# Patient Record
Sex: Male | Born: 1940
Health system: Southern US, Community
[De-identification: ages and names within clinical notes are randomized; demographics above are authoritative.]

## PROBLEM LIST (undated history)

## (undated) DIAGNOSIS — I10 Essential (primary) hypertension: Secondary | ICD-10-CM

## (undated) DIAGNOSIS — R7303 Prediabetes: Secondary | ICD-10-CM

## (undated) DIAGNOSIS — Z973 Presence of spectacles and contact lenses: Secondary | ICD-10-CM

## (undated) DIAGNOSIS — Z952 Presence of prosthetic heart valve: Secondary | ICD-10-CM

## (undated) DIAGNOSIS — Q231 Congenital insufficiency of aortic valve: Secondary | ICD-10-CM

## (undated) DIAGNOSIS — E785 Hyperlipidemia, unspecified: Secondary | ICD-10-CM

## (undated) DIAGNOSIS — C61 Malignant neoplasm of prostate: Secondary | ICD-10-CM

## (undated) DIAGNOSIS — Z972 Presence of dental prosthetic device (complete) (partial): Secondary | ICD-10-CM

## (undated) DIAGNOSIS — R238 Other skin changes: Secondary | ICD-10-CM

## (undated) DIAGNOSIS — I351 Nonrheumatic aortic (valve) insufficiency: Secondary | ICD-10-CM

## (undated) DIAGNOSIS — J189 Pneumonia, unspecified organism: Secondary | ICD-10-CM

## (undated) DIAGNOSIS — I2510 Atherosclerotic heart disease of native coronary artery without angina pectoris: Secondary | ICD-10-CM

## (undated) DIAGNOSIS — Z87898 Personal history of other specified conditions: Secondary | ICD-10-CM

## (undated) DIAGNOSIS — F419 Anxiety disorder, unspecified: Secondary | ICD-10-CM

## (undated) DIAGNOSIS — N400 Enlarged prostate without lower urinary tract symptoms: Secondary | ICD-10-CM

## (undated) DIAGNOSIS — N183 Chronic kidney disease, stage 3 unspecified: Secondary | ICD-10-CM

## (undated) DIAGNOSIS — I5032 Chronic diastolic (congestive) heart failure: Secondary | ICD-10-CM

## (undated) DIAGNOSIS — M549 Dorsalgia, unspecified: Secondary | ICD-10-CM

## (undated) DIAGNOSIS — I440 Atrioventricular block, first degree: Secondary | ICD-10-CM

## (undated) DIAGNOSIS — Z9889 Other specified postprocedural states: Secondary | ICD-10-CM

## (undated) DIAGNOSIS — I350 Nonrheumatic aortic (valve) stenosis: Secondary | ICD-10-CM

## (undated) DIAGNOSIS — Z86711 Personal history of pulmonary embolism: Secondary | ICD-10-CM

## (undated) DIAGNOSIS — R351 Nocturia: Secondary | ICD-10-CM

## (undated) DIAGNOSIS — M1990 Unspecified osteoarthritis, unspecified site: Secondary | ICD-10-CM

## (undated) DIAGNOSIS — R112 Nausea with vomiting, unspecified: Secondary | ICD-10-CM

## (undated) DIAGNOSIS — R05 Cough: Secondary | ICD-10-CM

## (undated) DIAGNOSIS — R51 Headache: Secondary | ICD-10-CM

## (undated) HISTORY — PX: COLONOSCOPY: SHX174

## (undated) HISTORY — DX: Hyperlipidemia, unspecified: E78.5

## (undated) HISTORY — PX: BLEPHAROPLASTY: SUR158

## (undated) HISTORY — PX: CARDIAC CATHETERIZATION: SHX172

## (undated) HISTORY — PX: AORTIC VALVE REPLACEMENT (AVR)/CORONARY ARTERY BYPASS GRAFTING (CABG): SHX5725

## (undated) HISTORY — PX: ESOPHAGOGASTRODUODENOSCOPY: SHX1529

## (undated) HISTORY — DX: Congenital insufficiency of aortic valve: Q23.1

## (undated) HISTORY — DX: Essential (primary) hypertension: I10

## (undated) HISTORY — DX: Nonrheumatic aortic (valve) insufficiency: I35.1

## (undated) HISTORY — PX: AORTIC VALVE REPLACEMENT: SHX41

## (undated) HISTORY — DX: Nonrheumatic aortic (valve) stenosis: I35.0

## (undated) HISTORY — PX: OTHER SURGICAL HISTORY: SHX169

## (undated) HISTORY — PX: CYST REMOVAL NECK: SHX6281

---

## 1998-07-04 ENCOUNTER — Ambulatory Visit (HOSPITAL_COMMUNITY): Admission: RE | Admit: 1998-07-04 | Discharge: 1998-07-04 | Payer: Self-pay | Admitting: *Deleted

## 1999-09-17 ENCOUNTER — Emergency Department (HOSPITAL_COMMUNITY): Admission: EM | Admit: 1999-09-17 | Discharge: 1999-09-17 | Payer: Self-pay | Admitting: Emergency Medicine

## 2000-09-12 ENCOUNTER — Encounter: Payer: Self-pay | Admitting: Urology

## 2000-09-12 ENCOUNTER — Encounter: Admission: RE | Admit: 2000-09-12 | Discharge: 2000-09-12 | Payer: Self-pay | Admitting: Urology

## 2001-06-22 ENCOUNTER — Ambulatory Visit (HOSPITAL_COMMUNITY): Admission: RE | Admit: 2001-06-22 | Discharge: 2001-06-22 | Payer: Self-pay | Admitting: Orthopedic Surgery

## 2001-06-22 ENCOUNTER — Encounter: Payer: Self-pay | Admitting: Orthopedic Surgery

## 2002-04-03 ENCOUNTER — Encounter: Payer: Self-pay | Admitting: Pulmonary Disease

## 2002-04-03 ENCOUNTER — Ambulatory Visit (HOSPITAL_COMMUNITY): Admission: RE | Admit: 2002-04-03 | Discharge: 2002-04-03 | Payer: Self-pay | Admitting: Pulmonary Disease

## 2003-08-15 ENCOUNTER — Ambulatory Visit (HOSPITAL_COMMUNITY): Admission: RE | Admit: 2003-08-15 | Discharge: 2003-08-15 | Payer: Self-pay | Admitting: Pulmonary Disease

## 2004-09-22 ENCOUNTER — Emergency Department (HOSPITAL_COMMUNITY): Admission: EM | Admit: 2004-09-22 | Discharge: 2004-09-23 | Payer: Self-pay | Admitting: Emergency Medicine

## 2005-02-05 ENCOUNTER — Ambulatory Visit (HOSPITAL_COMMUNITY): Admission: RE | Admit: 2005-02-05 | Discharge: 2005-02-05 | Payer: Self-pay | Admitting: Pulmonary Disease

## 2006-07-06 ENCOUNTER — Emergency Department (HOSPITAL_COMMUNITY): Admission: EM | Admit: 2006-07-06 | Discharge: 2006-07-06 | Payer: Self-pay | Admitting: Emergency Medicine

## 2009-02-06 ENCOUNTER — Ambulatory Visit (HOSPITAL_COMMUNITY): Admission: RE | Admit: 2009-02-06 | Discharge: 2009-02-06 | Payer: Self-pay | Admitting: Pulmonary Disease

## 2009-11-06 ENCOUNTER — Ambulatory Visit (HOSPITAL_COMMUNITY): Admission: RE | Admit: 2009-11-06 | Discharge: 2009-11-06 | Payer: Self-pay | Admitting: Pulmonary Disease

## 2010-06-29 ENCOUNTER — Ambulatory Visit (HOSPITAL_COMMUNITY)
Admission: RE | Admit: 2010-06-29 | Discharge: 2010-06-29 | Disposition: A | Discharge status: Home / Self Care | Payer: BC Managed Care – PPO | Source: Ambulatory Visit | Attending: Pulmonary Disease | Admitting: Pulmonary Disease

## 2010-06-29 ENCOUNTER — Other Ambulatory Visit: Payer: Self-pay | Admitting: Pulmonary Disease

## 2010-06-29 DIAGNOSIS — R52 Pain, unspecified: Secondary | ICD-10-CM

## 2010-06-29 DIAGNOSIS — M549 Dorsalgia, unspecified: Secondary | ICD-10-CM | POA: Insufficient documentation

## 2010-06-29 DIAGNOSIS — M51379 Other intervertebral disc degeneration, lumbosacral region without mention of lumbar back pain or lower extremity pain: Secondary | ICD-10-CM | POA: Insufficient documentation

## 2010-06-29 DIAGNOSIS — M5137 Other intervertebral disc degeneration, lumbosacral region: Secondary | ICD-10-CM | POA: Insufficient documentation

## 2010-07-12 ENCOUNTER — Emergency Department (HOSPITAL_COMMUNITY)
Admission: EM | Admit: 2010-07-12 | Discharge: 2010-07-12 | Disposition: A | Discharge status: Home / Self Care | Payer: Medicare Other | Attending: Emergency Medicine | Admitting: Emergency Medicine

## 2010-07-12 ENCOUNTER — Emergency Department (HOSPITAL_COMMUNITY): Payer: Medicare Other

## 2010-07-12 DIAGNOSIS — R51 Headache: Secondary | ICD-10-CM | POA: Insufficient documentation

## 2010-07-12 DIAGNOSIS — I10 Essential (primary) hypertension: Secondary | ICD-10-CM | POA: Insufficient documentation

## 2010-07-12 DIAGNOSIS — S0990XA Unspecified injury of head, initial encounter: Secondary | ICD-10-CM | POA: Insufficient documentation

## 2010-07-12 DIAGNOSIS — W108XXA Fall (on) (from) other stairs and steps, initial encounter: Secondary | ICD-10-CM | POA: Insufficient documentation

## 2010-07-12 DIAGNOSIS — M549 Dorsalgia, unspecified: Secondary | ICD-10-CM | POA: Insufficient documentation

## 2010-07-12 DIAGNOSIS — E785 Hyperlipidemia, unspecified: Secondary | ICD-10-CM | POA: Insufficient documentation

## 2010-07-12 DIAGNOSIS — Y92009 Unspecified place in unspecified non-institutional (private) residence as the place of occurrence of the external cause: Secondary | ICD-10-CM | POA: Insufficient documentation

## 2010-08-07 ENCOUNTER — Other Ambulatory Visit: Payer: Self-pay | Admitting: Orthopedic Surgery

## 2010-08-07 DIAGNOSIS — M549 Dorsalgia, unspecified: Secondary | ICD-10-CM

## 2010-08-10 ENCOUNTER — Ambulatory Visit
Admission: RE | Admit: 2010-08-10 | Discharge: 2010-08-10 | Disposition: A | Discharge status: Home / Self Care | Payer: Medicare Other | Source: Ambulatory Visit | Attending: Orthopedic Surgery | Admitting: Orthopedic Surgery

## 2010-08-10 DIAGNOSIS — M549 Dorsalgia, unspecified: Secondary | ICD-10-CM

## 2011-05-20 DIAGNOSIS — N401 Enlarged prostate with lower urinary tract symptoms: Secondary | ICD-10-CM | POA: Diagnosis not present

## 2011-05-20 DIAGNOSIS — N138 Other obstructive and reflux uropathy: Secondary | ICD-10-CM | POA: Diagnosis not present

## 2011-05-20 DIAGNOSIS — N529 Male erectile dysfunction, unspecified: Secondary | ICD-10-CM | POA: Diagnosis not present

## 2011-05-20 DIAGNOSIS — N498 Inflammatory disorders of other specified male genital organs: Secondary | ICD-10-CM | POA: Diagnosis not present

## 2011-05-24 DIAGNOSIS — I119 Hypertensive heart disease without heart failure: Secondary | ICD-10-CM | POA: Diagnosis not present

## 2011-05-24 DIAGNOSIS — Q251 Coarctation of aorta: Secondary | ICD-10-CM | POA: Diagnosis not present

## 2011-05-24 DIAGNOSIS — E7800 Pure hypercholesterolemia, unspecified: Secondary | ICD-10-CM | POA: Diagnosis not present

## 2011-05-24 DIAGNOSIS — Z23 Encounter for immunization: Secondary | ICD-10-CM | POA: Diagnosis not present

## 2011-06-11 DIAGNOSIS — M542 Cervicalgia: Secondary | ICD-10-CM | POA: Diagnosis not present

## 2011-06-11 DIAGNOSIS — M999 Biomechanical lesion, unspecified: Secondary | ICD-10-CM | POA: Diagnosis not present

## 2011-06-11 DIAGNOSIS — S239XXA Sprain of unspecified parts of thorax, initial encounter: Secondary | ICD-10-CM | POA: Diagnosis not present

## 2011-06-11 DIAGNOSIS — M9981 Other biomechanical lesions of cervical region: Secondary | ICD-10-CM | POA: Diagnosis not present

## 2011-06-14 ENCOUNTER — Other Ambulatory Visit (HOSPITAL_COMMUNITY): Payer: Self-pay | Admitting: Pulmonary Disease

## 2011-06-14 DIAGNOSIS — E7800 Pure hypercholesterolemia, unspecified: Secondary | ICD-10-CM | POA: Diagnosis not present

## 2011-06-14 DIAGNOSIS — I119 Hypertensive heart disease without heart failure: Secondary | ICD-10-CM | POA: Diagnosis not present

## 2011-06-14 DIAGNOSIS — Z79899 Other long term (current) drug therapy: Secondary | ICD-10-CM | POA: Diagnosis not present

## 2011-06-14 DIAGNOSIS — R1012 Left upper quadrant pain: Secondary | ICD-10-CM | POA: Diagnosis not present

## 2011-06-14 DIAGNOSIS — N400 Enlarged prostate without lower urinary tract symptoms: Secondary | ICD-10-CM

## 2011-06-14 DIAGNOSIS — R39198 Other difficulties with micturition: Secondary | ICD-10-CM | POA: Diagnosis not present

## 2011-06-14 DIAGNOSIS — M5137 Other intervertebral disc degeneration, lumbosacral region: Secondary | ICD-10-CM | POA: Diagnosis not present

## 2011-06-16 ENCOUNTER — Ambulatory Visit (HOSPITAL_COMMUNITY)
Admission: RE | Admit: 2011-06-16 | Discharge: 2011-06-16 | Disposition: A | Discharge status: Home / Self Care | Payer: Medicare Other | Source: Ambulatory Visit | Attending: Pulmonary Disease | Admitting: Pulmonary Disease

## 2011-06-16 ENCOUNTER — Encounter (HOSPITAL_COMMUNITY): Payer: Self-pay

## 2011-06-16 DIAGNOSIS — R1012 Left upper quadrant pain: Secondary | ICD-10-CM | POA: Diagnosis not present

## 2011-06-16 DIAGNOSIS — R109 Unspecified abdominal pain: Secondary | ICD-10-CM | POA: Diagnosis not present

## 2011-06-16 DIAGNOSIS — N400 Enlarged prostate without lower urinary tract symptoms: Secondary | ICD-10-CM | POA: Insufficient documentation

## 2011-06-16 MED ORDER — IOHEXOL 300 MG/ML  SOLN
75.0000 mL | Freq: Once | INTRAMUSCULAR | Status: AC | PRN
  Administered 2011-06-16: 75 mL via INTRAVENOUS

## 2011-06-18 DIAGNOSIS — M5412 Radiculopathy, cervical region: Secondary | ICD-10-CM | POA: Diagnosis not present

## 2011-06-18 DIAGNOSIS — M999 Biomechanical lesion, unspecified: Secondary | ICD-10-CM | POA: Diagnosis not present

## 2011-06-18 DIAGNOSIS — M9981 Other biomechanical lesions of cervical region: Secondary | ICD-10-CM | POA: Diagnosis not present

## 2011-06-18 DIAGNOSIS — M5137 Other intervertebral disc degeneration, lumbosacral region: Secondary | ICD-10-CM | POA: Diagnosis not present

## 2011-06-25 DIAGNOSIS — M5137 Other intervertebral disc degeneration, lumbosacral region: Secondary | ICD-10-CM | POA: Diagnosis not present

## 2011-06-25 DIAGNOSIS — M5412 Radiculopathy, cervical region: Secondary | ICD-10-CM | POA: Diagnosis not present

## 2011-06-25 DIAGNOSIS — M999 Biomechanical lesion, unspecified: Secondary | ICD-10-CM | POA: Diagnosis not present

## 2011-06-25 DIAGNOSIS — M9981 Other biomechanical lesions of cervical region: Secondary | ICD-10-CM | POA: Diagnosis not present

## 2011-07-13 DIAGNOSIS — I119 Hypertensive heart disease without heart failure: Secondary | ICD-10-CM | POA: Diagnosis not present

## 2011-07-13 DIAGNOSIS — S139XXA Sprain of joints and ligaments of unspecified parts of neck, initial encounter: Secondary | ICD-10-CM | POA: Diagnosis not present

## 2011-07-13 DIAGNOSIS — Z043 Encounter for examination and observation following other accident: Secondary | ICD-10-CM | POA: Diagnosis not present

## 2011-07-13 DIAGNOSIS — Z79899 Other long term (current) drug therapy: Secondary | ICD-10-CM | POA: Diagnosis not present

## 2011-07-14 DIAGNOSIS — I359 Nonrheumatic aortic valve disorder, unspecified: Secondary | ICD-10-CM | POA: Diagnosis not present

## 2011-07-21 DIAGNOSIS — E7800 Pure hypercholesterolemia, unspecified: Secondary | ICD-10-CM | POA: Diagnosis not present

## 2011-07-21 DIAGNOSIS — I359 Nonrheumatic aortic valve disorder, unspecified: Secondary | ICD-10-CM | POA: Diagnosis not present

## 2011-07-21 DIAGNOSIS — I059 Rheumatic mitral valve disease, unspecified: Secondary | ICD-10-CM | POA: Diagnosis not present

## 2011-07-21 DIAGNOSIS — I10 Essential (primary) hypertension: Secondary | ICD-10-CM | POA: Diagnosis not present

## 2011-07-22 ENCOUNTER — Other Ambulatory Visit (HOSPITAL_COMMUNITY): Payer: Self-pay | Admitting: Pulmonary Disease

## 2011-07-22 ENCOUNTER — Ambulatory Visit (HOSPITAL_COMMUNITY)
Admission: RE | Admit: 2011-07-22 | Discharge: 2011-07-22 | Disposition: A | Discharge status: Home / Self Care | Payer: Medicare Other | Source: Ambulatory Visit | Attending: Pulmonary Disease | Admitting: Pulmonary Disease

## 2011-07-22 DIAGNOSIS — R52 Pain, unspecified: Secondary | ICD-10-CM | POA: Diagnosis not present

## 2011-07-22 DIAGNOSIS — M5030 Other cervical disc degeneration, unspecified cervical region: Secondary | ICD-10-CM | POA: Diagnosis not present

## 2011-07-22 DIAGNOSIS — M47812 Spondylosis without myelopathy or radiculopathy, cervical region: Secondary | ICD-10-CM | POA: Insufficient documentation

## 2011-07-29 DIAGNOSIS — H04129 Dry eye syndrome of unspecified lacrimal gland: Secondary | ICD-10-CM | POA: Diagnosis not present

## 2011-08-11 ENCOUNTER — Other Ambulatory Visit: Payer: Self-pay | Admitting: Interventional Cardiology

## 2011-08-13 ENCOUNTER — Other Ambulatory Visit: Payer: Self-pay | Admitting: Cardiology

## 2011-08-13 DIAGNOSIS — I359 Nonrheumatic aortic valve disorder, unspecified: Secondary | ICD-10-CM | POA: Diagnosis not present

## 2011-08-13 DIAGNOSIS — E785 Hyperlipidemia, unspecified: Secondary | ICD-10-CM | POA: Diagnosis not present

## 2011-08-13 DIAGNOSIS — I10 Essential (primary) hypertension: Secondary | ICD-10-CM | POA: Diagnosis not present
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
## 2011-08-19 ENCOUNTER — Encounter (HOSPITAL_BASED_OUTPATIENT_CLINIC_OR_DEPARTMENT_OTHER)
Admission: RE | Disposition: A | Discharge status: Home / Self Care | Payer: Self-pay | Source: Ambulatory Visit | Attending: Interventional Cardiology

## 2011-08-19 ENCOUNTER — Inpatient Hospital Stay (HOSPITAL_BASED_OUTPATIENT_CLINIC_OR_DEPARTMENT_OTHER)
Admission: RE | Admit: 2011-08-19 | Discharge: 2011-08-19 | Disposition: A | Discharge status: Home / Self Care | Payer: Medicare Other | Source: Ambulatory Visit | Attending: Interventional Cardiology | Admitting: Interventional Cardiology

## 2011-08-19 DIAGNOSIS — I359 Nonrheumatic aortic valve disorder, unspecified: Secondary | ICD-10-CM | POA: Diagnosis not present

## 2011-08-19 DIAGNOSIS — I2510 Atherosclerotic heart disease of native coronary artery without angina pectoris: Secondary | ICD-10-CM | POA: Diagnosis not present

## 2011-08-19 LAB — POCT I-STAT 3, VENOUS BLOOD GAS (G3P V)
Bicarbonate: 27.6 mEq/L — ABNORMAL HIGH (ref 20.0–24.0)
pCO2, Ven: 54.2 mmHg — ABNORMAL HIGH (ref 45.0–50.0)
pH, Ven: 7.315 — ABNORMAL HIGH (ref 7.250–7.300)

## 2011-08-19 LAB — POCT I-STAT 3, ART BLOOD GAS (G3+)
pCO2 arterial: 52.5 mmHg — ABNORMAL HIGH (ref 35.0–45.0)
pO2, Arterial: 115 mmHg — ABNORMAL HIGH (ref 80.0–100.0)

## 2011-08-19 SURGERY — JV LEFT AND RIGHT HEART CATHETERIZATION WITH CORONARY ANGIOGRAM
Anesthesia: Moderate Sedation

## 2011-08-19 MED ORDER — SODIUM CHLORIDE 0.9 % IJ SOLN: 3.0000 mL | Freq: Two times a day (BID) | INTRAMUSCULAR | Status: DC

## 2011-08-19 MED ORDER — SODIUM CHLORIDE 0.9 % IV SOLN: 250.0000 mL | INTRAVENOUS | Status: DC | PRN

## 2011-08-19 MED ORDER — SODIUM CHLORIDE 0.9 % IV SOLN: INTRAVENOUS | Status: DC

## 2011-08-19 MED ORDER — ASPIRIN 81 MG PO CHEW
324.0000 mg | CHEWABLE_TABLET | ORAL | Status: AC
  Administered 2011-08-19: 324 mg via ORAL

## 2011-08-19 MED ORDER — ACETAMINOPHEN 325 MG PO TABS: 650.0000 mg | ORAL_TABLET | ORAL | Status: DC | PRN

## 2011-08-19 MED ORDER — SODIUM CHLORIDE 0.9 % IV SOLN
INTRAVENOUS | Status: DC
  Administered 2011-08-19: 08:00:00 via INTRAVENOUS

## 2011-08-19 MED ORDER — DIAZEPAM 5 MG PO TABS
5.0000 mg | ORAL_TABLET | ORAL | Status: AC
  Administered 2011-08-19: 5 mg via ORAL

## 2011-08-19 MED ORDER — SODIUM CHLORIDE 0.9 % IJ SOLN: 3.0000 mL | INTRAMUSCULAR | Status: DC | PRN

## 2011-08-19 MED ORDER — ONDANSETRON HCL 4 MG/2ML IJ SOLN: 4.0000 mg | Freq: Four times a day (QID) | INTRAMUSCULAR | Status: DC | PRN

## 2011-08-19 NOTE — H&P (Signed)
The patient has aortic stenosis that appears to have progressed based upon recent echo data and patient's clinical status. No changes have occurred since the history and physical was generated all 08/13/11. The procedure including the risk of stroke, death, myocardial infarction, kidney failure, bleeding, allergy, limb ischemia, among others were discussed and accepted by the patient.

## 2011-08-19 NOTE — CV Procedure (Signed)
     Diagnostic Cardiac Catheterization Report  [name]  71 y.o.  male Dec 22, 1940  Procedure Date:08/19/2011  Referring Physician: Corine Shelter, M.D. Primary Cardiologist:: Gwynneth Albright, M.D.   PROCEDURE:  Left and right heart catheterization with selective coronary angiography, left ventriculogram.  INDICATIONS:  Symptomatic aortic stenosis. The studies being done to define coronary anatomy prior to referral for consideration of surgery or TAVI. This study will also confirm the significance of the patient's aortic stenosis.  The risks, benefits, and details of the procedure were explained to the patient.  The patient verbalized understanding and wanted to proceed.  Informed written consent was obtained.  PROCEDURE TECHNIQUE:  After Xylocaine anesthesia a 5 French sheath was placed in the right femoral artery and a 7 French sheath in the right femoral vein. Both were entered with a single anterior needle wall stick.   Coronary angiography was done using a 4 French A2 MP and #4 left Judkins catheter.  Left ventriculography was done using a 4 [place] JR catheter. The aortic valve was crossed using a straight stiff 0.038 wire. This was done within the Judkins right catheter. An angled 6 French Swan-Ganz catheter was used to measure pressures in right heart chambers the pulmonary artery and capillary wedge positions. Both this thermodilution and Fick cardiac outputs were determined. A simultaneous left ventricular and wedge pressure was recorded.   CONTRAST:  Total of 60 cc.  COMPLICATIONS:  None.    HEMODYNAMICS:  Aortic pressure was 104/60 mmHg; LV pressure was 153/13 mmHg; LVEDP 18 mm mercury; right atrial pressure 8 mm mercury mean; right ventricular pressure 30/9 mmHg; main pulmonary artery pressure 28/10 mmHg; pulmonary capillary wedge pressure mean, 11mm mercury; cardiac output 4.3 L per minute (Fick), and 3.7 L per minute (Thermo); aortic valve gradient of 49 mm mercury  (peak-peak)and 39mm mercury (mean); aortic valve area 0.71 cm square (Fick) and 0.61 cm square (Thermo).  ANGIOGRAPHIC DATA:   The left main coronary artery is widely patent.  The left anterior descending artery is widely patent with transapical course. 2 diagonal branches are widely patent..  The left circumflex artery is mid vessel 50-75% tandem lesions before a large bifurcating second obtuse marginal.  The right coronary artery is mid vessel 30-50% stenosis. The vessel is dominant. No significant obstruction is seen.[name]  LEFT VENTRICULOGRAM:  Left ventricular angiogram was done in the 30 RAO projection and revealed normal left ventricular wall motion and systolic function with an estimated ejection fraction of 65%.  IMPRESSIONS:  1. Severe/critical aortic stenosis producing symptoms. Aortic valve area less than 0.75 cm square.  The valve is heavily calcified and has restricted motion on cinefluoroscopy .  2. Single-vessel coronary artery disease with 75% mid circumflex stenosis.  3. Normal left ventricular systolic function, EF 65%.    RECOMMENDATION:  Will refer for consideration of surgical valve replacement. He will likely also need coronary artery bypass grafting to the circumflex.

## 2011-08-19 NOTE — Progress Notes (Signed)
Discharge instructions completed.  Ambulated to bathroom via wheelchair to home with wife.

## 2011-08-19 NOTE — OR Nursing (Signed)
Tegaderm dressing applied, site level 0, bedrest begins at 0955 

## 2011-08-20 ENCOUNTER — Institutional Professional Consult (permissible substitution) (INDEPENDENT_AMBULATORY_CARE_PROVIDER_SITE_OTHER): Payer: Medicare Other | Admitting: Surgery

## 2011-08-20 ENCOUNTER — Encounter (HOSPITAL_COMMUNITY): Payer: Self-pay

## 2011-08-20 ENCOUNTER — Encounter: Payer: Self-pay | Admitting: Surgery

## 2011-08-20 ENCOUNTER — Other Ambulatory Visit: Payer: Self-pay

## 2011-08-20 ENCOUNTER — Other Ambulatory Visit: Payer: Self-pay | Admitting: Surgery

## 2011-08-20 ENCOUNTER — Other Ambulatory Visit (HOSPITAL_COMMUNITY): Payer: Self-pay | Admitting: Respiratory Therapy

## 2011-08-20 VITALS — BP 151/76 | HR 78 | Resp 16 | Ht 69.0 in | Wt 202.0 lb

## 2011-08-20 DIAGNOSIS — I10 Essential (primary) hypertension: Secondary | ICD-10-CM | POA: Insufficient documentation

## 2011-08-20 DIAGNOSIS — Q231 Congenital insufficiency of aortic valve: Secondary | ICD-10-CM

## 2011-08-20 DIAGNOSIS — I350 Nonrheumatic aortic (valve) stenosis: Secondary | ICD-10-CM

## 2011-08-20 DIAGNOSIS — I351 Nonrheumatic aortic (valve) insufficiency: Secondary | ICD-10-CM | POA: Insufficient documentation

## 2011-08-20 DIAGNOSIS — I359 Nonrheumatic aortic valve disorder, unspecified: Secondary | ICD-10-CM | POA: Diagnosis not present

## 2011-08-20 DIAGNOSIS — I2510 Atherosclerotic heart disease of native coronary artery without angina pectoris: Secondary | ICD-10-CM

## 2011-08-20 NOTE — Progress Notes (Signed)
                  [address]            [place] [address]          [phone]      PCP is George Kilpatrick, MD  Referring Provider is [name], Henry W III, MD  Chief Complaint   Patient presents with   .  Aortic Stenosis     Referral from Dr Henry [name] for eval on Aortic stenosis   HPI:  The patient is a 71-year-old gentleman with known aortic stenosis that has been followed by Dr. [name] with annual echocardiogram. During his most recent followup visit on 07/21/2011 he reported having some intermittent left anterior chest discomfort over the past year. He said this occurs with rest and with mild exertion and resolves without treatment. He had a followup echocardiogram on 07/14/2011 that showed progression of his aortic stenosis to severe with an aortic valve area of 0.65-0.73 with a mean gradient of 38 mm mercury and a peak gradient of 63 mm mercury. Left ventricular ejection fraction was 60-65%. There is moderate concentric left ventricular hypertrophy. There is mild to moderate mitral valve regurgitation. Aortic valve appeared to be bicuspid. There is mild aortic root dilatation with aortic root diameter measured at 3.7 cm. There is mild pulmonic and tricuspid regurgitation. He subsequently underwent cardiac catheterization in preparation for surgery which showed severe aortic stenosis with a 75% left circumflex stenosis and insignificant 30-50% right coronary stenosis.  Past Medical History   Diagnosis  Date   .  Hypertension    .  Hyperlipidemia    .  Aortic stenosis    .  Bicuspid aortic valve    .  Aortic valve regurgitation     Past Surgical History   Procedure  Date   .  Cyst removal, right side if patient's neck    .  Colonoscopy      2007   .  Eye lid lift both eyes     Family History   Problem  Relation  Age of Onset   .  Hypertension  Father       CABG at 81    .  Cancer  Mother    .  Hypertension  Sister    Social History  History     Substance Use Topics   .  Smoking status:  Never Smoker   .  Smokeless tobacco:  Never Used   .  Alcohol Use:  Yes      wine    Current Outpatient Prescriptions   Medication  Sig  Dispense  Refill   .  Choline Fenofibrate (TRILIPIX) 135 MG capsule  Take 135 mg by mouth daily.     .  losartan-hydrochlorothiazide (HYZAAR) 100-25 MG per tablet  Take 1 tablet by mouth daily.     .  Tamsulosin HCl (FLOMAX) 0.4 MG CAPS  Take 0.4 mg by mouth daily.     .  aspirin 81 MG tablet  Take 81 mg by mouth daily.     .  Cholecalciferol (VITAMIN D-3) 5000 UNITS TABS  Take by mouth 1 day or 1 dose.     .  fish oil-omega-3 fatty acids 1000 MG capsule  Take 1 g by mouth daily.     .  loratadine (CLARITIN) 10 MG tablet  Take 10 mg by mouth daily.     .  Multiple Vitamin (MULTIVITAMIN)   tablet  Take 1 tablet by mouth daily.     .  naproxen sodium (ANAPROX) 220 MG tablet  Take 220 mg by mouth as needed.      No current facility-administered medications for this visit.    Facility-Administered Medications Ordered in Other Visits   Medication  Dose  Route  Frequency  Provider  Last Rate  Last Dose   .  DISCONTD: 0.9 % sodium chloride infusion   Intravenous  Continuous  Henry W [name] III, MD  75 mL/hr at 08/19/11 0800    .  DISCONTD: 0.9 % sodium chloride infusion   Intravenous  Continuous  Henry W [name] III, MD     .  DISCONTD: acetaminophen (TYLENOL) tablet 650 mg  650 mg  Oral  Q4H PRN  Henry W [name] III, MD     .  DISCONTD: ondansetron (ZOFRAN) injection 4 mg  4 mg  Intravenous  Q6H PRN  Henry W [name] III, MD     No Known Allergies  Review of Systems  Constitutional: Positive for activity change and fatigue. Negative for chills, appetite change and unexpected weight change.  Less active over the past year due to back injury.  Tired and falls asleep easily after meals.  HENT: Negative.  Sees his dentist every 6 months.  Eyes: Negative.  Respiratory: Negative for cough and shortness of breath.   Cardiovascular: Positive for chest pain. Negative for palpitations and leg swelling.  Gastrointestinal: Negative.  Genitourinary: Negative.  Musculoskeletal: Positive for back pain.  Neurological: Negative for dizziness, syncope, weakness, light-headedness and headaches.  Hematological: Negative.  Psychiatric/Behavioral: Negative.  BP 151/76  Pulse 78  Resp 16  Ht 5' 9" (1.753 m)  Wt 202 lb (91.627 kg)  BMI 29.83 kg/m2  SpO2 97%  Physical Exam  Constitutional: He is oriented to person, place, and time. He appears well-developed and well-nourished.  HENT:  Head: Normocephalic and atraumatic.  Mouth/Throat: Oropharynx is clear and moist. Abnormal dentition.  Wears partial on bottom, denture on top.  Eyes: Conjunctivae and EOM are normal. Pupils are equal, round, and reactive to light.  Neck: Normal range of motion. Neck supple. No JVD present. No thyromegaly present.  Cardiovascular: Normal rate and regular rhythm. Exam reveals no gallop and no friction rub.  Murmur heard.  Systolic murmur is present with a grade of 3/6  Pulmonary/Chest: Breath sounds normal.  Abdominal: Bowel sounds are normal. He exhibits no mass. There is no tenderness.  Musculoskeletal: He exhibits no edema.  Lymphadenopathy:  He has no cervical adenopathy.  Neurological: He is alert and oriented to person, place, and time. He has normal strength. No cranial nerve deficit or sensory deficit.  Skin: Skin is warm and dry.  Psychiatric: He has a normal mood and affect.  Diagnostic Tests:  Cardiac Cath:  HEMODYNAMICS: Aortic pressure was 104/60 mmHg; LV pressure was 153/13 mmHg; LVEDP 18 mm mercury; right atrial pressure 8 mm mercury mean; right ventricular pressure 30/9 mmHg; main pulmonary artery pressure 28/10 mmHg; pulmonary capillary wedge pressure mean, 11mm mercury; cardiac output 4.3 L per minute (Fick), and 3.7 L per minute (Thermo); aortic valve gradient of 49 mm mercury (peak-peak)and 39mm mercury  (mean); aortic valve area 0.71 cm square (Fick) and 0.61 cm square (Thermo).  ANGIOGRAPHIC DATA: The left main coronary artery is widely patent.  The left anterior descending artery is widely patent with transapical course. 2 diagonal branches are widely patent..  The left circumflex artery is mid vessel 50-75% tandem lesions   before a large bifurcating second obtuse marginal.  The right coronary artery is mid vessel 30-50% stenosis. The vessel is dominant. No significant obstruction is seen..  LEFT VENTRICULOGRAM: Left ventricular angiogram was done in the 30 RAO projection and revealed normal left ventricular wall motion and systolic function with an estimated ejection fraction of 65%.  IMPRESSIONS: 1. Severe/critical aortic stenosis producing symptoms. Aortic valve area less than 0.75 cm square.  The valve is heavily calcified and has restricted motion on cinefluoroscopy .  2. Single-vessel coronary artery disease with 75% mid circumflex stenosis.  3. Normal left ventricular systolic function, EF 65%.  Impression:  The patient has severe aortic stenosis with a bicuspid aortic valve and significant left circumflex coronary stenosis. He is becoming more symptomatic and I agree it is time to proceed with surgery. His aortic root is mildly enlarged but not a level that would require replacement. I discussed the operative procedure of aortic valve replacement and coronary bypass to the left circumflex coronary artery with the patient and his wife and daughter. I discussed the pros and cons of mechanical and tissue valves and my recommendation for a tissue valve given his age. He is in agreement to use a tissue valve. I discussed the operative procedure with the patient and family including alternatives, benefits and risks; including but not limited to bleeding, blood transfusion, infection, stroke, myocardial infarction, graft failure, heart block requiring a permanent pacemaker, organ dysfunction, and  death. Anvith E Lopata understands and agrees to proceed.  Plan:  We have scheduled aortic valve replacement and coronary bypass surgery to the left circumflex coronary artery on Wednesday, 08/01/2011.  

## 2011-08-30 ENCOUNTER — Ambulatory Visit (HOSPITAL_COMMUNITY)
Admission: RE | Admit: 2011-08-30 | Discharge: 2011-08-30 | Disposition: A | Discharge status: Home / Self Care | Payer: Medicare Other | Source: Ambulatory Visit | Attending: Surgery | Admitting: Surgery

## 2011-08-30 ENCOUNTER — Encounter (HOSPITAL_COMMUNITY)
Admission: RE | Admit: 2011-08-30 | Discharge: 2011-08-30 | Disposition: A | Discharge status: Home / Self Care | Payer: Medicare Other | Source: Ambulatory Visit | Attending: Surgery | Admitting: Surgery

## 2011-08-30 ENCOUNTER — Encounter (HOSPITAL_COMMUNITY): Payer: Self-pay

## 2011-08-30 VITALS — BP 110/77 | HR 64 | Temp 97.2°F | Resp 20 | Ht 70.0 in | Wt 201.3 lb

## 2011-08-30 DIAGNOSIS — Q231 Congenital insufficiency of aortic valve: Secondary | ICD-10-CM

## 2011-08-30 DIAGNOSIS — I2510 Atherosclerotic heart disease of native coronary artery without angina pectoris: Secondary | ICD-10-CM

## 2011-08-30 DIAGNOSIS — Q2381 Bicuspid aortic valve: Secondary | ICD-10-CM

## 2011-08-30 DIAGNOSIS — I359 Nonrheumatic aortic valve disorder, unspecified: Secondary | ICD-10-CM | POA: Diagnosis not present

## 2011-08-30 DIAGNOSIS — Z01818 Encounter for other preprocedural examination: Secondary | ICD-10-CM | POA: Insufficient documentation

## 2011-08-30 DIAGNOSIS — D62 Acute posthemorrhagic anemia: Secondary | ICD-10-CM | POA: Diagnosis not present

## 2011-08-30 DIAGNOSIS — Z01810 Encounter for preprocedural cardiovascular examination: Secondary | ICD-10-CM | POA: Diagnosis not present

## 2011-08-30 DIAGNOSIS — I5033 Acute on chronic diastolic (congestive) heart failure: Secondary | ICD-10-CM | POA: Diagnosis not present

## 2011-08-30 DIAGNOSIS — Z01812 Encounter for preprocedural laboratory examination: Secondary | ICD-10-CM | POA: Insufficient documentation

## 2011-08-30 DIAGNOSIS — I2789 Other specified pulmonary heart diseases: Secondary | ICD-10-CM | POA: Diagnosis not present

## 2011-08-30 DIAGNOSIS — I350 Nonrheumatic aortic (valve) stenosis: Secondary | ICD-10-CM

## 2011-08-30 HISTORY — DX: Other specified postprocedural states: Z98.890

## 2011-08-30 HISTORY — DX: Dorsalgia, unspecified: M54.9

## 2011-08-30 HISTORY — DX: Pneumonia, unspecified organism: J18.9

## 2011-08-30 HISTORY — DX: Headache: R51

## 2011-08-30 HISTORY — DX: Cough: R05

## 2011-08-30 HISTORY — DX: Other skin changes: R23.8

## 2011-08-30 HISTORY — DX: Nonrheumatic aortic (valve) insufficiency: I35.1

## 2011-08-30 HISTORY — DX: Anxiety disorder, unspecified: F41.9

## 2011-08-30 HISTORY — DX: Nausea with vomiting, unspecified: R11.2

## 2011-08-30 HISTORY — DX: Nocturia: R35.1

## 2011-08-30 HISTORY — DX: Atherosclerotic heart disease of native coronary artery without angina pectoris: I25.10

## 2011-08-30 HISTORY — DX: Benign prostatic hyperplasia without lower urinary tract symptoms: N40.0

## 2011-08-30 LAB — PULMONARY FUNCTION TEST

## 2011-08-30 LAB — URINALYSIS, ROUTINE W REFLEX MICROSCOPIC
Hgb urine dipstick: NEGATIVE
Nitrite: NEGATIVE
Protein, ur: NEGATIVE mg/dL
Urobilinogen, UA: 0.2 mg/dL (ref 0.0–1.0)

## 2011-08-30 LAB — CBC
HCT: 40.4 % (ref 39.0–52.0)
RDW: 13.6 % (ref 11.5–15.5)
WBC: 3.9 10*3/uL — ABNORMAL LOW (ref 4.0–10.5)

## 2011-08-30 LAB — COMPREHENSIVE METABOLIC PANEL
AST: 31 U/L (ref 0–37)
Albumin: 4 g/dL (ref 3.5–5.2)
Alkaline Phosphatase: 39 U/L (ref 39–117)
BUN: 17 mg/dL (ref 6–23)
Chloride: 99 mEq/L (ref 96–112)
Potassium: 3.6 mEq/L (ref 3.5–5.1)
Total Bilirubin: 0.4 mg/dL (ref 0.3–1.2)

## 2011-08-30 LAB — ABO/RH: ABO/RH(D): B POS

## 2011-08-30 LAB — BLOOD GAS, ARTERIAL
Bicarbonate: 29 mEq/L — ABNORMAL HIGH (ref 20.0–24.0)
TCO2: 30.4 mmol/L (ref 0–100)
pCO2 arterial: 44.9 mmHg (ref 35.0–45.0)
pH, Arterial: 7.427 (ref 7.350–7.450)
pO2, Arterial: 71.8 mmHg — ABNORMAL LOW (ref 80.0–100.0)

## 2011-08-30 LAB — PROTIME-INR: Prothrombin Time: 15.1 seconds (ref 11.6–15.2)

## 2011-08-30 LAB — SURGICAL PCR SCREEN: MRSA, PCR: NEGATIVE

## 2011-08-30 MED ORDER — CHLORHEXIDINE GLUCONATE 4 % EX LIQD: 30.0000 mL | CUTANEOUS | Status: DC

## 2011-08-30 NOTE — Progress Notes (Signed)
PFT confirmed were done at 0900 Dopplers confirmed done at 1000

## 2011-08-30 NOTE — Pre-Procedure Instructions (Signed)
20 DRAYCE TAWIL  08/30/2011   Your procedure is scheduled on:  Thurs, April 25 @ 0730  Report to [facility] at 0530 AM.  Call this number if you have problems the morning of surgery: [phone]   Remember:   Do not eat food:After Midnight.  May have clear liquids: up to 4 Hours before arrival.(until 1:30 am)  Clear liquids include soda, tea, black coffee, apple or grape juice, broth,water  Take these medicines the morning of surgery with A SIP OF WATER: Flomax   Do not wear jewelry  Do not wear lotions, powders, or perfumes.   Do not bring valuables to the hospital.  Contacts, dentures or bridgework may not be worn into surgery.  Leave suitcase in the car. After surgery it may be brought to your room.  For patients admitted to the hospital, checkout time is 11:00 AM the day of discharge.   Special Instructions: Incentive Spirometry - Practice and bring it with you on the day of surgery. and CHG Shower Use Special Wash: 1/2 bottle night before surgery and 1/2 bottle morning of surgery.   Please read over the following fact sheets that you were given: Pain Booklet, Coughing and Deep Breathing, Blood Transfusion Information, Open Heart Packet, MRSA Information and Surgical Site Infection Prevention

## 2011-08-30 NOTE — Progress Notes (Signed)
Dr.henry Katrinka Blazing is cardiologist and last visit a month ago  Heart cath in epic from 4/11/ Stress test done about 46yrs ago-to request report Echo done 3/13 to request report  Medical Md is Dr.George Petra Kuba

## 2011-08-30 NOTE — Progress Notes (Signed)
Pre-op Cardiac Surgery  Carotid Findings:  No ICA stenosis.  Vertebral artery flow is antegrade.  Upper Extremity Right Left  Brachial Pressures 127B 121B  Radial Waveforms B B  Ulnar Waveforms B B  Palmar Arch (Allen's Test) Doppler signal obliterates with radial compression and remains normal with ulnar compression. Doppler signal remains normal with radial compression and obliterates with ulnar compression.   Findings:      Lower  Extremity Right Left  Dorsalis Pedis    Anterior Tibial    Posterior Tibial    Ankle/Brachial Indices      Findings:   Palpable

## 2011-09-01 MED ORDER — TRANEXAMIC ACID (OHS) BOLUS VIA INFUSION
15.0000 mg/kg | INTRAVENOUS | Status: AC
  Administered 2011-09-02: 1369.5 mg via INTRAVENOUS
  Filled 2011-09-01: qty 1370

## 2011-09-01 MED ORDER — SODIUM CHLORIDE 0.9 % IV SOLN
INTRAVENOUS | Status: AC
  Administered 2011-09-02: 1 [IU]/h via INTRAVENOUS
  Filled 2011-09-01: qty 1

## 2011-09-01 MED ORDER — NITROGLYCERIN IN D5W 200-5 MCG/ML-% IV SOLN
2.0000 ug/min | INTRAVENOUS | Status: AC
  Administered 2011-09-02: 5 ug/min via INTRAVENOUS
  Filled 2011-09-01: qty 250

## 2011-09-01 MED ORDER — SODIUM CHLORIDE 0.9 % IV SOLN
1.5000 mg/kg/h | INTRAVENOUS | Status: AC
  Administered 2011-09-02: 1.5 mg/kg/h via INTRAVENOUS
  Filled 2011-09-01: qty 25

## 2011-09-01 MED ORDER — DOPAMINE-DEXTROSE 3.2-5 MG/ML-% IV SOLN
2.0000 ug/kg/min | INTRAVENOUS | Status: DC
  Filled 2011-09-01: qty 250

## 2011-09-01 MED ORDER — POTASSIUM CHLORIDE 2 MEQ/ML IV SOLN
80.0000 meq | INTRAVENOUS | Status: DC
  Filled 2011-09-01: qty 40

## 2011-09-01 MED ORDER — SODIUM BICARBONATE 8.4 % IV SOLN
INTRAVENOUS | Status: AC
  Administered 2011-09-02: 10:00:00
  Filled 2011-09-01: qty 2.5

## 2011-09-01 MED ORDER — VANCOMYCIN HCL 1000 MG IV SOLR
1500.0000 mg | INTRAVENOUS | Status: AC
  Administered 2011-09-02: 1500 mg via INTRAVENOUS
  Filled 2011-09-01: qty 1500

## 2011-09-01 MED ORDER — SODIUM CHLORIDE 0.9 % IV SOLN
0.1000 ug/kg/h | INTRAVENOUS | Status: AC
  Administered 2011-09-02: .2 ug/kg/h via INTRAVENOUS
  Filled 2011-09-01: qty 4

## 2011-09-01 MED ORDER — EPINEPHRINE HCL 1 MG/ML IJ SOLN
0.5000 ug/min | INTRAVENOUS | Status: DC
  Filled 2011-09-01: qty 4

## 2011-09-01 MED ORDER — DEXTROSE 5 % IV SOLN
1.5000 g | INTRAVENOUS | Status: AC
  Administered 2011-09-02: .75 g via INTRAVENOUS
  Administered 2011-09-02: 1.5 g via INTRAVENOUS
  Filled 2011-09-01: qty 1.5

## 2011-09-01 MED ORDER — DEXTROSE 5 % IV SOLN
30.0000 ug/min | INTRAVENOUS | Status: AC
  Administered 2011-09-02: 50 ug/min via INTRAVENOUS
  Filled 2011-09-01: qty 2

## 2011-09-01 MED ORDER — TRANEXAMIC ACID (OHS) PUMP PRIME SOLUTION
2.0000 mg/kg | INTRAVENOUS | Status: DC
  Filled 2011-09-01: qty 1.83

## 2011-09-01 MED ORDER — MAGNESIUM SULFATE 50 % IJ SOLN
40.0000 meq | INTRAMUSCULAR | Status: DC
  Filled 2011-09-01: qty 10

## 2011-09-01 MED ORDER — DEXTROSE 5 % IV SOLN
750.0000 mg | INTRAVENOUS | Status: DC
  Filled 2011-09-01: qty 750

## 2011-09-01 MED ORDER — METOPROLOL TARTRATE 12.5 MG HALF TABLET
12.5000 mg | ORAL_TABLET | Freq: Once | ORAL | Status: AC
  Administered 2011-09-02: 12.5 mg via ORAL
  Filled 2011-09-01: qty 1

## 2011-09-02 ENCOUNTER — Ambulatory Visit (HOSPITAL_COMMUNITY): Payer: Medicare Other | Admitting: Certified Registered Nurse Anesthetist

## 2011-09-02 ENCOUNTER — Encounter (HOSPITAL_COMMUNITY): Payer: Self-pay | Admitting: Certified Registered Nurse Anesthetist

## 2011-09-02 ENCOUNTER — Inpatient Hospital Stay (HOSPITAL_COMMUNITY)
Admission: RE | Admit: 2011-09-02 | Discharge: 2011-09-08 | DRG: 219 | Disposition: A | Discharge status: Home Health | Payer: Medicare Other | Source: Ambulatory Visit | Attending: Surgery | Admitting: Surgery

## 2011-09-02 ENCOUNTER — Inpatient Hospital Stay (HOSPITAL_COMMUNITY): Payer: Medicare Other

## 2011-09-02 ENCOUNTER — Encounter (HOSPITAL_COMMUNITY)
Admission: RE | Disposition: A | Discharge status: Home Health | Payer: Self-pay | Source: Ambulatory Visit | Attending: Surgery

## 2011-09-02 ENCOUNTER — Encounter (HOSPITAL_COMMUNITY): Payer: Self-pay | Admitting: *Deleted

## 2011-09-02 DIAGNOSIS — Q231 Congenital insufficiency of aortic valve: Secondary | ICD-10-CM | POA: Diagnosis not present

## 2011-09-02 DIAGNOSIS — D62 Acute posthemorrhagic anemia: Secondary | ICD-10-CM | POA: Diagnosis not present

## 2011-09-02 DIAGNOSIS — E785 Hyperlipidemia, unspecified: Secondary | ICD-10-CM | POA: Diagnosis present

## 2011-09-02 DIAGNOSIS — I350 Nonrheumatic aortic (valve) stenosis: Secondary | ICD-10-CM

## 2011-09-02 DIAGNOSIS — I10 Essential (primary) hypertension: Secondary | ICD-10-CM | POA: Diagnosis present

## 2011-09-02 DIAGNOSIS — Z951 Presence of aortocoronary bypass graft: Secondary | ICD-10-CM | POA: Diagnosis not present

## 2011-09-02 DIAGNOSIS — I509 Heart failure, unspecified: Secondary | ICD-10-CM | POA: Diagnosis present

## 2011-09-02 DIAGNOSIS — I351 Nonrheumatic aortic (valve) insufficiency: Secondary | ICD-10-CM

## 2011-09-02 DIAGNOSIS — I2510 Atherosclerotic heart disease of native coronary artery without angina pectoris: Secondary | ICD-10-CM

## 2011-09-02 DIAGNOSIS — I300 Acute nonspecific idiopathic pericarditis: Secondary | ICD-10-CM | POA: Diagnosis not present

## 2011-09-02 DIAGNOSIS — E7800 Pure hypercholesterolemia, unspecified: Secondary | ICD-10-CM | POA: Diagnosis not present

## 2011-09-02 DIAGNOSIS — R51 Headache: Secondary | ICD-10-CM | POA: Diagnosis not present

## 2011-09-02 DIAGNOSIS — I5033 Acute on chronic diastolic (congestive) heart failure: Secondary | ICD-10-CM | POA: Diagnosis present

## 2011-09-02 DIAGNOSIS — Z952 Presence of prosthetic heart valve: Secondary | ICD-10-CM

## 2011-09-02 DIAGNOSIS — I359 Nonrheumatic aortic valve disorder, unspecified: Principal | ICD-10-CM | POA: Diagnosis present

## 2011-09-02 DIAGNOSIS — Z09 Encounter for follow-up examination after completed treatment for conditions other than malignant neoplasm: Secondary | ICD-10-CM | POA: Diagnosis not present

## 2011-09-02 DIAGNOSIS — I5031 Acute diastolic (congestive) heart failure: Secondary | ICD-10-CM | POA: Diagnosis not present

## 2011-09-02 DIAGNOSIS — J9819 Other pulmonary collapse: Secondary | ICD-10-CM | POA: Diagnosis not present

## 2011-09-02 LAB — POCT I-STAT, CHEM 8
Calcium, Ion: 1.12 mmol/L (ref 1.12–1.32)
Creatinine, Ser: 1.3 mg/dL (ref 0.50–1.35)
Glucose, Bld: 134 mg/dL — ABNORMAL HIGH (ref 70–99)
Hemoglobin: 11.2 g/dL — ABNORMAL LOW (ref 13.0–17.0)
Sodium: 139 mEq/L (ref 135–145)
TCO2: 21 mmol/L (ref 0–100)

## 2011-09-02 LAB — POCT I-STAT 3, ART BLOOD GAS (G3+)
Acid-Base Excess: 3 mmol/L — ABNORMAL HIGH (ref 0.0–2.0)
Bicarbonate: 27.5 meq/L — ABNORMAL HIGH (ref 20.0–24.0)
Bicarbonate: 24 mEq/L (ref 20.0–24.0)
O2 Saturation: 100 %
O2 Saturation: 100 %
O2 Saturation: 99 %
TCO2: 29 mmol/L (ref 0–100)
TCO2: 25 mmol/L (ref 0–100)
pCO2 arterial: 46.6 mmHg — ABNORMAL HIGH (ref 35.0–45.0)
pCO2 arterial: 42.2 mmHg (ref 35.0–45.0)
pCO2 arterial: 38.4 mmHg (ref 35.0–45.0)
pCO2 arterial: 39.5 mmHg (ref 35.0–45.0)
pH, Arterial: 7.422 (ref 7.350–7.450)
pH, Arterial: 7.422 (ref 7.350–7.450)
pH, Arterial: 7.423 (ref 7.350–7.450)
pO2, Arterial: 387 mmHg — ABNORMAL HIGH (ref 80.0–100.0)
pO2, Arterial: 120 mmHg — ABNORMAL HIGH (ref 80.0–100.0)

## 2011-09-02 LAB — POCT I-STAT 4, (NA,K, GLUC, HGB,HCT)
Glucose, Bld: 101 mg/dL — ABNORMAL HIGH (ref 70–99)
Glucose, Bld: 109 mg/dL — ABNORMAL HIGH (ref 70–99)
Glucose, Bld: 91 mg/dL (ref 70–99)
Glucose, Bld: 142 mg/dL — ABNORMAL HIGH (ref 70–99)
Glucose, Bld: 117 mg/dL — ABNORMAL HIGH (ref 70–99)
Glucose, Bld: 97 mg/dL (ref 70–99)
HCT: 36 % — ABNORMAL LOW (ref 39.0–52.0)
HCT: 28 % — ABNORMAL LOW (ref 39.0–52.0)
HCT: 33 % — ABNORMAL LOW (ref 39.0–52.0)
HCT: 26 % — ABNORMAL LOW (ref 39.0–52.0)
HCT: 26 % — ABNORMAL LOW (ref 39.0–52.0)
Hemoglobin: 12.2 g/dL — ABNORMAL LOW (ref 13.0–17.0)
Hemoglobin: 11.2 g/dL — ABNORMAL LOW (ref 13.0–17.0)
Hemoglobin: 9.5 g/dL — ABNORMAL LOW (ref 13.0–17.0)
Hemoglobin: 8.8 g/dL — ABNORMAL LOW (ref 13.0–17.0)
Hemoglobin: 8.8 g/dL — ABNORMAL LOW (ref 13.0–17.0)
Hemoglobin: 10.9 g/dL — ABNORMAL LOW (ref 13.0–17.0)
Potassium: 3.4 meq/L — ABNORMAL LOW (ref 3.5–5.1)
Potassium: 3.4 meq/L — ABNORMAL LOW (ref 3.5–5.1)
Potassium: 3.5 meq/L (ref 3.5–5.1)
Potassium: 4.4 mEq/L (ref 3.5–5.1)
Potassium: 3.5 meq/L (ref 3.5–5.1)
Potassium: 3.6 mEq/L (ref 3.5–5.1)
Sodium: 141 meq/L (ref 135–145)
Sodium: 139 meq/L (ref 135–145)
Sodium: 141 meq/L (ref 135–145)
Sodium: 136 mEq/L (ref 135–145)
Sodium: 135 mEq/L (ref 135–145)
Sodium: 139 meq/L (ref 135–145)

## 2011-09-02 LAB — CBC
HCT: 30 % — ABNORMAL LOW (ref 39.0–52.0)
Hemoglobin: 10.3 g/dL — ABNORMAL LOW (ref 13.0–17.0)
MCH: 28.5 pg (ref 26.0–34.0)
MCHC: 34.3 g/dL (ref 30.0–36.0)
MCV: 83.1 fL (ref 78.0–100.0)

## 2011-09-02 LAB — GLUCOSE, CAPILLARY: Glucose-Capillary: 64 mg/dL — ABNORMAL LOW (ref 70–99)

## 2011-09-02 LAB — HEMOGLOBIN AND HEMATOCRIT, BLOOD
HCT: 25.1 % — ABNORMAL LOW (ref 39.0–52.0)
Hemoglobin: 8.6 g/dL — ABNORMAL LOW (ref 13.0–17.0)

## 2011-09-02 LAB — PLATELET COUNT: Platelets: 156 K/uL (ref 150–400)

## 2011-09-02 SURGERY — AORTIC VALVE REPLACEMENT (AVR)/CORONARY ARTERY BYPASS GRAFTING (CABG)
Anesthesia: General | Site: Chest | Wound class: Clean

## 2011-09-02 MED ORDER — FAMOTIDINE IN NACL 20-0.9 MG/50ML-% IV SOLN
20.0000 mg | Freq: Two times a day (BID) | INTRAVENOUS | Status: AC
  Administered 2011-09-02 (×2): 20 mg via INTRAVENOUS
  Filled 2011-09-02: qty 50

## 2011-09-02 MED ORDER — PROTAMINE SULFATE 10 MG/ML IV SOLN
INTRAVENOUS | Status: DC | PRN
  Administered 2011-09-02: 300 mg via INTRAVENOUS

## 2011-09-02 MED ORDER — ALBUMIN HUMAN 5 % IV SOLN
INTRAVENOUS | Status: DC | PRN
  Administered 2011-09-02: 12:00:00 via INTRAVENOUS

## 2011-09-02 MED ORDER — ACETAMINOPHEN 160 MG/5ML PO SOLN: 975.0000 mg | Freq: Four times a day (QID) | ORAL | Status: DC

## 2011-09-02 MED ORDER — METOPROLOL TARTRATE 12.5 MG HALF TABLET
12.5000 mg | ORAL_TABLET | Freq: Two times a day (BID) | ORAL | Status: DC
  Administered 2011-09-03: 12.5 mg via ORAL
  Filled 2011-09-02 (×3): qty 1

## 2011-09-02 MED ORDER — SODIUM CHLORIDE 0.9 % IJ SOLN: 3.0000 mL | INTRAMUSCULAR | Status: DC | PRN

## 2011-09-02 MED ORDER — ASPIRIN 81 MG PO CHEW: 324.0000 mg | CHEWABLE_TABLET | Freq: Every day | ORAL | Status: DC

## 2011-09-02 MED ORDER — POTASSIUM CHLORIDE 10 MEQ/50ML IV SOLN
10.0000 meq | INTRAVENOUS | Status: AC
  Administered 2011-09-02 (×3): 10 meq via INTRAVENOUS

## 2011-09-02 MED ORDER — HEMOSTATIC AGENTS (NO CHARGE) OPTIME
TOPICAL | Status: DC | PRN
  Administered 2011-09-02: 1 via TOPICAL

## 2011-09-02 MED ORDER — PANTOPRAZOLE SODIUM 40 MG PO TBEC: 40.0000 mg | DELAYED_RELEASE_TABLET | Freq: Every day | ORAL | Status: DC

## 2011-09-02 MED ORDER — MIDAZOLAM HCL 2 MG/2ML IJ SOLN: 2.0000 mg | INTRAMUSCULAR | Status: DC | PRN

## 2011-09-02 MED ORDER — VITAMIN D 50 MCG (2000 UT) PO CAPS: 1.0000 | ORAL_CAPSULE | Freq: Every day | ORAL | Status: DC

## 2011-09-02 MED ORDER — FENOFIBRATE 160 MG PO TABS
160.0000 mg | ORAL_TABLET | Freq: Every day | ORAL | Status: DC
  Administered 2011-09-03 – 2011-09-08 (×6): 160 mg via ORAL
  Filled 2011-09-02 (×7): qty 1

## 2011-09-02 MED ORDER — ACETAMINOPHEN 160 MG/5ML PO SOLN
650.0000 mg | ORAL | Status: AC
  Filled 2011-09-02: qty 20.3

## 2011-09-02 MED ORDER — VECURONIUM BROMIDE 10 MG IV SOLR
INTRAVENOUS | Status: DC | PRN
  Administered 2011-09-02 (×4): 5 mg via INTRAVENOUS

## 2011-09-02 MED ORDER — BISACODYL 5 MG PO TBEC
10.0000 mg | DELAYED_RELEASE_TABLET | Freq: Every day | ORAL | Status: DC
  Administered 2011-09-03: 10 mg via ORAL
  Filled 2011-09-02: qty 2

## 2011-09-02 MED ORDER — SODIUM CHLORIDE 0.9 % IV SOLN
0.1000 ug/kg/h | INTRAVENOUS | Status: DC
  Filled 2011-09-02: qty 2

## 2011-09-02 MED ORDER — INSULIN ASPART 100 UNIT/ML ~~LOC~~ SOLN
0.0000 [IU] | SUBCUTANEOUS | Status: DC
  Administered 2011-09-02 – 2011-09-03 (×2): 2 [IU] via SUBCUTANEOUS

## 2011-09-02 MED ORDER — SODIUM CHLORIDE 0.9 % IV SOLN: 250.0000 mL | INTRAVENOUS | Status: DC

## 2011-09-02 MED ORDER — FENTANYL CITRATE 0.05 MG/ML IJ SOLN
INTRAMUSCULAR | Status: DC | PRN
  Administered 2011-09-02: 150 ug via INTRAVENOUS
  Administered 2011-09-02: 750 ug via INTRAVENOUS
  Administered 2011-09-02: 100 ug via INTRAVENOUS
  Administered 2011-09-02 (×2): 250 ug via INTRAVENOUS

## 2011-09-02 MED ORDER — SODIUM CHLORIDE 0.9 % IV SOLN: INTRAVENOUS | Status: DC

## 2011-09-02 MED ORDER — SODIUM CHLORIDE 0.45 % IV SOLN
INTRAVENOUS | Status: DC
  Administered 2011-09-02: 20 mL via INTRAVENOUS

## 2011-09-02 MED ORDER — NITROGLYCERIN IN D5W 200-5 MCG/ML-% IV SOLN: 0.0000 ug/min | INTRAVENOUS | Status: DC

## 2011-09-02 MED ORDER — DEXTROSE 5 % IV SOLN
1.5000 g | Freq: Two times a day (BID) | INTRAVENOUS | Status: DC
  Administered 2011-09-02 – 2011-09-03 (×2): 1.5 g via INTRAVENOUS
  Filled 2011-09-02 (×3): qty 1.5

## 2011-09-02 MED ORDER — LACTATED RINGERS IV SOLN
INTRAVENOUS | Status: DC | PRN
  Administered 2011-09-02: 08:00:00 via INTRAVENOUS

## 2011-09-02 MED ORDER — INSULIN REGULAR HUMAN 100 UNIT/ML IJ SOLN
INTRAMUSCULAR | Status: DC
  Administered 2011-09-02: 14:00:00 via INTRAVENOUS
  Filled 2011-09-02: qty 1

## 2011-09-02 MED ORDER — MORPHINE SULFATE 4 MG/ML IJ SOLN
2.0000 mg | INTRAMUSCULAR | Status: DC | PRN
  Administered 2011-09-02 – 2011-09-03 (×2): 4 mg via INTRAVENOUS
  Administered 2011-09-03: 2 mg via INTRAVENOUS
  Filled 2011-09-02 (×4): qty 1

## 2011-09-02 MED ORDER — PROPOFOL 10 MG/ML IV EMUL
INTRAVENOUS | Status: DC | PRN
  Administered 2011-09-02: 20 mg via INTRAVENOUS

## 2011-09-02 MED ORDER — ALBUMIN HUMAN 5 % IV SOLN
250.0000 mL | INTRAVENOUS | Status: DC | PRN
  Administered 2011-09-02: 250 mL via INTRAVENOUS

## 2011-09-02 MED ORDER — BISACODYL 10 MG RE SUPP: 10.0000 mg | Freq: Every day | RECTAL | Status: DC

## 2011-09-02 MED ORDER — 0.9 % SODIUM CHLORIDE (POUR BTL) OPTIME
TOPICAL | Status: DC | PRN
  Administered 2011-09-02: 7000 mL

## 2011-09-02 MED ORDER — DOCUSATE SODIUM 100 MG PO CAPS
200.0000 mg | ORAL_CAPSULE | Freq: Every day | ORAL | Status: DC
  Administered 2011-09-03: 200 mg via ORAL
  Filled 2011-09-02: qty 2

## 2011-09-02 MED ORDER — VANCOMYCIN HCL IN DEXTROSE 1-5 GM/200ML-% IV SOLN
1000.0000 mg | Freq: Once | INTRAVENOUS | Status: AC
  Administered 2011-09-02: 1000 mg via INTRAVENOUS
  Filled 2011-09-02: qty 200

## 2011-09-02 MED ORDER — ONDANSETRON HCL 4 MG/2ML IJ SOLN
4.0000 mg | Freq: Four times a day (QID) | INTRAMUSCULAR | Status: DC | PRN
  Administered 2011-09-03 (×3): 4 mg via INTRAVENOUS
  Filled 2011-09-02 (×3): qty 2

## 2011-09-02 MED ORDER — HEPARIN SODIUM (PORCINE) 1000 UNIT/ML IJ SOLN
INTRAMUSCULAR | Status: DC | PRN
  Administered 2011-09-02: 33000 [IU] via INTRAVENOUS

## 2011-09-02 MED ORDER — INSULIN ASPART 100 UNIT/ML ~~LOC~~ SOLN
0.0000 [IU] | SUBCUTANEOUS | Status: DC
  Administered 2011-09-03 (×2): 2 [IU] via SUBCUTANEOUS

## 2011-09-02 MED ORDER — LACTATED RINGERS IV SOLN: 500.0000 mL | Freq: Once | INTRAVENOUS | Status: AC | PRN

## 2011-09-02 MED ORDER — MAGNESIUM SULFATE 40 MG/ML IJ SOLN
4.0000 g | Freq: Once | INTRAMUSCULAR | Status: AC
  Administered 2011-09-02: 4 g via INTRAVENOUS
  Filled 2011-09-02: qty 100

## 2011-09-02 MED ORDER — PHENYLEPHRINE HCL 10 MG/ML IJ SOLN
0.0000 ug/min | INTRAMUSCULAR | Status: DC
  Administered 2011-09-02: 14:00:00 via INTRAVENOUS
  Filled 2011-09-02: qty 2

## 2011-09-02 MED ORDER — ASPIRIN EC 325 MG PO TBEC
325.0000 mg | DELAYED_RELEASE_TABLET | Freq: Every day | ORAL | Status: DC
  Administered 2011-09-03: 325 mg via ORAL
  Filled 2011-09-02: qty 1

## 2011-09-02 MED ORDER — METOPROLOL TARTRATE 1 MG/ML IV SOLN: 2.5000 mg | INTRAVENOUS | Status: DC | PRN

## 2011-09-02 MED ORDER — VITAMIN D3 25 MCG (1000 UNIT) PO TABS
2000.0000 [IU] | ORAL_TABLET | Freq: Every day | ORAL | Status: DC
  Administered 2011-09-03 – 2011-09-08 (×6): 2000 [IU] via ORAL
  Filled 2011-09-02 (×7): qty 2

## 2011-09-02 MED ORDER — PHENYLEPHRINE HCL 10 MG/ML IJ SOLN: 10.0000 mg | INTRAVENOUS | Status: DC | PRN

## 2011-09-02 MED ORDER — LACTATED RINGERS IV SOLN
INTRAVENOUS | Status: DC
  Administered 2011-09-02: 14:00:00 via INTRAVENOUS

## 2011-09-02 MED ORDER — MORPHINE SULFATE 2 MG/ML IJ SOLN
1.0000 mg | INTRAMUSCULAR | Status: DC | PRN
  Administered 2011-09-02 – 2011-09-03 (×2): 2 mg via INTRAVENOUS
  Filled 2011-09-02 (×2): qty 1

## 2011-09-02 MED ORDER — OXYCODONE HCL 5 MG PO TABS
5.0000 mg | ORAL_TABLET | ORAL | Status: DC | PRN
  Administered 2011-09-03: 5 mg via ORAL
  Filled 2011-09-02: qty 2

## 2011-09-02 MED ORDER — THROMBIN 20000 UNITS EX KIT
PACK | OROMUCOSAL | Status: DC | PRN
  Administered 2011-09-02: 10:00:00 via TOPICAL

## 2011-09-02 MED ORDER — LACTATED RINGERS IV SOLN: INTRAVENOUS | Status: DC | PRN

## 2011-09-02 MED ORDER — ACETAMINOPHEN 500 MG PO TABS
1000.0000 mg | ORAL_TABLET | Freq: Four times a day (QID) | ORAL | Status: DC
  Administered 2011-09-03 (×2): 1000 mg via ORAL
  Filled 2011-09-02 (×5): qty 2

## 2011-09-02 MED ORDER — DOPAMINE-DEXTROSE 3.2-5 MG/ML-% IV SOLN
INTRAVENOUS | Status: DC | PRN
  Administered 2011-09-02: 2 ug/kg/min via INTRAVENOUS

## 2011-09-02 MED ORDER — PHENYLEPHRINE HCL 10 MG/ML IJ SOLN
20.0000 mg | INTRAVENOUS | Status: DC | PRN
  Administered 2011-09-02: 5 ug/min via INTRAVENOUS

## 2011-09-02 MED ORDER — TAMSULOSIN HCL 0.4 MG PO CAPS
0.4000 mg | ORAL_CAPSULE | Freq: Every day | ORAL | Status: DC
  Administered 2011-09-03 – 2011-09-08 (×6): 0.4 mg via ORAL
  Filled 2011-09-02 (×7): qty 1

## 2011-09-02 MED ORDER — ROCURONIUM BROMIDE 100 MG/10ML IV SOLN
INTRAVENOUS | Status: DC | PRN
  Administered 2011-09-02: 50 mg via INTRAVENOUS

## 2011-09-02 MED ORDER — INSULIN REGULAR BOLUS VIA INFUSION
0.0000 [IU] | Freq: Three times a day (TID) | INTRAVENOUS | Status: DC
  Filled 2011-09-02: qty 10

## 2011-09-02 MED ORDER — METOPROLOL TARTRATE 25 MG/10 ML ORAL SUSPENSION
12.5000 mg | Freq: Two times a day (BID) | ORAL | Status: DC
  Filled 2011-09-02 (×3): qty 5

## 2011-09-02 MED ORDER — MIDAZOLAM HCL 5 MG/5ML IJ SOLN
INTRAMUSCULAR | Status: DC | PRN
  Administered 2011-09-02: 3 mg via INTRAVENOUS
  Administered 2011-09-02: 5 mg via INTRAVENOUS
  Administered 2011-09-02: 3 mg via INTRAVENOUS
  Administered 2011-09-02: 5 mg via INTRAVENOUS
  Administered 2011-09-02 (×2): 2 mg via INTRAVENOUS

## 2011-09-02 MED ORDER — DEXTROSE 50 % IV SOLN
INTRAVENOUS | Status: AC
  Administered 2011-09-02: 14 mL
  Filled 2011-09-02: qty 50

## 2011-09-02 MED ORDER — SODIUM CHLORIDE 0.9 % IJ SOLN
3.0000 mL | Freq: Two times a day (BID) | INTRAMUSCULAR | Status: DC
  Administered 2011-09-03: 11:00:00 via INTRAVENOUS

## 2011-09-02 MED ORDER — ACETAMINOPHEN 650 MG RE SUPP
650.0000 mg | RECTAL | Status: AC
  Administered 2011-09-02: 650 mg via RECTAL
  Filled 2011-09-02: qty 1

## 2011-09-02 SURGICAL SUPPLY — 102 items
ADAPTER CARDIO PERF ANTE/RETRO (ADAPTER) ×2 IMPLANT
APPLICATOR COTTON TIP 6IN STRL (MISCELLANEOUS) IMPLANT
ATTRACTOMAT 16X20 MAGNETIC DRP (DRAPES) ×2 IMPLANT
BAG DECANTER FOR FLEXI CONT (MISCELLANEOUS) ×2 IMPLANT
BANDAGE ELASTIC 4 VELCRO ST LF (GAUZE/BANDAGES/DRESSINGS) ×2 IMPLANT
BANDAGE ELASTIC 6 VELCRO ST LF (GAUZE/BANDAGES/DRESSINGS) ×2 IMPLANT
BANDAGE GAUZE ELAST BULKY 4 IN (GAUZE/BANDAGES/DRESSINGS) ×2 IMPLANT
BASKET HEART (ORDER IN 25'S) (MISCELLANEOUS) ×1
BASKET HEART (ORDER IN 25S) (MISCELLANEOUS) ×1 IMPLANT
BENZOIN TINCTURE PRP APPL 2/3 (GAUZE/BANDAGES/DRESSINGS) ×8 IMPLANT
BLADE SURG 11 STRL SS (BLADE) ×2 IMPLANT
BLADE SURG 15 STRL LF DISP TIS (BLADE) ×1 IMPLANT
BLADE SURG 15 STRL SS (BLADE) ×1
BLADE SURG ROTATE 9660 (MISCELLANEOUS) ×2 IMPLANT
CANNULA GUNDRY RCSP 15FR (MISCELLANEOUS) ×2 IMPLANT
CATH ROBINSON RED A/P 18FR (CATHETERS) ×6 IMPLANT
CATH THORACIC 28FR (CATHETERS) ×2 IMPLANT
CATH THORACIC 36FR (CATHETERS) ×2 IMPLANT
CATH THORACIC 36FR RT ANG (CATHETERS) ×2 IMPLANT
CLIP FOGARTY SPRING 6M (CLIP) IMPLANT
CLIP TI MEDIUM 24 (CLIP) IMPLANT
CLIP TI WIDE RED SMALL 24 (CLIP) IMPLANT
CLOTH BEACON ORANGE TIMEOUT ST (SAFETY) ×2 IMPLANT
CLSR STERI-STRIP ANTIMIC 1/2X4 (GAUZE/BANDAGES/DRESSINGS) ×2 IMPLANT
CONT SPEC 4OZ CLIKSEAL STRL BL (MISCELLANEOUS) ×2 IMPLANT
COVER SURGICAL LIGHT HANDLE (MISCELLANEOUS) ×4 IMPLANT
CRADLE DONUT ADULT HEAD (MISCELLANEOUS) ×2 IMPLANT
DRAPE SLUSH MACHINE 52X66 (DRAPES) IMPLANT
DRAPE SLUSH/WARMER DISC (DRAPES) ×2 IMPLANT
DRSG COVADERM 4X14 (GAUZE/BANDAGES/DRESSINGS) ×2 IMPLANT
ELECT CAUTERY BLADE 6.4 (BLADE) ×2 IMPLANT
ELECT REM PT RETURN 9FT ADLT (ELECTROSURGICAL) ×4
ELECTRODE REM PT RTRN 9FT ADLT (ELECTROSURGICAL) ×2 IMPLANT
GLOVE BIO SURGEON STRL SZ 6 (GLOVE) ×12 IMPLANT
GLOVE BIO SURGEON STRL SZ 6.5 (GLOVE) ×10 IMPLANT
GLOVE BIO SURGEON STRL SZ7 (GLOVE) IMPLANT
GLOVE BIO SURGEON STRL SZ7.5 (GLOVE) IMPLANT
GLOVE BIO SURGEON STRL SZ8 (GLOVE) ×4 IMPLANT
GLOVE BIOGEL PI IND STRL 7.0 (GLOVE) ×1 IMPLANT
GLOVE BIOGEL PI INDICATOR 7.0 (GLOVE) ×1
GLOVE EUDERMIC 7 POWDERFREE (GLOVE) ×4 IMPLANT
GOWN PREVENTION PLUS XLARGE (GOWN DISPOSABLE) ×2 IMPLANT
GOWN STRL NON-REIN LRG LVL3 (GOWN DISPOSABLE) ×14 IMPLANT
HEART VENT LT CURVED (MISCELLANEOUS) ×2 IMPLANT
HEMOSTAT POWDER SURGIFOAM 1G (HEMOSTASIS) ×2 IMPLANT
HEMOSTAT SURGICEL 2X14 (HEMOSTASIS) ×6 IMPLANT
INSERT FOGARTY XLG (MISCELLANEOUS) ×2 IMPLANT
KIT BASIN OR (CUSTOM PROCEDURE TRAY) ×2 IMPLANT
KIT CATH CPB BARTLE (MISCELLANEOUS) ×2 IMPLANT
KIT ROOM TURNOVER OR (KITS) ×2 IMPLANT
KIT SUCTION CATH 14FR (SUCTIONS) ×2 IMPLANT
KIT VASOVIEW W/TROCAR VH 2000 (KITS) ×2 IMPLANT
LINE VENT (MISCELLANEOUS) ×2 IMPLANT
NS IRRIG 1000ML POUR BTL (IV SOLUTION) ×14 IMPLANT
PACK OPEN HEART (CUSTOM PROCEDURE TRAY) ×2 IMPLANT
PAD ARMBOARD 7.5X6 YLW CONV (MISCELLANEOUS) ×4 IMPLANT
PENCIL BUTTON HOLSTER BLD 10FT (ELECTRODE) ×2 IMPLANT
PUNCH AORTIC ROTATE 4.0MM (MISCELLANEOUS) IMPLANT
PUNCH AORTIC ROTATE 4.5MM 8IN (MISCELLANEOUS) ×4 IMPLANT
PUNCH AORTIC ROTATE 5MM 8IN (MISCELLANEOUS) IMPLANT
SEALANT SURG COSEAL 4ML (VASCULAR PRODUCTS) IMPLANT
SEALANT SURG COSEAL 8ML (VASCULAR PRODUCTS) IMPLANT
SET CARDIOPLEGIA MPS 5001102 (MISCELLANEOUS) ×2 IMPLANT
SPONGE GAUZE 4X4 12PLY (GAUZE/BANDAGES/DRESSINGS) ×6 IMPLANT
SPONGE LAP 4X18 X RAY DECT (DISPOSABLE) IMPLANT
STRIP CLOSURE SKIN 1/2X4 (GAUZE/BANDAGES/DRESSINGS) ×2 IMPLANT
SUT BONE WAX W31G (SUTURE) ×2 IMPLANT
SUT ETHIBON 2 0 V 52N 30 (SUTURE) ×4 IMPLANT
SUT ETHIBON EXCEL 2-0 V-5 (SUTURE) IMPLANT
SUT ETHIBOND 2 0 SH (SUTURE) ×1
SUT ETHIBOND 2 0 SH 36X2 (SUTURE) ×1 IMPLANT
SUT ETHIBOND 2 0 V4 (SUTURE) IMPLANT
SUT ETHIBOND 2 0V4 GREEN (SUTURE) IMPLANT
SUT ETHIBOND V-5 VALVE (SUTURE) IMPLANT
SUT MNCRL AB 3-0 PS2 18 (SUTURE) IMPLANT
SUT MNCRL AB 4-0 PS2 18 (SUTURE) ×2 IMPLANT
SUT PROLENE 3 0 SH 1 (SUTURE) ×2 IMPLANT
SUT PROLENE 4 0 RB 1 (SUTURE) ×2
SUT PROLENE 4-0 RB1 .5 CRCL 36 (SUTURE) ×2 IMPLANT
SUT PROLENE 7 0 BV 1 (SUTURE) IMPLANT
SUT PROLENE 7 0 BV1 MDA (SUTURE) ×2 IMPLANT
SUT STEEL 6MS V (SUTURE) IMPLANT
SUT STEEL STERNAL CCS#1 18IN (SUTURE) IMPLANT
SUT STEEL SZ 6 DBL 3X14 BALL (SUTURE) ×6 IMPLANT
SUT VIC AB 1 CTX 36 (SUTURE) ×2
SUT VIC AB 1 CTX36XBRD ANBCTR (SUTURE) ×2 IMPLANT
SUT VIC AB 2-0 CT1 27 (SUTURE) ×1
SUT VIC AB 2-0 CT1 TAPERPNT 27 (SUTURE) ×1 IMPLANT
SUT VIC AB 3-0 SH 27 (SUTURE)
SUT VIC AB 3-0 SH 27X BRD (SUTURE) IMPLANT
SUTURE E-PAK OPEN HEART (SUTURE) ×2 IMPLANT
SYR 10ML KIT SKIN ADHESIVE (MISCELLANEOUS) IMPLANT
SYSTEM SAHARA CHEST DRAIN ATS (WOUND CARE) ×2 IMPLANT
TAPE CLOTH SURG 4X10 WHT LF (GAUZE/BANDAGES/DRESSINGS) ×4 IMPLANT
TOWEL OR 17X24 6PK STRL BLUE (TOWEL DISPOSABLE) ×4 IMPLANT
TOWEL OR 17X26 10 PK STRL BLUE (TOWEL DISPOSABLE) ×4 IMPLANT
TRAY FOLEY IC TEMP SENS 14FR (CATHETERS) ×2 IMPLANT
TUBE SUCT INTRACARD DLP 20F (MISCELLANEOUS) ×2 IMPLANT
TUBING INSUFFLATION 10FT LAP (TUBING) ×2 IMPLANT
UNDERPAD 30X30 INCONTINENT (UNDERPADS AND DIAPERS) ×2 IMPLANT
VALVE MAGNA EASE AORTIC 27MM (Prosthesis & Implant Heart) ×2 IMPLANT
WATER STERILE IRR 1000ML POUR (IV SOLUTION) ×4 IMPLANT

## 2011-09-02 NOTE — H&P (Signed)
[address]            [name] [address]          [phone]      PCP is Corine Shelter, MD  Referring Provider is Lesleigh Noe, MD  Chief Complaint   Patient presents with   .  Aortic Stenosis     Referral from Dr Verdis Prime for eval on Aortic stenosis   HPI:  The patient is a 71 year old gentleman with known aortic stenosis that has been followed by Dr. Katrinka Blazing with annual echocardiogram. During his most recent followup visit on 07/21/2011 he reported having some intermittent left anterior chest discomfort over the past year. He said this occurs with rest and with mild exertion and resolves without treatment. He had a followup echocardiogram on 07/14/2011 that showed progression of his aortic stenosis to severe with an aortic valve area of 0.65-0.73 with a mean gradient of 38 mm mercury and a peak gradient of 63 mm mercury. Left ventricular ejection fraction was 60-65%. There is moderate concentric left ventricular hypertrophy. There is mild to moderate mitral valve regurgitation. Aortic valve appeared to be bicuspid. There is mild aortic root dilatation with aortic root diameter measured at 3.7 cm. There is mild pulmonic and tricuspid regurgitation. He subsequently underwent cardiac catheterization in preparation for surgery which showed severe aortic stenosis with a 75% left circumflex stenosis and insignificant 30-50% right coronary stenosis.  Past Medical History   Diagnosis  Date   .  Hypertension    .  Hyperlipidemia    .  Aortic stenosis    .  Bicuspid aortic valve    .  Aortic valve regurgitation     Past Surgical History   Procedure  Date   .  Cyst removal, right side if patient's neck    .  Colonoscopy      2007   .  Eye lid lift both eyes     Family History   Problem  Relation  Age of Onset   .  Hypertension  Father       CABG at 61    .  Cancer  Mother    .  Hypertension  Sister    Social History  History     Substance Use Topics   .  Smoking status:  Never Smoker   .  Smokeless tobacco:  Never Used   .  Alcohol Use:  Yes      wine    Current Outpatient Prescriptions   Medication  Sig  Dispense  Refill   .  Choline Fenofibrate (TRILIPIX) 135 MG capsule  Take 135 mg by mouth daily.     [name]  losartan-hydrochlorothiazide (HYZAAR) 100-25 MG per tablet  Take 1 tablet by mouth daily.     .  Tamsulosin HCl (FLOMAX) 0.4 MG CAPS  Take 0.4 mg by mouth daily.     [name]  aspirin 81 MG tablet  Take 81 mg by mouth daily.     .  Cholecalciferol (VITAMIN D-3) 5000 UNITS TABS  Take by mouth 1 day or 1 dose.     .  fish oil-omega-3 fatty acids 1000 MG capsule  Take 1 g by mouth daily.     [name]  loratadine (CLARITIN) 10 MG tablet  Take 10 mg by mouth daily.     .  Multiple Vitamin (MULTIVITAMIN)  tablet  Take 1 tablet by mouth daily.     .  naproxen sodium (ANAPROX) 220 MG tablet  Take 220 mg by mouth as needed.      No current facility-administered medications for this visit.    Facility-Administered Medications Ordered in Other Visits   Medication  Dose  Route  Frequency  Provider  Last Rate  Last Dose   .  DISCONTD: 0.9 % sodium chloride infusion   Intravenous  Continuous  Lesleigh Noe, MD  75 mL/hr at 08/19/11 0800    .  DISCONTD: 0.9 % sodium chloride infusion   Intravenous  Continuous  Lyn Records III, MD     .  DISCONTD: acetaminophen (TYLENOL) tablet 650 mg  650 mg  Oral  Q4H PRN  Lyn Records III, MD     .  DISCONTD: ondansetron [facility]) injection 4 mg  4 mg  Intravenous  Q6H PRN  Lesleigh Noe, MD     No Known Allergies  Review of Systems  Constitutional: Positive for activity change and fatigue. Negative for chills, appetite change and unexpected weight change.  Less active over the past year due to back injury.  Tired and falls asleep easily after meals.  HENT: Negative.  Sees his dentist every 6 months.  Eyes: Negative.  Respiratory: Negative for cough and shortness of breath.   Cardiovascular: Positive for chest pain. Negative for palpitations and leg swelling.  Gastrointestinal: Negative.  Genitourinary: Negative.  Musculoskeletal: Positive for back pain.  Neurological: Negative for dizziness, syncope, weakness, light-headedness and headaches.  Hematological: Negative.  Psychiatric/Behavioral: Negative.  BP 151/76  Pulse 78  Resp 16  Ht 5\' 9"  (1.753 m)  Wt 202 lb (91.627 kg)  BMI 29.83 kg/m2  SpO2 97%  Physical Exam  Constitutional: He is oriented to person, place, and time. He appears well-developed and well-nourished.  HENT:  Head: Normocephalic and atraumatic.  Mouth/Throat: Oropharynx is clear and moist. Abnormal dentition.  Wears partial on bottom, denture on top.  Eyes: Conjunctivae and EOM are normal. Pupils are equal, round, and reactive to light.  Neck: Normal range of motion. Neck supple. No JVD present. No thyromegaly present.  Cardiovascular: Normal rate and regular rhythm. Exam reveals no gallop and no friction rub.  Murmur heard.  Systolic murmur is present with a grade of 3/6  Pulmonary/Chest: Breath sounds normal.  Abdominal: Bowel sounds are normal. He exhibits no mass. There is no tenderness.  Musculoskeletal: He exhibits no edema.  Lymphadenopathy:  He has no cervical adenopathy.  Neurological: He is alert and oriented to person, place, and time. He has normal strength. No cranial nerve deficit or sensory deficit.  Skin: Skin is warm and dry.  Psychiatric: He has a normal mood and affect.  Diagnostic Tests:  Cardiac Cath:  HEMODYNAMICS: Aortic pressure was 104/60 mmHg; LV pressure was 153/13 mmHg; LVEDP 18 mm mercury; right atrial pressure 8 mm mercury mean; right ventricular pressure 30/9 mmHg; main pulmonary artery pressure 28/10 mmHg; pulmonary capillary wedge pressure mean, 11mm mercury; cardiac output 4.3 L per minute (Fick), and 3.7 L per minute (Thermo); aortic valve gradient of 49 mm mercury (peak-peak)and 39mm mercury  (mean); aortic valve area 0.71 cm square (Fick) and 0.61 cm square (Thermo).  ANGIOGRAPHIC DATA: The left main coronary artery is widely patent.  The left anterior descending artery is widely patent with transapical course. 2 diagonal branches are widely patent..  The left circumflex artery is mid vessel 50-75% tandem lesions  before a large bifurcating second obtuse marginal.  The right coronary artery is mid vessel 30-50% stenosis. The vessel is dominant. No significant obstruction is seen.[name]  LEFT VENTRICULOGRAM: Left ventricular angiogram was done in the 30 RAO projection and revealed normal left ventricular wall motion and systolic function with an estimated ejection fraction of 65%.  IMPRESSIONS: 1. Severe/critical aortic stenosis producing symptoms. Aortic valve area less than 0.75 cm square.  The valve is heavily calcified and has restricted motion on cinefluoroscopy .  2. Single-vessel coronary artery disease with 75% mid circumflex stenosis.  3. Normal left ventricular systolic function, EF 65%.  Impression:  The patient has severe aortic stenosis with a bicuspid aortic valve and significant left circumflex coronary stenosis. He is becoming more symptomatic and I agree it is time to proceed with surgery. His aortic root is mildly enlarged but not a level that would require replacement. I discussed the operative procedure of aortic valve replacement and coronary bypass to the left circumflex coronary artery with the patient and his wife and daughter. I discussed the pros and cons of mechanical and tissue valves and my recommendation for a tissue valve given his age. He is in agreement to use a tissue valve. I discussed the operative procedure with the patient and family including alternatives, benefits and risks; including but not limited to bleeding, blood transfusion, infection, stroke, myocardial infarction, graft failure, heart block requiring a permanent pacemaker, organ dysfunction, and  death. Sallee Lange understands and agrees to proceed.  Plan:  We have scheduled aortic valve replacement and coronary bypass surgery to the left circumflex coronary artery on Wednesday, 08/01/2011.

## 2011-09-02 NOTE — Preoperative (Signed)
Beta Blockers   Reason not to administer Beta Blockers:Not Applicable 

## 2011-09-02 NOTE — Progress Notes (Signed)
UR Completed.  Emir Nack Jane [phone] 09/02/2011  

## 2011-09-02 NOTE — Interval H&P Note (Signed)
History and Physical Interval Note:  09/02/2011 7:30 AM  [name]  has presented today for surgery, with the diagnosis of AS,CAD  The various methods of treatment have been discussed with the patient and family. After consideration of risks, benefits and other options for treatment, the patient has consented to  Procedure(s) (LRB): AORTIC VALVE REPLACEMENT (AVR)/CORONARY ARTERY BYPASS GRAFTING (CABG) (N/A) as a surgical intervention .  The patients' history has been reviewed, patient examined, no change in status, stable for surgery.  I have reviewed the patients' chart and labs.  Questions were answered to the patient's satisfaction.     Alleen Borne

## 2011-09-02 NOTE — OR Nursing (Signed)
Chest incision at 0848 

## 2011-09-02 NOTE — Op Note (Signed)
NAME:  ERVIN, HENSLEY NO.:  [id number]  MEDICAL RECORD NO.:  [id number]  LOCATION:  2312                         FACILITY:  [facility]  PHYSICIAN:  Evelene Croon, M.D.     DATE OF BIRTH:  [birth year]  DATE OF PROCEDURE:  09/02/2011 DATE OF DISCHARGE:                              OPERATIVE REPORT   PREOPERATIVE DIAGNOSES:  Severe aortic stenosis and left circumflex coronary stenosis.  POSTOPERATIVE DIAGNOSES:  Severe aortic stenosis and left circumflex coronary stenosis.  OPERATIVE PROCEDURE:  Median sternotomy, extracorporeal circulation, coronary artery bypass graft surgery x1 using a saphenous vein graft to the obtuse marginal branch of the left circumflex coronary artery, aortic valve replacement using a 27-mm Edwards pericardial Magna-Ease valve.  Endoscopic vein harvesting from the right leg.  ATTENDING SURGEON:  Evelene Croon, M.D.  ASSISTANT:  Coral Ceo, P.A.  ANESTHESIA:  General endotracheal.  CLINICAL HISTORY:  This patient is a 71 year old gentleman with known aortic stenosis, has been followed by Dr. Verdis Prime with annual echocardiogram.  During his most recent visit in March, he was reported having intermittent left chest discomfort over the past year occurring with rest and mild exertion.  A followup echocardiogram on July 14, 2011, showed progression of his aortic stenosis to severe with an aortic valve area of 0.65 to 0.73 cm squared with a mean gradient of 33 and a peak gradient of 63 mmHg.  Left ventricular ejection fraction was 60- 65%.  There was moderate concentric left ventricular hypertrophy.  There was mild-to-moderate mitral regurgitation.  The aortic valve appeared to be bicuspid.  There was mild aortic root dilatation with an aortic root diameter measured at 3.7 cm.  There was mild pulmonic and tricuspid regurgitation.  He subsequently underwent cardiac catheterizations in preparation for surgery, which showed severe aortic  stenosis with a 75% left circumflex stenosis and insignificant 30-50% right coronary stenosis.  After review of the studies and examination, the patient was felt that the best treatment was coronary bypass to the left circumflex using a saphenous vein graft as well as aortic valve replacement with tissue valve.  I discussed the operative procedure with the patient and his family including alternatives, benefits, and risks including, but not limited to bleeding, blood transfusion, infection, stroke, myocardial infarction, graft failure, and death.  He understood and agreed to proceed.  OPERATIVE PROCEDURE:  The patient was taken to the operating room and placed on table in supine position.  After induction of general endotracheal anesthesia, a Foley catheter was placed in bladder using sterile technique.  Then, the chest, abdomen, and both lower extremities were prepped and draped in usual sterile manner.  A transesophageal echocardiogram was performed by anesthesiology.  This showed severe calcific aortic stenosis with mild aortic insufficiency.  There was moderate concentric left ventricular hypertrophy with good left ventricular contractility.  The mitral valve appeared to have some prolapse of the anterior leaflet.  There was an eccentric jet of mitral regurgitation directed posteriorly which I graded as mild-to-moderate. This was over a fairly short area.  There was no reversible pulmonary veins.  Given the patient's history and symptoms as well as the fact that he had normal left  ventricular function, and low pulmonary artery pressures with severe aortic stenosis, I felt that it will be best to leave his mitral regurgitation alone since I did not think that this was causing a problem at this time.  Intervention on his mitral valve would significantly increase the complexity and risk of the surgery.  Then, the chest was opened through a median sternotomy incision and  the pericardium opened midline.  Examination of the heart showed good ventricular contractility.  The ascending aorta had no palpable plaques in it.  The ascending aorta was mildly enlarged.  Then, the segment of greater saphenous vein was harvested from the right thigh using endoscopic vein harvest technique.  This vein was of medium size and good quality.  Then, the patient was heparinized when adequate ACT was obtained.  The distal ascending aorta was cannulated using a 20-French aortic cannula for arterial inflow.  Venous outflow was achieved using a two-stage venous cannula for the right atrial appendage.  An antegrade cardioplegia and vent cannula was inserted in aortic root.  A left ventricular vent was placed through the right superior pulmonary vein and the retrograde cardioplegic cannula was inserted through a pursestring suture in the right atrium and advanced in the coronary sinus.  Then, the patient was placed on cardiopulmonary bypass.  The distal coronaries were identified.  The obtuse marginal branch was a large graftable vessel with no distal disease in it.  Then, the aorta was crossclamped and 1000 mL of cold blood antegrade cardioplegia was administered in the aortic root with quick arrest of the heart.  This was followed by 500 mL of cold blood retrograde cardioplegia.  Systemic hypothermia to 32 degrees centigrade and topical hypothermic iced saline was used.  A temperature probe was placed in septum insulating pad in the pericardium.  Then, the coronary distal anastomosis was performed to the obtuse marginal branch in an end-to-side manner using continuous 7-0 Prolene suture.  The internal diameter of this vessel was about 2 mm.  The flow through the graft was excellent.  Then, attention was turned to aortic valve replacement.  The aorta was opened transversely about 1 cm above the sino-tubular junction. Examination of the native valve showed it was  essentially a bicuspid valve that was severely calcified.  The right and left coronary ostia were identified and were not obstructed.  The ostia had no disease around them.  Then, the native valve was excised.  The annulus was decalcified with rongeurs.  Care was taken to remove all particulate debris.  The left ventricle and aortic root were copiously irrigated with iced saline solution.  The annulus was sized and a 27-mm Edwards pericardial Magna- Ease valve was chosen.  This had model #[id number].  Serial number was [id number].  Then, a series of pledgeted 2-0 Ethibond horizontal mattress sutures were placed around the aortic annulus with the pledgets in a subannular position.  The sutures were placed through the sewing ring and the valve lowered in place.  The sutures were tied sequentially. The valve seated nicely.  The coronary ostia were not obstructed.  Then, the patient rewarmed to 37 degrees centigrade.  The aortotomy was closed in 2 layers using continuous 4-0 Prolene suture with felt strips to reinforce the closure.  Then, another dose of cold blood retrograde cardioplegia was given.  This was given throughout the procedure at about 20 minutes intervals to maintain myocardial temperature around 10 degrees centigrade or less.  Then, the single vein graft anastomosis was performed proximally  to the mid ascending aorta in an end-to-side manner using continuous 6-0 Prolene suture.  Then, the left side of the heart was de-aired.  We did use CO2 insufflation throughout the case and the pericardial cavity to minimize intracardiac air.  After de-airing maneuvers were performed, the head was placed in Trendelenburg position, and the crossclamp was removed with time of 104 minutes.  There was spontaneous return of ventricular fibrillation, and the patient was defibrillated into sinus rhythm.  The proximal and distal anastomoses appeared hemostatic and allowed the grafts satisfactory.  Two  temporary right ventricular and right atrial pacing wires placed and brought through the skin.  The graft markers placed around the proximal anastomosis.  When the patient rewarmed to 37 degrees centigrade, the left ventricular vent and retrograde cardioplegic cannulas were removed. The patient then weaned cardiopulmonary bypass on renal dosed dopamine at 2 mcg/kg per minute which was run throughout the bypass run due to renal insufficiency.  Cardiac function appeared excellent with cardiac output of 6 L minute.  TEE was performed to examine the heart and showed normal left ventricular function.  The degree of mitral regurgitation appears unchanged to me and remained eccentric.  Then, protamine was given, and the venous and aortic cannulas removed without difficulty. Hemostasis was achieved.  Two chest tubes were placed with the tube in the posterior pericardium and one in anterior mediastinum.  The sternum was then closed with double #6 stainless steel wires.  The fascia was closed with continuous #1 Vicryl suture.  Subcutaneous tissue was closed with continuous 2-0 Vicryl, and the skin with a 3-0 Vicryl subcuticular closure.  The lower extremity vein harvest site was closed in layers in similar manner.  The sponge, needle, instrument counts were correct according to scrub nurse.  Dry sterile dressing applied over the incisions and around the chest tubes, which were hooked to Pleur-Evac suction.  The patient remained hemodynamically stable and transferred to the SICU in guarded, but stable condition.     Evelene Croon, M.D.     BB/MEDQ  D:  09/02/2011  T:  09/02/2011  Job:  [id number]  cc:   Lyn Records, M.D.

## 2011-09-02 NOTE — Progress Notes (Signed)
*  PRELIMINARY RESULTS* Echocardiogram Echocardiogram Transesophageal has been performed.  Katheren Puller 09/02/2011, 8:42 AM

## 2011-09-02 NOTE — Brief Op Note (Signed)
09/02/2011  11:12 AM  PATIENT:  [name]  71 y.o. male  PRE-OPERATIVE DIAGNOSIS:  AS,CAD  POST-OPERATIVE DIAGNOSIS:  Aortic Stenosis and Coronary Artery Disease  PROCEDURE:  Procedure(s):  AORTIC VALVE REPLACEMENT (AVR) with 27 mm Edwards Magna Ease pericardial tissue valve   CORONARY ARTERY BYPASS GRAFTING (CABG) x 1 (SVG- Cx)  EVH Right thigh  SURGEON:  Surgeon(s): Alleen Borne, MD  ASSISTANT: Coral Ceo, PA-C  ANESTHESIA:   general  PATIENT CONDITION:  ICU - intubated and hemodynamically stable.  PRE-OPERATIVE WEIGHT: 91.3 kg

## 2011-09-02 NOTE — Transfer of Care (Signed)
Immediate Anesthesia Transfer of Care Note  Patient: [name]  Procedure(s) Performed: Procedure(s) (LRB): AORTIC VALVE REPLACEMENT (AVR)/CORONARY ARTERY BYPASS GRAFTING (CABG) (N/A)  Patient Location: SICU  Anesthesia Type: General  Level of Consciousness: sedated and Patient remains intubated per anesthesia plan  Airway & Oxygen Therapy: Patient remains intubated per anesthesia plan  Post-op Assessment: Report given to PACU RN and Post -op Vital signs reviewed and stable  Post vital signs: Reviewed and stable  Complications: No apparent anesthesia complications

## 2011-09-02 NOTE — OR Nursing (Signed)
SICU called at1150 - first call -45 min
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
## 2011-09-02 NOTE — Progress Notes (Signed)
Patient ID: [name], male   DOB: 12-03-1940, 71 y.o.   MRN: [id number]                   [address]            [name] [address]          [phone]     Day of Surgery Procedure(s) (LRB): AORTIC VALVE REPLACEMENT (AVR)/CORONARY ARTERY BYPASS GRAFTING (CABG) (N/A)  Total Length of Stay:  LOS: 0 days  BP 108/74  Pulse 73  Temp(Src) 99.1 F (37.3 C) (Oral)  Resp 13  Wt 201 lb 4.5 oz (91.301 kg)  SpO2 100%     . sodium chloride 20 mL/hr at 09/02/11 1600  . sodium chloride    . sodium chloride    . dexmedetomidine (PRECEDEX) IV infusion 0.1 mcg/kg/hr (09/02/11 1700)  . insulin (NOVOLIN-R) infusion Stopped (09/02/11 1500)  . lactated ringers 20 mL/hr at 09/02/11 1700  . nitroGLYCERIN 20 mcg/min (09/02/11 1700)  . phenylephrine (NEO-SYNEPHRINE) Adult infusion Stopped (09/02/11 1600)     Lab Results  Component Value Date   WBC 11.0* 09/02/2011   HGB 10.9* 09/02/2011   HCT 32.0* 09/02/2011   PLT 131* 09/02/2011   GLUCOSE 97 09/02/2011   ALT 18 08/30/2011   AST 31 08/30/2011   NA 140 09/02/2011   K 3.6 09/02/2011   CL 99 08/30/2011   CREATININE 1.27 08/30/2011   BUN 17 08/30/2011   CO2 26 08/30/2011   INR 1.80* 09/02/2011   HGBA1C 6.2* 08/30/2011   Stable day  Delight Ovens MD  Beeper [phone] Office [phone] 09/02/2011 6:54 PM

## 2011-09-03 ENCOUNTER — Inpatient Hospital Stay (HOSPITAL_COMMUNITY): Payer: Medicare Other

## 2011-09-03 LAB — POCT I-STAT 3, ART BLOOD GAS (G3+)
Acid-base deficit: 4 mmol/L — ABNORMAL HIGH (ref 0.0–2.0)
Acid-base deficit: 3 mmol/L — ABNORMAL HIGH (ref 0.0–2.0)
Bicarbonate: 21.7 mEq/L (ref 20.0–24.0)
Bicarbonate: 23.1 mEq/L (ref 20.0–24.0)
O2 Saturation: 99 %
Patient temperature: 101.5
Patient temperature: 100.4
TCO2: 23 mmol/L (ref 0–100)
TCO2: 24 mmol/L (ref 0–100)
pO2, Arterial: 164 mmHg — ABNORMAL HIGH (ref 80.0–100.0)

## 2011-09-03 LAB — GLUCOSE, CAPILLARY
Glucose-Capillary: 131 mg/dL — ABNORMAL HIGH (ref 70–99)
Glucose-Capillary: 139 mg/dL — ABNORMAL HIGH (ref 70–99)
Glucose-Capillary: 161 mg/dL — ABNORMAL HIGH (ref 70–99)

## 2011-09-03 LAB — CBC
HCT: 31.4 % — ABNORMAL LOW (ref 39.0–52.0)
Hemoglobin: 10.4 g/dL — ABNORMAL LOW (ref 13.0–17.0)
Hemoglobin: 10.5 g/dL — ABNORMAL LOW (ref 13.0–17.0)
MCH: 28.4 pg (ref 26.0–34.0)
MCHC: 33.8 g/dL (ref 30.0–36.0)
MCV: 84.2 fL (ref 78.0–100.0)
MCV: 84.4 fL (ref 78.0–100.0)
Platelets: 138 10*3/uL — ABNORMAL LOW (ref 150–400)
Platelets: 159 10*3/uL (ref 150–400)
RBC: 3.72 MIL/uL — ABNORMAL LOW (ref 4.22–5.81)
WBC: 12.8 10*3/uL — ABNORMAL HIGH (ref 4.0–10.5)

## 2011-09-03 LAB — BASIC METABOLIC PANEL
Calcium: 7.7 mg/dL — ABNORMAL LOW (ref 8.4–10.5)
Creatinine, Ser: 1.07 mg/dL (ref 0.50–1.35)
GFR calc non Af Amer: 68 mL/min — ABNORMAL LOW (ref >90)
Glucose, Bld: 155 mg/dL — ABNORMAL HIGH (ref 70–99)
Sodium: 138 mEq/L (ref 135–145)

## 2011-09-03 LAB — CREATININE, SERUM
GFR calc Af Amer: 50 mL/min — ABNORMAL LOW (ref >90)
GFR calc non Af Amer: 43 mL/min — ABNORMAL LOW (ref >90)

## 2011-09-03 LAB — POCT I-STAT, CHEM 8
BUN: 19 mg/dL (ref 6–23)
Chloride: 102 mEq/L (ref 96–112)
Potassium: 4.4 mEq/L (ref 3.5–5.1)
Sodium: 137 mEq/L (ref 135–145)

## 2011-09-03 MED ORDER — FUROSEMIDE 80 MG PO TABS
80.0000 mg | ORAL_TABLET | Freq: Every day | ORAL | Status: DC
  Filled 2011-09-03: qty 1

## 2011-09-03 MED ORDER — DOCUSATE SODIUM 100 MG PO CAPS
200.0000 mg | ORAL_CAPSULE | Freq: Every day | ORAL | Status: DC
  Administered 2011-09-04 – 2011-09-08 (×5): 200 mg via ORAL
  Filled 2011-09-03 (×5): qty 2

## 2011-09-03 MED ORDER — POTASSIUM CHLORIDE CRYS ER 20 MEQ PO TBCR
20.0000 meq | EXTENDED_RELEASE_TABLET | Freq: Every day | ORAL | Status: AC
  Administered 2011-09-04 – 2011-09-06 (×3): 20 meq via ORAL
  Filled 2011-09-03 (×3): qty 1

## 2011-09-03 MED ORDER — DOCUSATE SODIUM 100 MG PO CAPS: 200.0000 mg | ORAL_CAPSULE | Freq: Every day | ORAL | Status: DC

## 2011-09-03 MED ORDER — TRAMADOL HCL 50 MG PO TABS
50.0000 mg | ORAL_TABLET | Freq: Four times a day (QID) | ORAL | Status: DC | PRN
  Administered 2011-09-03 – 2011-09-08 (×18): 50 mg via ORAL
  Filled 2011-09-03 (×18): qty 1

## 2011-09-03 MED ORDER — BISACODYL 10 MG RE SUPP: 10.0000 mg | Freq: Every day | RECTAL | Status: DC | PRN

## 2011-09-03 MED ORDER — SODIUM CHLORIDE 0.9 % IJ SOLN
3.0000 mL | Freq: Two times a day (BID) | INTRAMUSCULAR | Status: DC
  Administered 2011-09-03 – 2011-09-07 (×8): 3 mL via INTRAVENOUS

## 2011-09-03 MED ORDER — BISACODYL 5 MG PO TBEC
10.0000 mg | DELAYED_RELEASE_TABLET | Freq: Every day | ORAL | Status: DC | PRN
  Administered 2011-09-05: 10 mg via ORAL
  Filled 2011-09-03: qty 2

## 2011-09-03 MED ORDER — METOCLOPRAMIDE HCL 5 MG PO TABS
5.0000 mg | ORAL_TABLET | Freq: Three times a day (TID) | ORAL | Status: AC
  Administered 2011-09-03 – 2011-09-05 (×6): 5 mg via ORAL
  Filled 2011-09-03 (×7): qty 1

## 2011-09-03 MED ORDER — PROMETHAZINE HCL 25 MG/ML IJ SOLN: 12.5000 mg | INTRAMUSCULAR | Status: DC | PRN

## 2011-09-03 MED ORDER — FUROSEMIDE 10 MG/ML IJ SOLN
80.0000 mg | Freq: Once | INTRAMUSCULAR | Status: AC
  Administered 2011-09-03: 80 mg via INTRAVENOUS
  Filled 2011-09-03: qty 8

## 2011-09-03 MED ORDER — HYDROMORPHONE HCL 2 MG PO TABS
2.0000 mg | ORAL_TABLET | ORAL | Status: DC | PRN
  Administered 2011-09-04: 2 mg via ORAL
  Filled 2011-09-03 (×2): qty 1

## 2011-09-03 MED ORDER — SODIUM CHLORIDE 0.9 % IJ SOLN: 3.0000 mL | INTRAMUSCULAR | Status: DC | PRN

## 2011-09-03 MED ORDER — ONDANSETRON HCL 4 MG PO TABS: 4.0000 mg | ORAL_TABLET | Freq: Four times a day (QID) | ORAL | Status: DC | PRN

## 2011-09-03 MED ORDER — ONDANSETRON HCL 4 MG/2ML IJ SOLN
4.0000 mg | Freq: Four times a day (QID) | INTRAMUSCULAR | Status: DC | PRN
  Administered 2011-09-03: 4 mg via INTRAVENOUS
  Filled 2011-09-03: qty 2

## 2011-09-03 MED ORDER — ACETAMINOPHEN 325 MG PO TABS: 650.0000 mg | ORAL_TABLET | Freq: Four times a day (QID) | ORAL | Status: DC | PRN

## 2011-09-03 MED ORDER — MOVING RIGHT ALONG BOOK
Freq: Once | Status: AC
  Administered 2011-09-05: 10:00:00
  Filled 2011-09-03: qty 1

## 2011-09-03 MED ORDER — SODIUM CHLORIDE 0.9 % IV SOLN: 250.0000 mL | INTRAVENOUS | Status: DC | PRN

## 2011-09-03 MED ORDER — METOPROLOL TARTRATE 25 MG PO TABS
25.0000 mg | ORAL_TABLET | Freq: Two times a day (BID) | ORAL | Status: DC
  Administered 2011-09-04 – 2011-09-08 (×9): 25 mg via ORAL
  Filled 2011-09-03 (×12): qty 1

## 2011-09-03 MED ORDER — PANTOPRAZOLE SODIUM 40 MG PO TBEC
40.0000 mg | DELAYED_RELEASE_TABLET | Freq: Every day | ORAL | Status: DC
  Administered 2011-09-04 – 2011-09-08 (×5): 40 mg via ORAL
  Filled 2011-09-03 (×5): qty 1

## 2011-09-03 MED FILL — Potassium Chloride Inj 2 mEq/ML: INTRAVENOUS | Qty: 40 | Status: AC

## 2011-09-03 MED FILL — Magnesium Sulfate Inj 50%: INTRAMUSCULAR | Qty: 10 | Status: AC

## 2011-09-03 NOTE — Progress Notes (Addendum)
1 Day Post-Op Procedure(s) (LRB): AORTIC VALVE REPLACEMENT (AVR)/CORONARY ARTERY BYPASS GRAFTING (CABG) (N/A) Subjective: Chest soreness  Objective: Vital signs in last 24 hours: Temp:  [96.4 F (35.8 C)-101.5 F (38.6 C)] 99.1 F (37.3 C) (04/26 0700) Pulse Rate:  [69-98] 98  (04/26 0700) Cardiac Rhythm:  [-] Normal sinus rhythm (04/26 0700) Resp:  [6-23] 22  (04/26 0700) BP: (92-127)/(65-86) 127/76 mmHg (04/26 0700) SpO2:  [98 %-100 %] 98 % (04/26 0700) Arterial Line BP: (90-156)/(52-85) 155/76 mmHg (04/26 0700) FiO2 (%):  [40 %-50 %] 40 % (04/26 0100) Weight:  [97.5 kg (214 lb 15.2 oz)] 97.5 kg (214 lb 15.2 oz) (04/26 0600)  Hemodynamic parameters for last 24 hours: PAP: (21-36)/(11-24) 35/23 mmHg CO:  [3.5 L/min-4.2 L/min] 4.2 L/min CI:  [1.7 L/min/m2-2.1 L/min/m2] 2.1 L/min/m2  Intake/Output from previous day: 04/25 0701 - 04/26 0700 In: 5912.3 [P.O.:90; I.V.:3832.3; Blood:900; NG/GT:20; IV Piggyback:1070] Out: 3440 [Urine:2830; Chest Tube:610] Intake/Output this shift:    General appearance: alert and cooperative Neurologic: intact Heart: regular rate and rhythm and friction rub heard, valve sounds good Lungs: clear to auscultation bilaterally Extremities: edema mild Wound: dressing dry  Lab Results:  Basename 09/03/11 0400 09/02/11 1926 09/02/11 1314  WBC 11.6* -- 11.0*  HGB 10.4* 11.2* --  HCT 30.8* 33.0* --  PLT 138* -- 131*   BMET:  Basename 09/03/11 0400 09/02/11 1926  NA 138 139  K 4.2 4.4  CL 106 107  CO2 21 --  GLUCOSE 155* 134*  BUN 12 13  CREATININE 1.07 1.30  CALCIUM 7.7* --    PT/INR:  Basename 09/02/11 1314  LABPROT 21.2*  INR 1.80*   ABG    Component Value Date/Time   PHART 7.316* 09/03/2011 0424   HCO3 23.1 09/03/2011 0424   TCO2 24 09/03/2011 0424   ACIDBASEDEF 3.0* 09/03/2011 0424   O2SAT 99.0 09/03/2011 0424   CBG (last 3)   Basename 09/03/11 0342 09/02/11 2355 09/02/11 1922  GLUCAP 111* 122* 130*   CXR: mild left base  atel  ECG:  NSR, 1st degree AV block, diffuse ST elevation c/w pericarditis  Assessment/Plan: S/P Procedure(s) (LRB): AORTIC VALVE REPLACEMENT (AVR)/CORONARY ARTERY BYPASS GRAFTING (CABG) (N/A) Mobilize Diuresis d/c tubes/lines Plan for transfer to step-down: see transfer orders Acute blood loss amemia:  observe  LOS: 1 day    Hakeen Shipes K 09/03/2011

## 2011-09-03 NOTE — Progress Notes (Signed)
Patient Name: [name] Date of Encounter: 09/03/2011    SUBJECTIVE:Made it through surgery without problems.  TELEMETRY:  NSR: Filed Vitals:   09/03/11 0500 09/03/11 0600 09/03/11 0700 09/03/11 0800  BP: 115/70 118/79 127/76 124/75  Pulse: 91 91 98 89  Temp: 100 F (37.8 C) 99.9 F (37.7 C) 99.1 F (37.3 C)   TempSrc:  Core (Comment)    Resp: 6 19 22 18   Weight:  97.5 kg (214 lb 15.2 oz)    SpO2: 100% 100% 98% 94%    Intake/Output Summary (Last 24 hours) at 09/03/11 0927 Last data filed at 09/03/11 0800  Gross per 24 hour  Intake 5000.31 ml  Output   3300 ml  Net 1700.31 ml    LABS: Basic Metabolic Panel:  Basename 09/03/11 0400 09/02/11 1926  NA 138 139  K 4.2 4.4  CL 106 107  CO2 21 --  GLUCOSE 155* 134*  BUN 12 13  CREATININE 1.07 1.30  CALCIUM 7.7* --  MG 2.2 --  PHOS -- --   CBC:  Basename 09/03/11 0400 09/02/11 1926 09/02/11 1314  WBC 11.6* -- 11.0*  NEUTROABS -- -- --  HGB 10.4* 11.2* --  HCT 30.8* 33.0* --  MCV 84.2 -- 83.1  PLT 138* -- 131*    Radiology/Studies: CXR IMPRESSION:  1. Extubation. Slight decrease in lung volumes with increasing  bibasilar atelectasis.  2. Otherwise stable support apparatus, without pneumothorax.  ECG: Nsr with diffuse ST elevation  Physical Exam: Blood pressure 124/75, pulse 89, temperature 99.1 F (37.3 C), temperature source Core (Comment), resp. rate 18, weight 97.5 kg (214 lb 15.2 oz), SpO2 94.00%. Weight change: 6.2 kg (13 lb 10.7 oz)   Faint pericardial rub and 1/6 systolic murmur. No AR  ASSESSMENT:  1. S/P AVR with bioprosthesis functioning normally by auscultation.  2. Post op pericarditis  3. Acute diastolic heart failure due to volume loading.   Plan:  1. Watch for atrial fibrillation. 2. Diuresis  Signed, Lesleigh Noe 09/03/2011, 9:27 AM

## 2011-09-03 NOTE — Progress Notes (Signed)
   CARE MANAGEMENT NOTE 09/03/2011  Patient:  AMALIO, LOE   Account Number:  [id number]  Date Initiated:  09/02/2011  Documentation initiated by:  [facility]  Subjective/Objective Assessment:   Post op AVR and CABG x1 ON 4/25  Has Spouse.     Action/Plan:   PTA, PT INDEPENDENT, LIVES WITH SPOUSE. WIFE TO PROVIDE CARE AT DC.  WILL FOLLOW FOR HOME NEEDS AS PT PROGRESSES.   Anticipated DC Date:  09/07/2011   Anticipated DC Plan:  HOME W HOME HEALTH SERVICES      DC Planning Services  CM consult      Choice offered to / List presented to:             Status of service:  In process, will continue to follow Medicare Important Message given?   (If response is "NO", the following Medicare IM given date fields will be blank) Date Medicare IM given:   Date Additional Medicare IM given:    Discharge Disposition:    Per UR Regulation:  Reviewed for med. necessity/level of care/duration of stay  If discussed at Long Length of Stay Meetings, dates discussed:    Comments:    Jerrell Belfast, RN, BSN Phone #[phone]

## 2011-09-03 NOTE — Progress Notes (Signed)
Patient ID: [name], male   DOB: [birth year], 71 y.o.   MRN: [id number] Pt with severe nausea BP 113/74  Pulse 96  Temp(Src) 98 F (36.7 C) (Oral)  Resp 16  Wt 214 lb 15.2 oz (97.5 kg)  SpO2 96%  Intake/Output Summary (Last 24 hours) at 09/03/11 1835 Last data filed at 09/03/11 1800  Gross per 24 hour  Intake 1602.31 ml  Output   2675 ml  Net -1072.69 ml   Creatinine up from 1.0 to 1.5 Will hold transfer for now

## 2011-09-03 NOTE — Procedures (Signed)
Extubation Procedure Note  Patient Details:   Name: [name] DOB: Sep 07, 1940 MRN: [id number]   Airway Documentation:   Patient extubated to 4 lpm nasal cannula.  VC 800 ml, NIF -30, able to hold head off bed 10 seconds.  Able to breathe around deflated cuff and able to vocalize post procedure.  Tolerated well, no complications.   Evaluation  O2 sats: stable throughout Complications: No apparent complications Patient did tolerate procedure well. Bilateral Breath Sounds: Clear   Yes  [name], Aloha Gell 09/03/2011, 2:15 AM

## 2011-09-03 NOTE — Clinical Documentation Improvement (Signed)
Anemia Blood Loss Clarification  THIS DOCUMENT IS NOT A PERMANENT PART OF THE MEDICAL RECORD  RESPOND TO THE THIS QUERY, FOLLOW THE INSTRUCTIONS BELOW:  1. If needed, update documentation for the patient's encounter via the notes activity.  2. Access this query again and click edit on the In [employer].  3. After updating, or not, click F2 to complete all highlighted (required) fields concerning your review. Select "additional documentation in the medical record" OR "no additional documentation provided".  4. Click Sign note button.  5. The deficiency will fall out of your In Basket *Please let us know if you are not able to complete this workflow by phone or e-mail (listed below).        09/03/11  Dear Consepcion Hearing Marton Redwood  In an effort to better capture your patient's severity of illness, reflect appropriate length of stay and utilization of resources, a review of the patient medical record has revealed the following indicators.    Based on your clinical judgment, please clarify and document in a progress note and/or discharge summary the clinical condition associated with the following supporting information:  In responding to this query please exercise your independent judgment.  The fact that a query is asked, does not imply that any particular answer is desired or expected.  Possible Clinical Conditions?   " Expected Acute Blood Loss Anemia  " Acute Blood Loss Anemia  " Acute on chronic blood loss anemia  " Precipitous drop in Hematocrit  " Other Condition  " Cannot Clinically Determine    Diagnostics: H&H: 4/25:  11.2/33.0 4/25:    8.6/25.1  plasma expanders: 4/25:  Cell saver: 4/25:  Albumin human:    Thank You,  Marciano Sequin,  Clinical Documentation Specialist:  Pager: [phone]  Health Information Management [place]

## 2011-09-04 LAB — TYPE AND SCREEN
Antibody Screen: NEGATIVE
Unit division: 0

## 2011-09-04 LAB — GLUCOSE, CAPILLARY: Glucose-Capillary: 138 mg/dL — ABNORMAL HIGH (ref 70–99)

## 2011-09-04 LAB — BASIC METABOLIC PANEL
GFR calc non Af Amer: 42 mL/min — ABNORMAL LOW (ref >90)
Glucose, Bld: 157 mg/dL — ABNORMAL HIGH (ref 70–99)
Potassium: 4 mEq/L (ref 3.5–5.1)
Sodium: 137 mEq/L (ref 135–145)

## 2011-09-04 LAB — CBC
Hemoglobin: 9.3 g/dL — ABNORMAL LOW (ref 13.0–17.0)
MCHC: 33.3 g/dL (ref 30.0–36.0)
RBC: 3.33 MIL/uL — ABNORMAL LOW (ref 4.22–5.81)

## 2011-09-04 MED ORDER — ASPIRIN 325 MG PO TABS
325.0000 mg | ORAL_TABLET | Freq: Every day | ORAL | Status: DC
  Administered 2011-09-04 – 2011-09-08 (×5): 325 mg via ORAL
  Filled 2011-09-04 (×5): qty 1

## 2011-09-04 MED ORDER — ENOXAPARIN SODIUM 40 MG/0.4ML ~~LOC~~ SOLN
40.0000 mg | SUBCUTANEOUS | Status: DC
  Administered 2011-09-04 – 2011-09-08 (×5): 40 mg via SUBCUTANEOUS
  Filled 2011-09-04 (×5): qty 0.4

## 2011-09-04 MED ORDER — FUROSEMIDE 80 MG PO TABS
80.0000 mg | ORAL_TABLET | Freq: Every day | ORAL | Status: AC
  Administered 2011-09-05 – 2011-09-07 (×3): 80 mg via ORAL
  Filled 2011-09-04 (×3): qty 1

## 2011-09-04 MED ORDER — FUROSEMIDE 10 MG/ML IJ SOLN
40.0000 mg | Freq: Once | INTRAMUSCULAR | Status: AC
  Administered 2011-09-04: 40 mg via INTRAVENOUS
  Filled 2011-09-04: qty 4

## 2011-09-04 NOTE — Progress Notes (Signed)
2 Days Post-Op Procedure(s) (LRB): AORTIC VALVE REPLACEMENT (AVR)/CORONARY ARTERY BYPASS GRAFTING (CABG) (N/A) Subjective: Feels better this AM Nausea improved  Objective: Vital signs in last 24 hours: Temp:  [97.5 F (36.4 C)-98.7 F (37.1 C)] 98 F (36.7 C) (04/27 0808) Pulse Rate:  [89-96] 89  (04/27 0800) Cardiac Rhythm:  [-] Normal sinus rhythm (04/27 0800) Resp:  [11-23] 22  (04/27 0800) BP: (94-127)/(57-74) 127/67 mmHg (04/27 0800) SpO2:  [93 %-98 %] 93 % (04/27 0800) Weight:  [215 lb 9.8 oz (97.8 kg)] 215 lb 9.8 oz (97.8 kg) (04/27 0600)  Hemodynamic parameters for last 24 hours:    Intake/Output from previous day: 04/26 0701 - 04/27 0700 In: 720 [P.O.:510; I.V.:150; IV Piggyback:60] Out: 1940 [Urine:1910; Chest Tube:30] Intake/Output this shift: Total I/O In: -  Out: 40 [Urine:40]  General appearance: alert and no distress Neurologic: intact Heart: regular rate and rhythm Lungs: diminished breath sounds bibasilar Abdomen: normal findings: soft, non-tender  Lab Results:  Basename 09/04/11 0417 09/03/11 1643  WBC 11.4* 12.8*  HGB 9.3* 10.5*  HCT 27.9* 31.4*  PLT 138* 159   BMET:  Basename 09/04/11 0417 09/03/11 1643 09/03/11 1626 09/03/11 0400  NA 137 -- 137 --  K 4.0 -- 4.4 --  CL 101 -- 102 --  CO2 28 -- -- 21  GLUCOSE 157* -- 187* --  BUN 22 -- 19 --  CREATININE 1.59* 1.55* -- --  CALCIUM 8.1* -- -- 7.7*    PT/INR:  Basename 09/02/11 1314  LABPROT 21.2*  INR 1.80*   ABG    Component Value Date/Time   PHART 7.316* 09/03/2011 0424   HCO3 23.1 09/03/2011 0424   TCO2 24 09/03/2011 1626   ACIDBASEDEF 3.0* 09/03/2011 0424   O2SAT 99.0 09/03/2011 0424   CBG (last 3)   Basename 09/04/11 0805 09/03/11 2354 09/03/11 1927  GLUCAP 138* 131* 157*    Assessment/Plan: S/P Procedure(s) (LRB): AORTIC VALVE REPLACEMENT (AVR)/CORONARY ARTERY BYPASS GRAFTING (CABG) (N/A) Plan for transfer to step-down: see transfer orders CV- stable RESP- continue  pulmonary hygiene RENAL- creatinine has levelled off at 1.5, IV lasix this AM  D/c foley on transfer CBG well controlled   LOS: 2 days    [name] C 09/04/2011

## 2011-09-04 NOTE — Progress Notes (Signed)
Subjective:  71 year old with AVR, CABG, pericarditis  Objective:  Vital Signs in the last 24 hours: Temp:  [97.5 F (36.4 C)-98.7 F (37.1 C)] 98 F (36.7 C) (04/27 0808) Pulse Rate:  [89-96] 89  (04/27 0800) Resp:  [11-23] 22  (04/27 0800) BP: (94-127)/(57-74) 127/67 mmHg (04/27 0800) SpO2:  [93 %-98 %] 93 % (04/27 0800) Weight:  [97.8 kg (215 lb 9.8 oz)] 97.8 kg (215 lb 9.8 oz) (04/27 0600)  Intake/Output from previous day: 04/26 0701 - 04/27 0700 In: 720 [P.O.:510; I.V.:150; IV Piggyback:60] Out: 1940 [Urine:1910; Chest Tube:30]   Physical Exam: General: Well developed, well nourished, in no acute distress. Head:  Normocephalic and atraumatic. Lungs: Clear to auscultation and percussion. Heart: Normal S1 and split S2.  Do not appreciate rub this am.  Pulses: Pulses normal in all 4 extremities. Abdomen: soft, non-tender, positive bowel sounds. Extremities: No clubbing or cyanosis. No significant edema. Neurologic: Alert and oriented x 3.    Lab Results:  Basename 09/04/11 0417 09/03/11 1643  WBC 11.4* 12.8*  HGB 9.3* 10.5*  PLT 138* 159    Basename 09/04/11 0417 09/03/11 1643 09/03/11 1626 09/03/11 0400  NA 137 -- 137 --  K 4.0 -- 4.4 --  CL 101 -- 102 --  CO2 28 -- -- 21  GLUCOSE 157* -- 187* --  BUN 22 -- 19 --  CREATININE 1.59* 1.55* -- --    Imaging: Dg Chest Portable 1 View In Am  09/03/2011  *RADIOLOGY REPORT*  Clinical Data: Postop for median sternotomy.  PORTABLE CHEST - 1 VIEW  Comparison: 1 day prior  Findings: Removal of endotracheal and nasogastric tubes.  Left IJ Swan-Ganz catheter unchanged, with tip at proximal right pulmonary artery. Prior median sternotomy.  Left chest tube and mediastinal drain are unchanged in position.  Cardiomegaly accentuated by AP portable technique.  No pleural effusion or pneumothorax.  Lung volumes are slightly diminished. No congestive failure.  Patchy bibasilar atelectasis is slightly increased.  IMPRESSION:  1.   Extubation.  Slight decrease in lung volumes with increasing bibasilar atelectasis. 2.  Otherwise stable support apparatus, without pneumothorax.  Original Report Authenticated By: Consuello Bossier, M.D.   Dg Chest Portable 1 View  09/02/2011  *RADIOLOGY REPORT*  Clinical Data: Status post CABG.  PORTABLE CHEST - 1 VIEW  Comparison: PA and lateral chest 08/30/2011.  Findings: Left IJ approach Swan-Ganz catheter is in place with the tip in the right main pulmonary artery.  Chest tubes are also seen. Endotracheal tube is in good position with the tip just below the clavicular heads.  NG tube tip is in the mid to distal esophagus.  There is no pneumothorax.  Heart size is upper normal.  Mild atelectasis is seen bilaterally.  IMPRESSION:  1.  NG tube is in the mid to distal esophagus and should be advanced 14 cm for better positioning.  Support apparatus otherwise in good position. 2.  No pneumothorax with mild bilateral atelectasis seen.  Original Report Authenticated By: Bernadene Bell. Maricela Curet, M.D.     Telemetry: No afib, SR first degree AVB.  Personally viewed.   EKG:  Diffuse ST elevation consistent with pericarditis.   Assessment/Plan:   AVR - stable post op  CAD/CABG - OM SVG - stable. ASA-will resume  Post op pericarditis - on ECG, Dr. Katrinka Blazing heard faint rub yesterday. No rub today. ASA. Monitor. If develops worsening pleuritic CP - consider NSAID.   No evidence of AFIB. Continue metoprolol.  DVT proph - Lovenox.   Fluid overload - lasix. Creat mildly increased. Monitor.   HL - on fenofibrate. Will check FLP. LDL goal 70. May need statin with CAD.   Princes Finger 09/04/2011, 9:23 AM

## 2011-09-05 LAB — LIPID PANEL
LDL Cholesterol: 56 mg/dL (ref 0–99)
VLDL: 26 mg/dL (ref 0–40)

## 2011-09-05 LAB — BASIC METABOLIC PANEL
BUN: 23 mg/dL (ref 6–23)
CO2: 27 mEq/L (ref 19–32)
Calcium: 8.2 mg/dL — ABNORMAL LOW (ref 8.4–10.5)
Creatinine, Ser: 1.37 mg/dL — ABNORMAL HIGH (ref 0.50–1.35)
Glucose, Bld: 111 mg/dL — ABNORMAL HIGH (ref 70–99)

## 2011-09-05 LAB — CBC
HCT: 25.8 % — ABNORMAL LOW (ref 39.0–52.0)
Hemoglobin: 8.6 g/dL — ABNORMAL LOW (ref 13.0–17.0)
MCH: 27.8 pg (ref 26.0–34.0)
MCV: 83.5 fL (ref 78.0–100.0)
Platelets: 159 10*3/uL (ref 150–400)
RBC: 3.09 MIL/uL — ABNORMAL LOW (ref 4.22–5.81)

## 2011-09-05 NOTE — Progress Notes (Addendum)
[address]            [place] [address]          [phone]     3 Days Post-Op  Procedure(s) (LRB): AORTIC VALVE REPLACEMENT (AVR)/CORONARY ARTERY BYPASS GRAFTING (CABG) (N/A) Subjective: Feeling better each day, mild shortness of breath  Objective  Telemetry SR, 1AVB  Temp:  [97.7 F (36.5 C)-99.2 F (37.3 C)] 98.8 F (37.1 C) (04/28 0152) Pulse Rate:  [80-97] 91  (04/28 0152) Resp:  [16-23] 18  (04/28 0152) BP: (100-137)/(53-77) 115/70 mmHg (04/28 0152) SpO2:  [87 %-98 %] 98 % (04/28 0152) Weight:  [210 lb 15.7 oz (95.7 kg)] 210 lb 15.7 oz (95.7 kg) (04/28 0453)   Intake/Output Summary (Last 24 hours) at 09/05/11 0832 Last data filed at 09/04/11 2345  Gross per 24 hour  Intake    304 ml  Output    605 ml  Net   -301 ml       General appearance: alert, cooperative and no distress Heart: regular rate and rhythm, S1, S2 normal and no murmur Lungs: diminished in bases Abdomen: soft, + BS, mild distension Extremities: + LE edema Wound: incisions healing well  Lab Results:  Basename 09/05/11 0544 09/04/11 0417 09/03/11 1643 09/03/11 0400  NA 132* 137 -- --  K 4.3 4.0 -- --  CL 98 101 -- --  CO2 27 28 -- --  GLUCOSE 111* 157* -- --  BUN 23 22 -- --  CREATININE 1.37* 1.59* -- --  CALCIUM 8.2* 8.1* -- --  MG -- -- 2.3 2.2  PHOS -- -- -- --   No results found for this basename: AST:2,ALT:2,ALKPHOS:2,BILITOT:2,PROT:2,ALBUMIN:2 in the last 72 hours No results found for this basename: LIPASE:2,AMYLASE:2 in the last 72 hours  Basename 09/05/11 0544 09/04/11 0417  WBC 10.3 11.4*  NEUTROABS -- --  HGB 8.6* 9.3*  HCT 25.8* 27.9*  MCV 83.5 83.8  PLT 159 138*   No results found for this basename: CKTOTAL:4,CKMB:4,TROPONINI:4 in the last 72 hours No components found with this basename: POCBNP:3 No results found for this basename: DDIMER in the last 72 hours No results found for this basename: HGBA1C in the last 72  hours  Basename 09/05/11 0544  CHOL 105  HDL 23*  LDLCALC 56  TRIG 213  CHOLHDL 4.6   No results found for this basename: TSH,T4TOTAL,FREET3,T3FREE,THYROIDAB in the last 72 hours No results found for this basename: VITAMINB12,FOLATE,FERRITIN,TIBC,IRON,RETICCTPCT in the last 72 hours  Medications: Scheduled    . aspirin  325 mg Oral Daily  . cholecalciferol  2,000 Units Oral Daily  . docusate sodium  200 mg Oral Daily  . enoxaparin (LOVENOX) injection  40 mg Subcutaneous Q24H  . fenofibrate  160 mg Oral Daily  . furosemide  40 mg Intravenous Once  . furosemide  80 mg Oral Daily  . metoCLOPramide  5 mg Oral TID AC  . metoprolol tartrate  25 mg Oral BID  . moving right along book   Does not apply Once  . pantoprazole  40 mg Oral QAC breakfast  . potassium chloride  20 mEq Oral Daily  . sodium chloride  3 mL Intravenous Q12H  . Tamsulosin HCl  0.4 mg Oral Daily  . DISCONTD: furosemide  80 mg Oral Daily     Radiology/Studies:  No results found.  INR: Will add last result for INR,  ABG once components are confirmed Will add last 4 CBG results once components are confirmed  Assessment/Plan: S/P Procedure(s) (LRB): AORTIC VALVE REPLACEMENT (AVR)/CORONARY ARTERY BYPASS GRAFTING (CABG) (N/A)  1 doing well 2 cont diuresis, creat improved, monitot sodium 3 pulm toilet/wean O2 4 mobilize 5 ABL anemia- monitor 6 cbg- 118-141 range yesterday   LOS: 3 days    GOLD,WAYNE E 4/28/20138:32 AM    Patient seen and examined. Agree with above

## 2011-09-05 NOTE — Progress Notes (Signed)
Courtesy Visit for Dr. Katrinka Blazing  Improved. Spoke with family.  AVR/single CABG-OM  No new complaints.

## 2011-09-06 MED ORDER — FERROUS GLUCONATE 324 (38 FE) MG PO TABS: 324.0000 mg | ORAL_TABLET | Freq: Every day | ORAL | Status: DC

## 2011-09-06 MED ORDER — POTASSIUM CHLORIDE CRYS ER 20 MEQ PO TBCR: 20.0000 meq | EXTENDED_RELEASE_TABLET | Freq: Every day | ORAL | Status: DC

## 2011-09-06 MED ORDER — FUROSEMIDE 40 MG PO TABS: 40.0000 mg | ORAL_TABLET | Freq: Every day | ORAL | Status: DC

## 2011-09-06 MED ORDER — HYDROMORPHONE HCL 2 MG PO TABS: 2.0000 mg | ORAL_TABLET | ORAL | Status: DC | PRN

## 2011-09-06 MED ORDER — METOPROLOL TARTRATE 25 MG PO TABS: 25.0000 mg | ORAL_TABLET | Freq: Two times a day (BID) | ORAL | Status: DC

## 2011-09-06 MED ORDER — FERROUS GLUCONATE 324 (38 FE) MG PO TABS
324.0000 mg | ORAL_TABLET | Freq: Every day | ORAL | Status: DC
  Administered 2011-09-07 – 2011-09-08 (×2): 324 mg via ORAL
  Filled 2011-09-06 (×4): qty 1

## 2011-09-06 MED ORDER — ASPIRIN 325 MG PO TABS: 325.0000 mg | ORAL_TABLET | Freq: Every day | ORAL | Status: DC

## 2011-09-06 MED ORDER — POLYETHYLENE GLYCOL 3350 17 G PO PACK
17.0000 g | PACK | Freq: Every day | ORAL | Status: DC | PRN
  Filled 2011-09-06: qty 1

## 2011-09-06 MED ORDER — TRAMADOL HCL 50 MG PO TABS: 50.0000 mg | ORAL_TABLET | Freq: Four times a day (QID) | ORAL | Status: DC | PRN

## 2011-09-06 MED FILL — Lidocaine HCl IV Inj 20 MG/ML: INTRAVENOUS | Qty: 5 | Status: AC

## 2011-09-06 MED FILL — Mannitol IV Soln 20%: INTRAVENOUS | Qty: 500 | Status: AC

## 2011-09-06 MED FILL — Sodium Chloride Irrigation Soln 0.9%: Qty: 3000 | Status: AC

## 2011-09-06 MED FILL — Sodium Chloride IV Soln 0.9%: INTRAVENOUS | Qty: 1000 | Status: AC

## 2011-09-06 MED FILL — Electrolyte-R (PH 7.4) Solution: INTRAVENOUS | Qty: 5000 | Status: AC

## 2011-09-06 MED FILL — Heparin Sodium (Porcine) Inj 1000 Unit/ML: INTRAMUSCULAR | Qty: 30 | Status: AC

## 2011-09-06 MED FILL — Heparin Sodium (Porcine) Inj 1000 Unit/ML: INTRAMUSCULAR | Qty: 10 | Status: AC

## 2011-09-06 MED FILL — Sodium Bicarbonate IV Soln 8.4%: INTRAVENOUS | Qty: 50 | Status: AC

## 2011-09-06 NOTE — Progress Notes (Signed)
Pt transferred from [facility] to 2000 bed.  Report given to RN.  Pt ambulated with min assist and RW on 2L approx 100 ft from room to room.   To recliner with call bell in reach and family present.

## 2011-09-06 NOTE — Progress Notes (Signed)
UR Completed.  [name] Jane [phone] 09/06/2011  

## 2011-09-06 NOTE — Discharge Instructions (Signed)
Aortic Valve Replacement Care After Read the instructions outlined below and refer to this sheet for the next few weeks. These discharge instructions provide you with general information on caring for yourself after you leave the hospital. Your surgeon may also give you specific instructions. While your treatment has been planned according to the most current medical practices available, unavoidable complications occasionally occur. If you have any problems or questions after discharge, please call your surgeon. AFTER THE PROCEDURE  Full recovery from heart valve surgery can take several months.   Blood thinning (anticoagulation) treatment with warfarin is often prescribed for 6 weeks to 3 months after surgery for those with biological valves. It is prescribed for life for those with mechanical valves.   Recovery includes healing of the surgical incision. There is a gradual building of stamina and exercise abilities. An exercise program under the direction of a physical therapist may be recommended.   Once you have an artificial valve, your heart function and your life will return to normal. You usually feel better after surgery. Shortness of breath and fatigue should lessen. If your heart was already severely damaged before your surgery, you may continue to have problems.   You can usually resume most of your normal activities. You will have to continue to monitor your condition. You need to watch out for blood clots and infections.   Artificial valves need to be replaced after a period of time. It is important that you see your caregiver regularly.   Some individuals with an aortic valve replacement need to take antibiotics before having dental work or other surgical procedures. This is called prophylactic antibiotic treatment. These drugs help to prevent infective endocarditis. Antibiotics are only recommended for individuals with the highest risk for developing infective endocarditis. Let your  dentist and your caregiver know if you have a history of any of the following so that the necessary precautions can be taken:   A VSD.   A repaired VSD.   Endocarditis in the past.   An artificial (prosthetic) heart valve.  HOME CARE INSTRUCTIONS   Use all medications as prescribed.   Take your temperature every morning for the first week after surgery. Record these.   Weigh yourself every morning for at least the first week after surgery and record.   Do not lift more than 10 pounds (4.5 kg) until your breastbone (sternum) has healed. Avoid all activities which would place strain on your incision.   You may shower as soon as directed by your caregiver after surgery. Pat incisions dry. Do not rub incisions with washcloth or towel.   Avoid driving for 4 to 6 weeks following surgery or as instructed.   Use your elastic stockings during the day. You should wear the stockings for at least 2 weeks after discharge or longer if your ankles are swollen. The stockings help blood flow and help reduce swelling in the legs. It is easiest to put the stockings on before you get out of bed in the morning. They should fit snugly.  Pain Control  If a prescription was given for a pain reliever, please follow your doctor's directions.   If the pain is not relieved by your medicine, becomes worse, or you have difficulty breathing, call your surgeon.  Activity  Take frequent rest periods throughout the day.   Wait one week before returning to strenuous activities such as heavy lifting (more than 10 pounds), pushing or pulling.   Talk with your doctor about when you may  return to work and your exercise routine.   Do not drive while taking prescription pain medication.  Nutrition  You may resume your normal diet.   Drink plenty of fluids (6-8 glasses a day).   Eat a well-balanced diet.   Call your caregiver for persistent nausea or vomiting.  Elimination Your normal bowel function should  return. If constipation should occur, you may:  Take a mild laxative.   Add fruit and bran to your diet.   Drink more fluids.   Call your doctor if constipation is not relieved.  SEEK IMMEDIATE MEDICAL CARE IF:   You develop chest pain which is not coming from your surgical cut (incision).   You develop shortness of breath or have difficulty breathing.   You develop a temperature over 101 F (38.3 C).   You have a sudden weight gain. Let your caregiver know what the weight gain is.   You develop a rash.   You develop any reaction or side effects to medications given.   You have increased bleeding from wounds.   You see redness, swelling, or have increasing pain in wounds.   You have pus coming from your wound.   You develop lightheadedness or feel faint.  Document Released: 11/12/2004 Document Revised: 04/15/2011 Document Reviewed: 02/03/2005 [facility] Patient Information 2012 [place], [place].

## 2011-09-06 NOTE — Progress Notes (Addendum)
4 Days Post-Op Procedure(s) (LRB): AORTIC VALVE REPLACEMENT (AVR)/CORONARY ARTERY BYPASS GRAFTING (CABG) (N/A)  Subjective: Patient without complaints this am.  Objective: Vital signs in last 24 hours: Patient Vitals for the past 24 hrs:  BP Temp Temp src Pulse Resp SpO2 Weight  09/06/11 0446 - - - - - - 207 lb 14.3 oz (94.3 kg)  09/06/11 0439 133/79 mmHg 98.6 F (37 C) Oral 88  18  94 % -  09/05/11 2038 121/81 mmHg 99.4 F (37.4 C) Oral 96  17  95 % -  09/05/11 1359 138/87 mmHg 99.3 F (37.4 C) Oral 103  18  94 % -  09/05/11 1003 128/76 mmHg - - 106  - - -   Pre op weight  91.3 kg Current Weight  09/06/11 207 lb 14.3 oz (94.3 kg)      Intake/Output from previous day: 04/28 0701 - 04/29 0700 In: 240 [P.O.:240] Out: -    Physical Exam:  Cardiovascular: RRR; no murmurs, gallops, or rubs. Pulmonary: Slightly decreased at bases bilaterally; no rales, wheezes, or rhonchi. Abdomen: Soft, non tender, bowel sounds present. Extremities: Mild bilateral lower extremity edema. Wounds: Clean and dry.  No erythema or signs of infection.  Lab Results: CBC: Basename 09/05/11 0544 09/04/11 0417  WBC 10.3 11.4*  HGB 8.6* 9.3*  HCT 25.8* 27.9*  PLT 159 138*   BMET:  Basename 09/05/11 0544 09/04/11 0417  NA 132* 137  K 4.3 4.0  CL 98 101  CO2 27 28  GLUCOSE 111* 157*  BUN 23 22  CREATININE 1.37* 1.59*  CALCIUM 8.2* 8.1*    PT/INR: No results found for this basename: LABPROT,INR in the last 72 hours ABG:  INR: Will add last result for INR, ABG once components are confirmed Will add last 4 CBG results once components are confirmed  Assessment/Plan:  1. CV - SR.Continue Lopressor 25 bid.Pericarditis.Continue with ECASA. Doubt need for NSAID as no rub appreciated this am 2.  Pulmonary - Encourage incentive spirometer.Wean O2 as tolerates. 3. Volume Overload - Continue with diuresis.On Lasix 80 daily. 4.  Acute blood loss anemia - Last H/H 8.6/25.8. Start  Ferrous. 5.Remove EPW in am 6.Possible discharge in 1-2 days if off O2.   ZIMMERMAN,DONIELLE MPA-C 09/06/2011   agree with above. Looks great today Creatinine almost back to baseline

## 2011-09-06 NOTE — Discharge Summary (Signed)
[address]            [name] [address]          [phone]         Discharge Summary  Name: [name] DOB: [birth year] 71 y.o. MRN: [id number]  Admission Date: 09/02/2011 Discharge Date: 09/08/2011   Admitting Diagnosis:  Severe aortic stenosis  Single vessel coronary artery disease  Discharge Diagnosis:   Severe aortic stenosis  Single vessel coronary artery disease  Hypertension  Hyperlipidemia  Expected postoperative blood loss anemia  Procedures:  AORTIC VALVE REPLACEMENT (27mm Edwards Magna Ease pericardial tissue valve)  CORONARY ARTERY BYPASS GRAFTING (CABG) x 1 (Saphenous vein graft to obtuse marginal), Endoscopic vein harvest right thigh on 09/02/2011   HPI:  The patient is a 71 y.o. male with known aortic stenosis that has been followed by Dr. Katrinka Blazing with annual echocardiogram. During his most recent followup visit on 07/21/2011 he reported having some intermittent left anterior chest discomfort over the past year. He said this occurs with rest and with mild exertion and resolves without treatment. He had a followup echocardiogram on 07/14/2011 that showed progression of his aortic stenosis to severe with an aortic valve area of 0.65-0.73 with a mean gradient of 38 mm mercury and a peak gradient of 63 mm mercury. Left ventricular ejection fraction was 60-65%. There is moderate concentric left ventricular hypertrophy. There is mild to moderate mitral valve regurgitation. Aortic valve appeared to be bicuspid. There is mild aortic root dilatation with aortic root diameter measured at 3.7 cm. There is mild pulmonic and tricuspid regurgitation. He subsequently underwent cardiac catheterization in preparation for surgery which showed severe aortic stenosis with a 75% left circumflex stenosis and insignificant 30-50% right coronary stenosis. He was referred to Dr. Evelene Croon for surgical evaluation. Dr. Laneta Simmers agreed with the  need for single-vessel CABG and aortic valve replacement. All risks, benefits and alternatives of surgery were explained in detail, and the patient agreed to proceed.   Hospital Course:  The patient was admitted to [facility] on 09/02/2011. The patient was taken to the operating room and underwent the above procedure. Intraoperative TEE revealed only mild mitral regurgitation, therefore no intervention was performed on the mitral valve.   The postoperative course has been uneventful. He has had a mild acute blood loss anemia which has not required transfusion. He has remained afebrile and his vital signs have been stable. He was started on Lasix for a mild volume overload and is responding well. He is tolerating a regular diet. He is ambulating in the halls with cardiac rehabilitation without difficulty. He did desat into the 80's (on room air) with ambulation.He was still requiring a couple liters of oxygen via nasal cannula. His O2 sat at rest was above 97%.He was able to be weaned to room air on 09/08/2011. He is surgically stable for discharge today.  Recent vital signs:  Filed Vitals:   09/08/2011  BP: 133/83  Pulse: 88  Temp: 98.5   Resp: 18    Recent laboratory studies:  CBC: Basename 09/05/11 0544 09/04/11 0417  WBC 10.3 11.4*  HGB 8.6* 9.3*  HCT 25.8* 27.9*  PLT 159 138*   BMET:  Basename 09/05/11 0544 09/04/11 0417  NA 132* 137  K 4.3 4.0  CL 98 101  CO2 27 28  GLUCOSE 111* 157*  BUN 23 22  CREATININE  1.37* 1.59*  CALCIUM 8.2* 8.1*    PT/INR: No results found for this basename: LABPROT,INR in the last 72 hours  Discharge Medications:   Medication List  As of 09/06/2011 10:10 AM   Medication List  As of 09/08/2011  8:55 AM   STOP taking these medications         losartan-hydrochlorothiazide 100-25 MG per tablet         TAKE these medications         aspirin 325 MG tablet   Take 1 tablet (325 mg total) by mouth daily.      carboxymethylcellulose 1 % ophthalmic  solution   Apply 1 drop to eye 2 (two) times daily as needed.      ferrous gluconate 324 MG tablet   Commonly known as: FERGON   Take 1 tablet (324 mg total) by mouth daily with breakfast.      furosemide 40 MG tablet   Commonly known as: LASIX   Take 1 tablet (40 mg total) by mouth daily. For 5 days then stop.      metoprolol tartrate 25 MG tablet   Commonly known as: LOPRESSOR   Take 1 tablet (25 mg total) by mouth 2 (two) times daily.      multivitamin tablet   Take 1 tablet by mouth daily.      OMEGA 3-6-9 COMPLEX PO   Take 1 capsule by mouth daily.      potassium chloride SA 20 MEQ tablet   Commonly known as: K-DUR,KLOR-CON   Take 1 tablet (20 mEq total) by mouth daily. For 5 days then stop.      Tamsulosin HCl 0.4 MG Caps   Commonly known as: FLOMAX   Take 0.4 mg by mouth daily.      traMADol 50 MG tablet   Commonly known as: ULTRAM   Take 1 tablet (50 mg total) by mouth every 6 (six) hours as needed for pain.      TRILIPIX 135 MG capsule   Generic drug: Choline Fenofibrate   Take 135 mg by mouth daily.      VITAMIN C PO   Take 1 tablet by mouth daily.      Vitamin D 2000 UNITS Caps   Take 1 capsule by mouth daily.             Discharge Instructions:  The patient is to refrain from driving, heavy lifting or strenuous activity.  May shower daily and clean incisions with soap and water.  May resume regular diet.  Discharge Orders    Future Appointments: Provider: Department: Dept Phone: Center:   09/28/2011 1:30 PM Alleen Borne, MD [facility] [phone] [facility]     Future Orders Please Complete By Expires   Amb Referral to Cardiac Rehabilitation         Follow-up Information    Follow up with Lesleigh Noe, MD. Schedule an appointment as soon as possible for a visit in 2 weeks.   Contact information:   [address] [place] [address] [place] [address] [phone]       Follow up with Alleen Borne, MD on 09/28/2011.  (Have a chest x-ray at 12:30, then see MD at 1:30)    Contact information:   [address] [place] [address] [phone]           COLLINS,GINA H 09/06/2011, 10:10 AM

## 2011-09-06 NOTE — Progress Notes (Signed)
CARDIAC REHAB PHASE I   PRE:  Rate/Rhythm: 111ST  BP:  Supine:   Sitting: 149/80  Standing:    SaO2: 90% 1.5L  MODE:  Ambulation: 550 ft   POST:  Rate/Rhythem: 118ST  BP:  Supine:   Sitting: 164/84  Standing:    SaO2: 90-91%2L 0850-0923 Pt walked 550 ft on 2L with rolling walker and asst x 1. Tolerated well. Encouraged OOB to recliner and IS to help get off oxygen. To recliner after walk. Pt knows he is to walk twice more with staff. Discussed CRP 2 and permission given to refer to [place] Phase 2.  Duanne Limerick

## 2011-09-06 NOTE — Anesthesia Postprocedure Evaluation (Signed)
  Anesthesia Post-op Note  Patient: [name]  Procedure(s) Performed: Procedure(s) (LRB): AORTIC VALVE REPLACEMENT (AVR)/CORONARY ARTERY BYPASS GRAFTING (CABG) (N/A)  Patient Location: Nursing Unit  Anesthesia Type: General  Level of Consciousness: awake, alert , oriented and patient cooperative  Airway and Oxygen Therapy: Patient Spontanous Breathing  Post-op Pain: none  Post-op Assessment: Post-op Vital signs reviewed, Patient's Cardiovascular Status Stable, Respiratory Function Stable, Patent Airway, No signs of Nausea or vomiting, Adequate PO intake and Pain level controlled  Post-op Vital Signs: Reviewed and stable  Complications: No apparent anesthesia complications

## 2011-09-07 ENCOUNTER — Encounter (HOSPITAL_COMMUNITY): Payer: Self-pay | Admitting: *Deleted

## 2011-09-07 LAB — BASIC METABOLIC PANEL
BUN: 19 mg/dL (ref 6–23)
CO2: 30 mEq/L (ref 19–32)
Calcium: 8.6 mg/dL (ref 8.4–10.5)
Chloride: 95 mEq/L — ABNORMAL LOW (ref 96–112)
Creatinine, Ser: 1.12 mg/dL (ref 0.50–1.35)
GFR calc Af Amer: 74 mL/min — ABNORMAL LOW (ref >90)

## 2011-09-07 MED ORDER — POTASSIUM CHLORIDE CRYS ER 20 MEQ PO TBCR
40.0000 meq | EXTENDED_RELEASE_TABLET | Freq: Once | ORAL | Status: AC
  Administered 2011-09-07: 40 meq via ORAL
  Filled 2011-09-07: qty 2

## 2011-09-07 NOTE — Progress Notes (Signed)
CARDIAC REHAB PHASE I   PRE:  Rate/Rhythm: 86 SR    BP: sitting 124/77    SaO2: 92 RA  MODE:  Ambulation: 550 ft   POST:  Rate/Rhythm: 117    BP: sitting 148/77    SaO2: 85 RA, 92 Ra  Steady without RW. SaO2 good at rest but walking periodically desat to 85-86 RA. Up with deep breathing to 90-91 RA after a few minutes. Denied significant SOB. Ed completed with family present.  [id number]  Harriet Masson CES, ACSM

## 2011-09-07 NOTE — Progress Notes (Signed)
   CARE MANAGEMENT NOTE 09/07/2011  Patient:  [name], [name]   Account Number:  [id number]  Date Initiated:  09/02/2011  Documentation initiated by:  [facility]  Subjective/Objective Assessment:   Post op AVR and CABG x1 ON 4/25  Has Spouse.     Action/Plan:   PTA, PT INDEPENDENT, LIVES WITH SPOUSE. WIFE TO PROVIDE CARE AT DC.  WILL FOLLOW FOR HOME NEEDS AS PT PROGRESSES.   Anticipated DC Date:  09/07/2011   Anticipated DC Plan:  HOME W HOME HEALTH SERVICES      DC Planning Services  CM consult      [facility] Choice  HOME HEALTH   Choice offered to / List presented to:  [name]   DME arranged  [facility] STOOL  [facility] - ROLLING      DME agency  [facility]     [facility] arranged  HH-1 RN      [facility] agency  [facility]   Status of service:  In process, will continue to follow Medicare Important Message given?   (If response is "NO", the following Medicare IM given date fields will be blank) Date Medicare IM given:   Date Additional Medicare IM given:    Discharge Disposition:  HOME W HOME HEALTH SERVICES  Per UR Regulation:  Reviewed for med. necessity/level of care/duration of stay  If discussed at Long Length of Stay Meetings, dates discussed:    Comments:  09/07/11 Elna Radovich,RN,BSN 1045 MET WITH PT AND FAMILY TO DISCUSS DC PLANS. PT TO DC HOME WITH WIFE...THEY ARE INTERESTED IN [facility] FOR RESTORATIVE CARE.  WILL ARRANGE HOME HEALTH FOLLOW UP.  PT ALSO NEEDS RW FOR HOME.  REFERRAL TO [facility], PER CHOICE, FOR HOME HEALTH AND DME NEEDS.  DC ON HOLD UNTIL AM, AS O2 SATS RUNNING AROUND MID 80S.  HOPEFUL THAT PT WILL NOT NEED HOME OXYGEN SET UP.  WILL FOLLOW. Phone #[phone]

## 2011-09-07 NOTE — Progress Notes (Signed)
Nurse just informed me that patient desat's to 85% (on room air) with ambulation. As a result, we will hold discharge today. Assess in am.

## 2011-09-07 NOTE — Progress Notes (Signed)
Pt ambulated in hallway with [employer] 150 ft on RA at 97%. No complaints of sob or weakness. Will continue to monitor.   Donyea Beverlin,Rn,BSN

## 2011-09-07 NOTE — Progress Notes (Signed)
EPWS and sutures removed per MD order, Per protocol. Pt tolerated procedure. Steri strips placed at each site. Pt instructed to stay in bed one hour. V/S scheduled q 15 x 4. Pt's family at bedside. Will continue to monitor.   Leonie Green [phone]

## 2011-09-07 NOTE — Progress Notes (Addendum)
5 Days Post-Op Procedure(s) (LRB): AORTIC VALVE REPLACEMENT (AVR)/CORONARY ARTERY BYPASS GRAFTING (CABG) (N/A)  Subjective: Patient without complaints this am;about to eat breakfast.  Objective: Vital signs in last 24 hours: Patient Vitals for the past 24 hrs:  BP Temp Temp src Pulse Resp SpO2 Height Weight  09/07/11 0626 127/80 mmHg 97.5 F (36.4 C) Oral 88  19  95 % - -  09/07/11 0500 - - - - - - - 205 lb 7.5 oz (93.2 kg)  09/06/11 2300 - - - - - - 5\' 10"  (1.778 m) 207 lb 14.3 oz (94.3 kg)  09/06/11 2113 120/72 mmHg 98.4 F (36.9 C) Oral 92  18  96 % - -  09/06/11 2024 130/72 mmHg 98.8 F (37.1 C) Oral 90  18  93 % - -  09/06/11 1336 113/81 mmHg 97.9 F (36.6 C) Oral 80  18  96 % - -   Pre op weight  91.3 kg Current Weight  09/07/11 205 lb 7.5 oz (93.2 kg)      Intake/Output from previous day: 04/29 0701 - 04/30 0700 In: 243 [P.O.:240; I.V.:3] Out: -    Physical Exam:  Cardiovascular: RRR; no murmurs, gallops, or rubs. Pulmonary: Slightly decreased at bases bilaterally; no rales, wheezes, or rhonchi. Abdomen: Soft, non tender, bowel sounds present. Extremities: Mild bilateral lower extremity edema. Wounds: Clean and dry.  No erythema or signs of infection.  Lab Results: CBC:  Basename 09/05/11 0544  WBC 10.3  HGB 8.6*  HCT 25.8*  PLT 159   BMET:   Basename 09/07/11 0510 09/05/11 0544  NA 133* 132*  K 3.6 4.3  CL 95* 98  CO2 30 27  GLUCOSE 99 111*  BUN 19 23  CREATININE 1.12 1.37*  CALCIUM 8.6 8.2*    PT/INR: No results found for this basename: LABPROT,INR in the last 72 hours ABG:  INR: Will add last result for INR, ABG once components are confirmed Will add last 4 CBG results once components are confirmed  Assessment/Plan:  1. CV - SR.Continue Lopressor 25 bid. 2.  Pulmonary - Encourage incentive spirometer.Weaned off O2. 3. Volume Overload - Continue with diuresis.On Lasix 80 daily. 4.  Acute blood loss anemia - Last H/H 8.6/25.8. Start  Ferrous. 5.Remove EPW. 6.Supplement Potassium 7.Creatinine improved and now normal. 8.Likely discharge later today.   ZIMMERMAN,DONIELLE MPA-C 09/07/2011 desats to high 80's % today, plan d/c tomorrow of sats ok No leg edema or evidence of dvt I have seen and examined Sallee Lange and agree with the above assessment  and plan. Plan d/c tomorrow   Delight Ovens MD Beeper [phone] Office [phone] 09/07/2011 11:02 AM

## 2011-09-08 MED ORDER — POTASSIUM CHLORIDE CRYS ER 20 MEQ PO TBCR: 20.0000 meq | EXTENDED_RELEASE_TABLET | Freq: Every day | ORAL | Status: DC

## 2011-09-08 MED ORDER — POTASSIUM CHLORIDE CRYS ER 20 MEQ PO TBCR
20.0000 meq | EXTENDED_RELEASE_TABLET | Freq: Once | ORAL | Status: AC
  Administered 2011-09-08: 20 meq via ORAL
  Filled 2011-09-08: qty 1

## 2011-09-08 MED ORDER — FUROSEMIDE 40 MG PO TABS
40.0000 mg | ORAL_TABLET | Freq: Once | ORAL | Status: AC
  Administered 2011-09-08: 40 mg via ORAL
  Filled 2011-09-08: qty 1

## 2011-09-08 MED ORDER — FUROSEMIDE 40 MG PO TABS: 40.0000 mg | ORAL_TABLET | Freq: Every day | ORAL | Status: DC

## 2011-09-08 NOTE — Progress Notes (Signed)
Nurse tech informed nurse prior to walking the patient, that the patient's O2 sats were 98% without O2 in the room. Nurse tech walked the patient 150 ft down the hall and Sats was 83% without oxygen. The nurse tech applied 1 liter of O2 to the patient, patient O2 Sats rose to 96%. Nurse tech walked the patient and the Sats remained @ 93% on 1 liter. Harmon Pier

## 2011-09-08 NOTE — Progress Notes (Signed)
Pt up walking in hall. Joined him and monitored SaO2. SaO2 remained 90-94 RA for 350 ft. Tolerated well. SaO2 94 RA after walk. HR 104 and BP 127/74. All questions answered. Ready for D/C.  [id number]

## 2011-09-08 NOTE — Progress Notes (Signed)
Pt discharge instructions and patient education complete. IV site d/c. Site WNL. Shower chair and walker delivered to room. Pt had no further questions. D/C home with family. Chestine Spore, Bary Richard

## 2011-09-08 NOTE — Progress Notes (Addendum)
6 Days Post-Op Procedure(s) (LRB): AORTIC VALVE REPLACEMENT (AVR)/CORONARY ARTERY BYPASS GRAFTING (CABG) (N/A)  Subjective: Patient without complaints this am;about to eat breakfast.  Objective: Vital signs in last 24 hours: Patient Vitals for the past 24 hrs:  BP Temp Temp src Pulse Resp SpO2 Weight  09/08/11 0451 133/83 mmHg - Oral 88  18  92 % 204 lb 2.3 oz (92.6 kg)  09/07/11 2126 132/74 mmHg - - 92  - - -  09/07/11 2115 - - - - - 81 % -  09/07/11 2048 - 97.8 F (36.6 C) Oral - 18  93 % -  09/07/11 2000 125/74 mmHg 98.2 F (36.8 C) Oral 86  18  93 % -  09/07/11 1300 129/83 mmHg 98.4 F (36.9 C) Oral 87  18  96 % -  09/07/11 1252 127/74 mmHg 97.6 F (36.4 C) Oral 86  18  94 % -  09/07/11 1020 129/83 mmHg 98.4 F (36.9 C) Oral 88  18  91 % -  09/07/11 1005 120/70 mmHg 98.4 F (36.9 C) Oral 90  18  91 % -  09/07/11 0950 121/74 mmHg 98.5 F (36.9 C) Oral 85  18  90 % -  09/07/11 0927 129/83 mmHg 98.4 F (36.9 C) Oral 86  18  96 % -   Pre op weight  91.3 kg Current Weight  09/08/11 204 lb 2.3 oz (92.6 kg)      Intake/Output from previous day: 04/30 0701 - 05/01 0700 In: 723 [P.O.:720; I.V.:3] Out: -    Physical Exam:  Cardiovascular: RRR; no murmurs, gallops, or rubs. Pulmonary: Slightly decreased at bases bilaterally; no rales, wheezes, or rhonchi. Abdomen: Soft, non tender, bowel sounds present. Extremities: Mild bilateral lower extremity edema. Wounds: Clean and dry.  No erythema or signs of infection.  Lab Results: CBC: No results found for this basename: WBC:2,HGB:2,HCT:2,PLT:2 in the last 72 hours BMET:   [place] 09/07/11 0510  NA 133*  K 3.6  CL 95*  CO2 30  GLUCOSE 99  BUN 19  CREATININE 1.12  CALCIUM 8.6    PT/INR: No results found for this basename: LABPROT,INR in the last 72 hours ABG:  INR: Will add last result for INR, ABG once components are confirmed Will add last 4 CBG results once components are confirmed  Assessment/Plan:  1.  CV - SR.Continue Lopressor 25 bid. 2.  Pulmonary - Encourage incentive spirometer.According to night nurse noets, he still desats  (on room air) with ambulation and requires 1L via [place].His O2 sat at rest is in the high 90's. 3. Volume Overload - Continue with diuresis. 4.  Acute blood loss anemia - Last H/H 8.6/25.8. Continue Ferrous. 5.Creatinine improved and now normal.  Update as of 8:37 am: 6.Patient just finished walking with nurse. His O2 sat on room air is 98%. Per discussion with Dr. Tyrone Sage, will discharge home.   Cadee Agro MPA-C 09/08/2011

## 2011-09-08 NOTE — Progress Notes (Signed)
Nurse tech walked the patient. Nurse tech informed the nurse that the patient's O2 sat mid walk was in the high 80's (88). The patient's Sats in the lower 90's when sitting. [name]

## 2011-09-08 NOTE — Progress Notes (Signed)
Pt ambulated 300 feet with walker on room air. Pt oxygen saturations remained 96-99% on room air throughout ambulation. Pt tolerated well. Dion Saucier

## 2011-09-09 DIAGNOSIS — Z7982 Long term (current) use of aspirin: Secondary | ICD-10-CM | POA: Diagnosis not present

## 2011-09-09 DIAGNOSIS — Z952 Presence of prosthetic heart valve: Secondary | ICD-10-CM | POA: Diagnosis not present

## 2011-09-09 DIAGNOSIS — I10 Essential (primary) hypertension: Secondary | ICD-10-CM | POA: Diagnosis not present

## 2011-09-09 DIAGNOSIS — I2510 Atherosclerotic heart disease of native coronary artery without angina pectoris: Secondary | ICD-10-CM | POA: Diagnosis not present

## 2011-09-09 DIAGNOSIS — Z48812 Encounter for surgical aftercare following surgery on the circulatory system: Secondary | ICD-10-CM | POA: Diagnosis not present

## 2011-09-09 NOTE — Care Management Note (Signed)
    Page 1 of 2   09/09/2011     3:30:40 PM   CARE MANAGEMENT NOTE 09/09/2011  Patient:  [name], [name]   Account Number:  [id number]  Date Initiated:  09/02/2011  Documentation initiated by:  [facility]  Subjective/Objective Assessment:   Post op AVR and CABG x1 ON 4/25  Has Spouse.     Action/Plan:   PTA, PT INDEPENDENT, LIVES WITH SPOUSE. WIFE TO PROVIDE CARE AT DC.  WILL FOLLOW FOR HOME NEEDS AS PT PROGRESSES.   Anticipated DC Date:  09/07/2011   Anticipated DC Plan:  HOME W HOME HEALTH SERVICES      DC Planning Services  CM consult      [facility] Choice  HOME HEALTH   Choice offered to / List presented to:  [name]   DME arranged  [facility]OOL  [facility] - ROLLING      DME agency  [facility]     [facility] arranged  HH-1 RN      [facility] agency  [facility]   Status of service:  Completed, signed off Medicare Important Message given?   (If response is "NO", the following Medicare IM given date fields will be blank) Date Medicare IM given:   Date Additional Medicare IM given:    Discharge Disposition:  HOME W HOME HEALTH SERVICES  Per UR Regulation:  Reviewed for med. necessity/level of care/duration of stay  If discussed at Long Length of Stay Meetings, dates discussed:    Comments:  09/08/11 Michael Kabat,RN,BSN 1000 PT FOR DC HOME TODAY.  SATS IN THE MID 90S THIS AM WITH AMBULATION; WILL NOT NEED HOME O2.  DME TO BE DELIVERED TO ROOM PRIOR TO DC. [facility] AWARE OF DC DATE.  09/07/11 Michael Kunsman,RN,BSN 1045 MET WITH PT AND FAMILY TO DISCUSS DC PLANS. PT TO DC HOME WITH WIFE...THEY ARE INTERESTED IN [name] C[facility].  WILL ARRANGE HOME HEALTH FOLLOW UP.  PT ALSO NEEDS RW FOR HOME.  REFERRAL TO [facility], PER CHOICE, FOR HOME HEALTH AND DME NEEDS.  DC ON HOLD UNTIL AM, AS O2 SATS RUNNING AROUND MID 80S.  HOPEFUL THAT PT WILL NOT NEED HOME OXYGEN SET UP.  WILL FOLLOW.

## 2011-09-11 ENCOUNTER — Emergency Department (HOSPITAL_COMMUNITY): Payer: Medicare Other

## 2011-09-11 ENCOUNTER — Encounter (HOSPITAL_COMMUNITY): Payer: Self-pay | Admitting: *Deleted

## 2011-09-11 ENCOUNTER — Inpatient Hospital Stay (HOSPITAL_COMMUNITY)
Admission: EM | Admit: 2011-09-11 | Discharge: 2011-09-20 | DRG: 193 | Disposition: A | Discharge status: Home Health | Payer: Medicare Other | Attending: Family Medicine | Admitting: Family Medicine

## 2011-09-11 DIAGNOSIS — Z951 Presence of aortocoronary bypass graft: Secondary | ICD-10-CM

## 2011-09-11 DIAGNOSIS — J9600 Acute respiratory failure, unspecified whether with hypoxia or hypercapnia: Secondary | ICD-10-CM | POA: Diagnosis not present

## 2011-09-11 DIAGNOSIS — D72829 Elevated white blood cell count, unspecified: Secondary | ICD-10-CM | POA: Diagnosis present

## 2011-09-11 DIAGNOSIS — R0602 Shortness of breath: Secondary | ICD-10-CM | POA: Diagnosis not present

## 2011-09-11 DIAGNOSIS — E871 Hypo-osmolality and hyponatremia: Secondary | ICD-10-CM | POA: Diagnosis present

## 2011-09-11 DIAGNOSIS — I2510 Atherosclerotic heart disease of native coronary artery without angina pectoris: Secondary | ICD-10-CM | POA: Diagnosis not present

## 2011-09-11 DIAGNOSIS — Z952 Presence of prosthetic heart valve: Secondary | ICD-10-CM | POA: Diagnosis not present

## 2011-09-11 DIAGNOSIS — I350 Nonrheumatic aortic (valve) stenosis: Secondary | ICD-10-CM

## 2011-09-11 DIAGNOSIS — I2699 Other pulmonary embolism without acute cor pulmonale: Secondary | ICD-10-CM | POA: Diagnosis not present

## 2011-09-11 DIAGNOSIS — R319 Hematuria, unspecified: Secondary | ICD-10-CM | POA: Diagnosis not present

## 2011-09-11 DIAGNOSIS — R0902 Hypoxemia: Secondary | ICD-10-CM | POA: Diagnosis not present

## 2011-09-11 DIAGNOSIS — I10 Essential (primary) hypertension: Secondary | ICD-10-CM | POA: Diagnosis present

## 2011-09-11 DIAGNOSIS — Z7982 Long term (current) use of aspirin: Secondary | ICD-10-CM | POA: Diagnosis not present

## 2011-09-11 DIAGNOSIS — J90 Pleural effusion, not elsewhere classified: Secondary | ICD-10-CM | POA: Diagnosis present

## 2011-09-11 DIAGNOSIS — R5383 Other fatigue: Secondary | ICD-10-CM | POA: Diagnosis not present

## 2011-09-11 DIAGNOSIS — Q231 Congenital insufficiency of aortic valve: Secondary | ICD-10-CM

## 2011-09-11 DIAGNOSIS — R509 Fever, unspecified: Secondary | ICD-10-CM | POA: Diagnosis not present

## 2011-09-11 DIAGNOSIS — D649 Anemia, unspecified: Secondary | ICD-10-CM | POA: Diagnosis present

## 2011-09-11 DIAGNOSIS — I4891 Unspecified atrial fibrillation: Secondary | ICD-10-CM | POA: Diagnosis not present

## 2011-09-11 DIAGNOSIS — B9689 Other specified bacterial agents as the cause of diseases classified elsewhere: Secondary | ICD-10-CM | POA: Diagnosis not present

## 2011-09-11 DIAGNOSIS — Z48812 Encounter for surgical aftercare following surgery on the circulatory system: Secondary | ICD-10-CM | POA: Diagnosis not present

## 2011-09-11 DIAGNOSIS — I351 Nonrheumatic aortic (valve) insufficiency: Secondary | ICD-10-CM

## 2011-09-11 DIAGNOSIS — E785 Hyperlipidemia, unspecified: Secondary | ICD-10-CM | POA: Diagnosis present

## 2011-09-11 DIAGNOSIS — J189 Pneumonia, unspecified organism: Secondary | ICD-10-CM

## 2011-09-11 DIAGNOSIS — R5381 Other malaise: Secondary | ICD-10-CM | POA: Diagnosis not present

## 2011-09-11 DIAGNOSIS — R918 Other nonspecific abnormal finding of lung field: Secondary | ICD-10-CM | POA: Diagnosis not present

## 2011-09-11 DIAGNOSIS — R0600 Dyspnea, unspecified: Secondary | ICD-10-CM | POA: Diagnosis present

## 2011-09-11 DIAGNOSIS — N400 Enlarged prostate without lower urinary tract symptoms: Secondary | ICD-10-CM | POA: Diagnosis present

## 2011-09-11 DIAGNOSIS — R600 Localized edema: Secondary | ICD-10-CM | POA: Diagnosis present

## 2011-09-11 LAB — CBC
HCT: 31.1 % — ABNORMAL LOW (ref 39.0–52.0)
MCHC: 34.7 g/dL (ref 30.0–36.0)
MCV: 80.8 fL (ref 78.0–100.0)
Platelets: 191 10*3/uL (ref 150–400)
RDW: 13.6 % (ref 11.5–15.5)
WBC: 16 10*3/uL — ABNORMAL HIGH (ref 4.0–10.5)

## 2011-09-11 LAB — URINALYSIS, ROUTINE W REFLEX MICROSCOPIC
Bilirubin Urine: NEGATIVE
Leukocytes, UA: NEGATIVE
Nitrite: NEGATIVE
Specific Gravity, Urine: 1.015 (ref 1.005–1.030)
Urobilinogen, UA: 2 mg/dL — ABNORMAL HIGH (ref 0.0–1.0)
pH: 6.5 (ref 5.0–8.0)

## 2011-09-11 LAB — DIFFERENTIAL
Basophils Relative: 0 % (ref 0–1)
Eosinophils Absolute: 0.1 10*3/uL (ref 0.0–0.7)
Eosinophils Relative: 1 % (ref 0–5)
Lymphs Abs: 1.3 10*3/uL (ref 0.7–4.0)
Monocytes Relative: 13 % — ABNORMAL HIGH (ref 3–12)

## 2011-09-11 LAB — POCT I-STAT, CHEM 8
Chloride: 95 mEq/L — ABNORMAL LOW (ref 96–112)
Glucose, Bld: 129 mg/dL — ABNORMAL HIGH (ref 70–99)
HCT: 35 % — ABNORMAL LOW (ref 39.0–52.0)
Hemoglobin: 11.9 g/dL — ABNORMAL LOW (ref 13.0–17.0)
Potassium: 4.2 mEq/L (ref 3.5–5.1)
Sodium: 129 mEq/L — ABNORMAL LOW (ref 135–145)

## 2011-09-11 MED ORDER — MOXIFLOXACIN HCL 400 MG PO TABS: 400.0000 mg | ORAL_TABLET | Freq: Every day | ORAL | Status: DC

## 2011-09-11 MED ORDER — SODIUM CHLORIDE 0.9 % IV SOLN
INTRAVENOUS | Status: DC
  Administered 2011-09-11: 20:00:00 via INTRAVENOUS

## 2011-09-11 MED ORDER — MOXIFLOXACIN HCL IN NACL 400 MG/250ML IV SOLN
400.0000 mg | Freq: Once | INTRAVENOUS | Status: AC
Start: 2011-09-11 — End: 2011-09-11
  Administered 2011-09-11: 400 mg via INTRAVENOUS
  Filled 2011-09-11: qty 250

## 2011-09-11 NOTE — ED Notes (Signed)
Patient presents via EMS  Had aortic valve replacement on 4/27 and was discharged on Thursday to home.  Today presents with c/o weakness and shaking.  He was unable to eat because he was shaking so much.

## 2011-09-11 NOTE — ED Notes (Signed)
Patient states that he was more unsteady on his feet today and had tremors more on the right then the left.  Lungs clear bilaterally
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
## 2011-09-12 ENCOUNTER — Inpatient Hospital Stay (HOSPITAL_COMMUNITY): Payer: Medicare Other

## 2011-09-12 ENCOUNTER — Encounter (HOSPITAL_COMMUNITY): Payer: Self-pay | Admitting: Internal Medicine

## 2011-09-12 DIAGNOSIS — E871 Hypo-osmolality and hyponatremia: Secondary | ICD-10-CM | POA: Diagnosis not present

## 2011-09-12 DIAGNOSIS — B9689 Other specified bacterial agents as the cause of diseases classified elsewhere: Secondary | ICD-10-CM | POA: Diagnosis not present

## 2011-09-12 DIAGNOSIS — D72829 Elevated white blood cell count, unspecified: Secondary | ICD-10-CM | POA: Diagnosis present

## 2011-09-12 DIAGNOSIS — Z952 Presence of prosthetic heart valve: Secondary | ICD-10-CM | POA: Diagnosis not present

## 2011-09-12 DIAGNOSIS — E785 Hyperlipidemia, unspecified: Secondary | ICD-10-CM | POA: Diagnosis not present

## 2011-09-12 DIAGNOSIS — J189 Pneumonia, unspecified organism: Secondary | ICD-10-CM | POA: Diagnosis present

## 2011-09-12 DIAGNOSIS — J9600 Acute respiratory failure, unspecified whether with hypoxia or hypercapnia: Secondary | ICD-10-CM | POA: Diagnosis not present

## 2011-09-12 DIAGNOSIS — R0602 Shortness of breath: Secondary | ICD-10-CM | POA: Diagnosis not present

## 2011-09-12 DIAGNOSIS — I319 Disease of pericardium, unspecified: Secondary | ICD-10-CM | POA: Diagnosis not present

## 2011-09-12 DIAGNOSIS — J90 Pleural effusion, not elsewhere classified: Secondary | ICD-10-CM | POA: Diagnosis present

## 2011-09-12 DIAGNOSIS — R509 Fever, unspecified: Secondary | ICD-10-CM | POA: Diagnosis not present

## 2011-09-12 DIAGNOSIS — R0902 Hypoxemia: Secondary | ICD-10-CM | POA: Diagnosis not present

## 2011-09-12 DIAGNOSIS — N400 Enlarged prostate without lower urinary tract symptoms: Secondary | ICD-10-CM | POA: Diagnosis present

## 2011-09-12 DIAGNOSIS — R0989 Other specified symptoms and signs involving the circulatory and respiratory systems: Secondary | ICD-10-CM | POA: Diagnosis not present

## 2011-09-12 DIAGNOSIS — R319 Hematuria, unspecified: Secondary | ICD-10-CM | POA: Diagnosis not present

## 2011-09-12 DIAGNOSIS — D649 Anemia, unspecified: Secondary | ICD-10-CM | POA: Diagnosis present

## 2011-09-12 DIAGNOSIS — J9819 Other pulmonary collapse: Secondary | ICD-10-CM | POA: Diagnosis not present

## 2011-09-12 DIAGNOSIS — R918 Other nonspecific abnormal finding of lung field: Secondary | ICD-10-CM | POA: Diagnosis not present

## 2011-09-12 DIAGNOSIS — I2510 Atherosclerotic heart disease of native coronary artery without angina pectoris: Secondary | ICD-10-CM | POA: Diagnosis present

## 2011-09-12 DIAGNOSIS — Z951 Presence of aortocoronary bypass graft: Secondary | ICD-10-CM | POA: Diagnosis not present

## 2011-09-12 DIAGNOSIS — I2699 Other pulmonary embolism without acute cor pulmonale: Secondary | ICD-10-CM | POA: Diagnosis not present

## 2011-09-12 DIAGNOSIS — Z86711 Personal history of pulmonary embolism: Secondary | ICD-10-CM | POA: Diagnosis not present

## 2011-09-12 DIAGNOSIS — I10 Essential (primary) hypertension: Secondary | ICD-10-CM | POA: Diagnosis not present

## 2011-09-12 DIAGNOSIS — I4891 Unspecified atrial fibrillation: Secondary | ICD-10-CM | POA: Diagnosis not present

## 2011-09-12 LAB — CBC
HCT: 29.3 % — ABNORMAL LOW (ref 39.0–52.0)
Hemoglobin: 10.1 g/dL — ABNORMAL LOW (ref 13.0–17.0)
MCH: 27.9 pg (ref 26.0–34.0)
MCHC: 34.5 g/dL (ref 30.0–36.0)
MCV: 80.9 fL (ref 78.0–100.0)
Platelets: 208 10*3/uL (ref 150–400)
RBC: 3.62 MIL/uL — ABNORMAL LOW (ref 4.22–5.81)
RDW: 13.6 % (ref 11.5–15.5)
WBC: 20.5 10*3/uL — ABNORMAL HIGH (ref 4.0–10.5)

## 2011-09-12 LAB — DIFFERENTIAL
Basophils Absolute: 0 10*3/uL (ref 0.0–0.1)
Eosinophils Absolute: 0 10*3/uL (ref 0.0–0.7)
Lymphocytes Relative: 5 % — ABNORMAL LOW (ref 12–46)
Monocytes Relative: 10 % (ref 3–12)
Neutro Abs: 17.4 10*3/uL — ABNORMAL HIGH (ref 1.7–7.7)
Neutrophils Relative %: 85 % — ABNORMAL HIGH (ref 43–77)

## 2011-09-12 LAB — LEGIONELLA ANTIGEN, URINE: Legionella Antigen, Urine: NEGATIVE

## 2011-09-12 LAB — COMPREHENSIVE METABOLIC PANEL
ALT: 26 U/L (ref 0–53)
AST: 35 U/L (ref 0–37)
Albumin: 2.8 g/dL — ABNORMAL LOW (ref 3.5–5.2)
Alkaline Phosphatase: 70 U/L (ref 39–117)
BUN: 14 mg/dL (ref 6–23)
CO2: 22 mEq/L (ref 19–32)
Calcium: 8.7 mg/dL (ref 8.4–10.5)
Chloride: 94 mEq/L — ABNORMAL LOW (ref 96–112)
Creatinine, Ser: 1.1 mg/dL (ref 0.50–1.35)
GFR calc Af Amer: 75 mL/min — ABNORMAL LOW (ref >90)
GFR calc non Af Amer: 65 mL/min — ABNORMAL LOW (ref >90)
Glucose, Bld: 130 mg/dL — ABNORMAL HIGH (ref 70–99)
Potassium: 4.1 mEq/L (ref 3.5–5.1)
Sodium: 127 mEq/L — ABNORMAL LOW (ref 135–145)
Total Bilirubin: 1.2 mg/dL (ref 0.3–1.2)
Total Protein: 6.9 g/dL (ref 6.0–8.3)

## 2011-09-12 LAB — EXPECTORATED SPUTUM ASSESSMENT W GRAM STAIN, RFLX TO RESP C: Special Requests: NORMAL

## 2011-09-12 LAB — BLOOD GAS, ARTERIAL
Acid-base deficit: 0.3 mmol/L (ref 0.0–2.0)
Bicarbonate: 23.1 mEq/L (ref 20.0–24.0)
Drawn by: 318431
O2 Content: 2 L/min
O2 Saturation: 96.1 %
Patient temperature: 98.6
TCO2: 24.1 mmol/L (ref 0–100)
pCO2 arterial: 32.5 mmHg — ABNORMAL LOW (ref 35.0–45.0)
pH, Arterial: 7.465 — ABNORMAL HIGH (ref 7.350–7.450)
pO2, Arterial: 72.4 mmHg — ABNORMAL LOW (ref 80.0–100.0)

## 2011-09-12 LAB — STREP PNEUMONIAE URINARY ANTIGEN: Strep Pneumo Urinary Antigen: NEGATIVE

## 2011-09-12 MED ORDER — POTASSIUM CHLORIDE CRYS ER 20 MEQ PO TBCR
20.0000 meq | EXTENDED_RELEASE_TABLET | Freq: Every day | ORAL | Status: DC
  Administered 2011-09-12 – 2011-09-20 (×9): 20 meq via ORAL
  Filled 2011-09-12 (×13): qty 1

## 2011-09-12 MED ORDER — METOPROLOL TARTRATE 25 MG PO TABS
25.0000 mg | ORAL_TABLET | Freq: Two times a day (BID) | ORAL | Status: DC
  Administered 2011-09-12 – 2011-09-14 (×6): 25 mg via ORAL
  Filled 2011-09-12 (×9): qty 1

## 2011-09-12 MED ORDER — POLYVINYL ALCOHOL 1.4 % OP SOLN
1.0000 [drp] | Freq: Two times a day (BID) | OPHTHALMIC | Status: DC | PRN
  Administered 2011-09-13: 1 [drp] via OPHTHALMIC
  Filled 2011-09-12: qty 15

## 2011-09-12 MED ORDER — SODIUM CHLORIDE 0.9 % IV SOLN
INTRAVENOUS | Status: DC
  Administered 2011-09-12 – 2011-09-15 (×4): via INTRAVENOUS

## 2011-09-12 MED ORDER — FENOFIBRATE 54 MG PO TABS
54.0000 mg | ORAL_TABLET | Freq: Every day | ORAL | Status: DC
  Administered 2011-09-12 – 2011-09-20 (×9): 54 mg via ORAL
  Filled 2011-09-12 (×10): qty 1

## 2011-09-12 MED ORDER — ONE-DAILY MULTI VITAMINS PO TABS: 1.0000 | ORAL_TABLET | Freq: Every day | ORAL | Status: DC

## 2011-09-12 MED ORDER — SODIUM CHLORIDE 0.9 % IV SOLN: 250.0000 mL | INTRAVENOUS | Status: DC | PRN

## 2011-09-12 MED ORDER — SODIUM CHLORIDE 0.9 % IJ SOLN: 3.0000 mL | INTRAMUSCULAR | Status: DC | PRN

## 2011-09-12 MED ORDER — DEXTROSE 5 % IV SOLN
1.0000 g | Freq: Three times a day (TID) | INTRAVENOUS | Status: DC
  Administered 2011-09-12 – 2011-09-17 (×16): 1 g via INTRAVENOUS
  Filled 2011-09-12 (×19): qty 1

## 2011-09-12 MED ORDER — VITAMIN D3 25 MCG (1000 UNIT) PO TABS
2000.0000 [IU] | ORAL_TABLET | Freq: Every day | ORAL | Status: DC
  Administered 2011-09-12 – 2011-09-20 (×9): 2000 [IU] via ORAL
  Filled 2011-09-12 (×10): qty 2

## 2011-09-12 MED ORDER — VITAMIN C 500 MG PO TABS
500.0000 mg | ORAL_TABLET | Freq: Every day | ORAL | Status: DC
  Administered 2011-09-12 – 2011-09-20 (×9): 500 mg via ORAL
  Filled 2011-09-12 (×10): qty 1

## 2011-09-12 MED ORDER — TRAMADOL HCL 50 MG PO TABS
50.0000 mg | ORAL_TABLET | Freq: Four times a day (QID) | ORAL | Status: DC | PRN
  Administered 2011-09-12 – 2011-09-20 (×20): 50 mg via ORAL
  Filled 2011-09-12 (×20): qty 1

## 2011-09-12 MED ORDER — SODIUM CHLORIDE 0.9 % IJ SOLN
3.0000 mL | Freq: Two times a day (BID) | INTRAMUSCULAR | Status: DC
  Administered 2011-09-14 – 2011-09-20 (×10): 3 mL via INTRAVENOUS

## 2011-09-12 MED ORDER — CARBOXYMETHYLCELLULOSE SODIUM 1 % OP SOLN: 1.0000 [drp] | Freq: Two times a day (BID) | OPHTHALMIC | Status: DC | PRN

## 2011-09-12 MED ORDER — TAMSULOSIN HCL 0.4 MG PO CAPS
0.4000 mg | ORAL_CAPSULE | Freq: Every day | ORAL | Status: DC
  Administered 2011-09-12 – 2011-09-20 (×9): 0.4 mg via ORAL
  Filled 2011-09-12 (×10): qty 1

## 2011-09-12 MED ORDER — VANCOMYCIN HCL 1000 MG IV SOLR
750.0000 mg | Freq: Two times a day (BID) | INTRAVENOUS | Status: DC
  Administered 2011-09-12 – 2011-09-15 (×7): 750 mg via INTRAVENOUS
  Filled 2011-09-12 (×8): qty 750

## 2011-09-12 MED ORDER — METOPROLOL TARTRATE 25 MG PO TABS
25.0000 mg | ORAL_TABLET | Freq: Once | ORAL | Status: AC
  Administered 2011-09-12: 25 mg via ORAL
  Filled 2011-09-12: qty 1

## 2011-09-12 MED ORDER — ASPIRIN 325 MG PO TABS
325.0000 mg | ORAL_TABLET | Freq: Every day | ORAL | Status: DC
  Administered 2011-09-12 – 2011-09-14 (×3): 325 mg via ORAL
  Filled 2011-09-12 (×3): qty 1

## 2011-09-12 MED ORDER — ADULT MULTIVITAMIN W/MINERALS CH
1.0000 | ORAL_TABLET | Freq: Every day | ORAL | Status: DC
  Administered 2011-09-12 – 2011-09-20 (×9): 1 via ORAL
  Filled 2011-09-12 (×10): qty 1

## 2011-09-12 MED ORDER — POLYETHYLENE GLYCOL 3350 17 G PO PACK
17.0000 g | PACK | Freq: Every day | ORAL | Status: DC
  Administered 2011-09-12 – 2011-09-20 (×8): 17 g via ORAL
  Filled 2011-09-12 (×10): qty 1

## 2011-09-12 MED ORDER — FUROSEMIDE 40 MG PO TABS
40.0000 mg | ORAL_TABLET | Freq: Every day | ORAL | Status: DC
  Filled 2011-09-12: qty 1

## 2011-09-12 MED ORDER — LEVOFLOXACIN IN D5W 750 MG/150ML IV SOLN
750.0000 mg | INTRAVENOUS | Status: AC
  Administered 2011-09-12 – 2011-09-14 (×3): 750 mg via INTRAVENOUS
  Filled 2011-09-12 (×3): qty 150

## 2011-09-12 MED ORDER — VITAMIN D 50 MCG (2000 UT) PO CAPS: 1.0000 | ORAL_CAPSULE | Freq: Every day | ORAL | Status: DC

## 2011-09-12 MED ORDER — FERROUS GLUCONATE 324 (38 FE) MG PO TABS
324.0000 mg | ORAL_TABLET | Freq: Every day | ORAL | Status: DC
  Administered 2011-09-12 – 2011-09-20 (×9): 324 mg via ORAL
  Filled 2011-09-12 (×13): qty 1

## 2011-09-12 NOTE — ED Notes (Signed)
Nurse unable to take report. Will call back  

## 2011-09-12 NOTE — Progress Notes (Signed)
[address]            [name] [address]          [phone]          Subjective: Pt known to our service, S/P recent AVR/single vessel CABG. Post-op, he had some pulmonary insufficiency and desats with exertion which was treated with diuresis and pulmonary toilet.  Ultimately discharged home on 09/08/2011 off O2.  Pt presented to ER yesterday c/o new onset DOE, nonproductive cough, chest discomfort.  He c/o shaking, but denies fever or chills.  Found on CXR to have a new right sided effusion with some consolidation in the right base. WBC 20K.  Pt admitted for IV abx and further workup.     Objective: Vital signs in last 24 hours: Patient Vitals for the past 24 hrs:  BP Temp Temp src Pulse Resp SpO2 Height Weight  09/12/11 0500 134/72 mmHg 98.5 F (36.9 C) Oral 92  24  98 % - -  09/12/11 0200 151/69 mmHg 99.6 F (37.6 C) Oral 112  24  95 % 5\' 10"  (1.778 m) 89.1 kg (196 lb 6.9 oz)  09/12/11 0100 - - - - - - 5' 10.08" (1.78 m) 92.6 kg (204 lb 2.3 oz)  09/12/11 0045 151/76 mmHg - - 109  31  96 % - -  09/12/11 0030 140/76 mmHg - - 108  28  96 % - -  09/12/11 0015 140/79 mmHg - - 109  27  96 % - -  09/12/11 0000 141/71 mmHg - - 108  28  95 % - -  09/11/11 2345 140/76 mmHg - - 108  27  96 % - -  09/11/11 2330 132/75 mmHg - - 109  24  91 % - -  09/11/11 2315 143/73 mmHg - - 109  26  97 % - -  09/11/11 2300 143/73 mmHg - - 111  22  97 % - -  09/11/11 2245 148/72 mmHg - - 109  25  97 % - -  09/11/11 2230 132/78 mmHg - - 107  24  97 % - -  09/11/11 2215 137/74 mmHg - - 105  24  98 % - -  09/11/11 2200 142/74 mmHg - - 105  22  99 % - -  09/11/11 2145 - - - 111  33  96 % - -  09/11/11 2130 114/75 mmHg - - 106  22  98 % - -  09/11/11 2115 133/79 mmHg - - 107  24  98 % - -  09/11/11 2015 118/60 mmHg - - 105  24  96 % - -  09/11/11 2000 125/73 mmHg - - 105  26  95 % - -  09/11/11 1945 126/74 mmHg - - 106  20  96 % - -  09/11/11 1934 125/78 mmHg 100.4  F (38 C) Oral 106  22  95 % - -  09/11/11 1930 - - - - - 96 % - -   Current Weight  09/12/11 89.1 kg (196 lb 6.9 oz)     Intake/Output from previous day:      PHYSICAL EXAM:  Heart: RRR Lungs: crackles, decreased BS in R base Wound: healing well Extremities: no edema  Lab Results: CBC: Basename 09/12/11 0700 09/11/11 2013 09/11/11 1952  WBC 20.5* -- 16.0*  HGB 10.1* 11.9* --  HCT 29.3* 35.0* --  PLT 208 -- 191   BMET:  Basename 09/12/11 0700 09/11/11 2013  NA 127* 129*  K 4.1 4.2  CL 94* 95*  CO2 22 --  GLUCOSE 130* 129*  BUN 14 16  CREATININE 1.10 1.40*  CALCIUM 8.7 --    PT/INR: No results found for this basename: LABPROT,INR in the last 72 hours  CXR: (5/4) 1. Lines and tubes removed. No pneumothorax or pulmonary edema.  2. New moderate right pleural effusion suspected. Cannot exclude  consolidation at the right lung base. Trace left pleural effusion.  Assessment/Plan: S/P  AVR/CABG, now with new R effusion and probable pneumonia. Will order a L lateral decubitus film to further delineate this efffusion, as it may require ultrasound guided thoracentesis. Would continue IV antibiotics in light of leukocytosis. Continue pulm toilet/IS. Stable from a cardiac standpoint.    LOS: 1 day    [name] H 09/12/2011

## 2011-09-12 NOTE — Progress Notes (Signed)
Subjective: Patient relates SOB on admission, feels breathing better this morning. Relates some chest pain last night, near site of incision.  Had a bowel movement.  Shakiness of right arm better.   Objective: Filed Vitals:   09/12/11 0045 09/12/11 0100 09/12/11 0200 09/12/11 0500  BP: 151/76  151/69 134/72  Pulse: 109  112 92  Temp:   99.6 F (37.6 C) 98.5 F (36.9 C)  TempSrc:   Oral Oral  Resp: 31  24 24   Height:  5' 10.08" (1.78 m) 5\' 10"  (1.778 m)   Weight:  92.6 kg (204 lb 2.3 oz) 89.1 kg (196 lb 6.9 oz)   SpO2: 96%  95% 98%   Weight change:  No intake or output data in the 24 hours ending 09/12/11 0812  General: Alert, awake, oriented x3, in no acute distress.  HEENT: No bruits, no goiter.  Heart: Regular rate and rhythm, without murmurs, rubs, gallops.  Lungs: Crackles right, no wheezes, decreases breath sounds, bilateral air movement.  Abdomen: Soft, nontender, nondistended, positive bowel sounds.  Neuro: Grossly intact, nonfocal. Extremities: no edema.   Lab Results:  [facility] 09/12/11 0700 09/11/11 2013  NA 127* 129*  K 4.1 4.2  CL 94* 95*  CO2 22 --  GLUCOSE 130* 129*  BUN 14 16  CREATININE 1.10 1.40*  CALCIUM 8.7 --  MG -- --  PHOS -- --    Basename 09/12/11 0700  AST 35  ALT 26  ALKPHOS 70  BILITOT 1.2  PROT 6.9  ALBUMIN 2.8*   Basename 09/12/11 0700 09/11/11 2013 09/11/11 1952  WBC 20.5* -- 16.0*  NEUTROABS PENDING -- 12.5*  HGB 10.1* 11.9* --  HCT 29.3* 35.0* --  MCV 80.9 -- 80.8  PLT 208 -- 191    Micro Results: Recent Results (from the past 240 hour(s))  CULTURE, EXPECTORATED SPUTUM-ASSESSMENT     Status: Normal   Collection Time   09/12/11  3:58 AM      Component Value Range Status Comment   Specimen Description SPUTUM   Final    Special Requests Normal   Final    Sputum evaluation     Final    Value: MICROSCOPIC FINDINGS SUGGEST THAT THIS SPECIMEN IS NOT REPRESENTATIVE OF LOWER RESPIRATORY SECRETIONS. PLEASE RECOLLECT.   CALLED TO J.TSOUTIS,RN 0513 09/12/11 M.CAMPBELL   Report Status 09/12/2011 FINAL   Final     Studies/Results: Dg Chest 2 View  09/11/2011  *RADIOLOGY REPORT*  Clinical Data: 71 year old male with fever.  Recent CABG.  CHEST - 2 VIEW  Comparison: 09/03/2011 and earlier.  Findings: Interval removal of the right IJ Swan-Ganz catheter, mediastinal and chest tubes.  No pneumothorax.  Stable cardiac size and mediastinal contours.  Sequelae of median sternotomy and cardiac valve replacement.  Stable cardiac size and mediastinal contours.  Visualized tracheal air column is within normal limits. Moderate right pleural effusion appears increased.   Associated dense opacity at the right lung base.  Trace left pleural effusion suspected.  No pulmonary edema.  IMPRESSION: 1.  Lines and tubes removed.  No pneumothorax or pulmonary edema. 2. New moderate right pleural effusion suspected.  Cannot exclude consolidation at the right lung base.  Trace left pleural effusion.  Original Report Authenticated By: Harley Hallmark, M.D.    Medications: I have reviewed the patient's current medications.   Patient Active Hospital Problem List:  Healthcare-associated pneumonia (09/12/2011) Continue with Vancomycin, Levaquin, cefepime day 1.    Hypoxic respiratory Failure:  Secondary to PNA, pleural effusion.  Improved. Continue with support care.  Follow blood culture.   Hypertension  Hold lasix. Continue with metoprolol.   Pleural effusion, bilateral (09/12/2011) Right more than left. Decubitus film order, will defer to CT surgery thoracentesis vs chest tube.   Leucocytosis (09/12/2011) Likely related to infection. Continue with IV antibiotics.   Hyponatremia: I will check urine osmolality. Hold lasix. Continue with IV fluids. Probably secondary to decrease volume but SIADH is in the differential.    LOS: 1 day   Hallelujah Wysong M.D.  [facility] 09/12/2011, 8:12 AM
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
## 2011-09-13 ENCOUNTER — Inpatient Hospital Stay (HOSPITAL_COMMUNITY): Payer: Medicare Other

## 2011-09-13 ENCOUNTER — Encounter (HOSPITAL_COMMUNITY): Payer: Self-pay | Admitting: Radiology

## 2011-09-13 DIAGNOSIS — J189 Pneumonia, unspecified organism: Secondary | ICD-10-CM

## 2011-09-13 DIAGNOSIS — J90 Pleural effusion, not elsewhere classified: Secondary | ICD-10-CM

## 2011-09-13 DIAGNOSIS — R0902 Hypoxemia: Secondary | ICD-10-CM

## 2011-09-13 DIAGNOSIS — I10 Essential (primary) hypertension: Secondary | ICD-10-CM

## 2011-09-13 DIAGNOSIS — B9689 Other specified bacterial agents as the cause of diseases classified elsewhere: Secondary | ICD-10-CM

## 2011-09-13 DIAGNOSIS — I2699 Other pulmonary embolism without acute cor pulmonale: Secondary | ICD-10-CM

## 2011-09-13 LAB — CBC
MCH: 27.6 pg (ref 26.0–34.0)
Platelets: 224 10*3/uL (ref 150–400)
RBC: 3.62 MIL/uL — ABNORMAL LOW (ref 4.22–5.81)
RDW: 13.6 % (ref 11.5–15.5)
WBC: 22.9 10*3/uL — ABNORMAL HIGH (ref 4.0–10.5)

## 2011-09-13 LAB — BASIC METABOLIC PANEL
CO2: 21 mEq/L (ref 19–32)
Calcium: 8.8 mg/dL (ref 8.4–10.5)
Chloride: 97 mEq/L (ref 96–112)
Creatinine, Ser: 1.17 mg/dL (ref 0.50–1.35)
GFR calc Af Amer: 70 mL/min — ABNORMAL LOW (ref >90)
Sodium: 129 mEq/L — ABNORMAL LOW (ref 135–145)

## 2011-09-13 LAB — CARDIAC PANEL(CRET KIN+CKTOT+MB+TROPI)
Relative Index: INVALID (ref 0.0–2.5)
Total CK: 61 U/L (ref 7–232)

## 2011-09-13 LAB — LACTIC ACID, PLASMA: Lactic Acid, Venous: 1.5 mmol/L (ref 0.5–2.2)

## 2011-09-13 MED ORDER — GUAIFENESIN 100 MG/5ML PO SOLN
10.0000 mL | ORAL | Status: DC | PRN
  Administered 2011-09-16 – 2011-09-19 (×8): 200 mg via ORAL
  Filled 2011-09-13 (×11): qty 10

## 2011-09-13 MED ORDER — HEPARIN BOLUS VIA INFUSION
5000.0000 [IU] | Freq: Once | INTRAVENOUS | Status: AC
  Administered 2011-09-13: 5000 [IU] via INTRAVENOUS
  Filled 2011-09-13: qty 5000

## 2011-09-13 MED ORDER — IOHEXOL 350 MG/ML SOLN
100.0000 mL | Freq: Once | INTRAVENOUS | Status: AC | PRN
  Administered 2011-09-13: 100 mL via INTRAVENOUS

## 2011-09-13 MED ORDER — SALINE SPRAY 0.65 % NA SOLN
2.0000 | NASAL | Status: DC | PRN
  Administered 2011-09-13 – 2011-09-15 (×2): 2 via NASAL
  Filled 2011-09-13: qty 44

## 2011-09-13 MED ORDER — HEPARIN (PORCINE) IN NACL 100-0.45 UNIT/ML-% IJ SOLN
1900.0000 [IU]/h | INTRAMUSCULAR | Status: AC
  Administered 2011-09-14 (×2): 1900 [IU]/h via INTRAVENOUS
  Administered 2011-09-14: 1650 [IU]/h via INTRAVENOUS
  Administered 2011-09-15 – 2011-09-16 (×2): 1900 [IU]/h via INTRAVENOUS
  Filled 2011-09-13 (×8): qty 250

## 2011-09-13 NOTE — Progress Notes (Addendum)
Patient is in no acute distress. No more hemoptysis. Patient had SCD on since admission. Patient diagnosed with Bilateral PE lower and upper lobe, Right heart strain by CT. Patient no hypotensive , Sat 95 2 L. Spoke Dr Cornelius Moras ok to start heparin.  will start Heparin IV per pharmacy. I consulted pulmonary. Will check Cardiac enzymes, lactic acid, BNP, lower extremities doppler.

## 2011-09-13 NOTE — Progress Notes (Signed)
   CARE MANAGEMENT NOTE 09/13/2011  Patient:  [name], [name]   Account Number:  [id number]  Date Initiated:  09/13/2011  Documentation initiated by:  Darlyne Russian  Subjective/Objective Assessment:   Patient admitted with HCAP     Action/Plan:   Progression of care and discharge planning   Anticipated DC Date:  09/17/2011   Anticipated DC Plan:  HOME W HOME HEALTH SERVICES      DC Planning Services  CM consult      Choice offered to / List presented to:             Status of service:  In process, will continue to follow Medicare Important Message given?   (If response is "NO", the following Medicare IM given date fields will be blank) Date Medicare IM given:   Date Additional Medicare IM given:    Discharge Disposition:    Per UR Regulation:  Reviewed for med. necessity/level of care/duration of stay  If discussed at Long Length of Stay Meetings, dates discussed:    Comments:  09/13/2011 [address] RN, [place]  [phone] Utilization review completed.  Patient dishcarged home 09/06/11 s/p CABG on 09/02/2011 for follow with MD office. On 09/12/11 he has increased weakness,shortness of breath and  elevated temp 101, in the ER POX i80% on RA.

## 2011-09-13 NOTE — Progress Notes (Signed)
Subjective: Patient still complaining of SOB, no worse but no better. Relates coughing small amount of blood last night. No hemoptysis today. Relates chest pain right side ribs.  Objective: Filed Vitals:   09/12/11 1030 09/12/11 1300 09/12/11 2100 09/13/11 0500  BP: 134/73 121/62 150/72 141/74  Pulse: 103 93 110 103  Temp:  98.8 F (37.1 C) 99.6 F (37.6 C) 100 F (37.8 C)  TempSrc:  Oral    Resp:  22 18 18   Height:      Weight:      SpO2:  100% 94% 94%   Weight change:   Intake/Output Summary (Last 24 hours) at 09/13/11 1157 Last data filed at 09/13/11 0600  Gross per 24 hour  Intake 2266.67 ml  Output    625 ml  Net 1641.67 ml    General: Alert, awake, oriented x3, in no acute distress.  HEENT: No bruits, no goiter.  Heart: Regular rate and rhythm, without murmurs, rubs, gallops.  Lungs: Crackles bases,  bilateral air movement.  Abdomen: Soft, nontender, nondistended, positive bowel sounds.  Neuro: Grossly intact, nonfocal. Extremities;SCD.   Lab Results:  Basename 09/13/11 0635 09/12/11 0700  NA 129* 127*  K 4.1 4.1  CL 97 94*  CO2 21 22  GLUCOSE 124* 130*  BUN 13 14  CREATININE 1.17 1.10  CALCIUM 8.8 8.7  MG -- --  PHOS -- --    Basename 09/12/11 0700  AST 35  ALT 26  ALKPHOS 70  BILITOT 1.2  PROT 6.9  ALBUMIN 2.8*   No results found for this basename: LIPASE:2,AMYLASE:2 in the last 72 hours  Basename 09/13/11 0635 09/12/11 0700 09/11/11 1952  WBC 22.9* 20.5* --  NEUTROABS -- 17.4* 12.5*  HGB 10.0* 10.1* --  HCT 29.3* 29.3* --  MCV 80.9 80.9 --  PLT 224 208 --    Micro Results: Recent Results (from the past 240 hour(s))  CULTURE, EXPECTORATED SPUTUM-ASSESSMENT     Status: Normal   Collection Time   09/12/11  3:58 AM      Component Value Range Status Comment   Specimen Description SPUTUM   Final    Special Requests Normal   Final    Sputum evaluation     Final    Value: MICROSCOPIC FINDINGS SUGGEST THAT THIS SPECIMEN IS NOT REPRESENTATIVE  OF LOWER RESPIRATORY SECRETIONS. PLEASE RECOLLECT.     CALLED TO J.TSOUTIS,RN 0513 09/12/11 M.CAMPBELL   Report Status 09/12/2011 FINAL   Final   CULTURE, BLOOD (ROUTINE X 2)     Status: Normal (Preliminary result)   Collection Time   09/12/11  6:50 AM      Component Value Range Status Comment   Specimen Description BLOOD RIGHT HAND   Final    Special Requests BOTTLES DRAWN AEROBIC ONLY 10CC   Final    Culture  Setup Time [id number]   Final    Culture     Final    Value:        BLOOD CULTURE RECEIVED NO GROWTH TO DATE CULTURE WILL BE HELD FOR 5 DAYS BEFORE ISSUING A FINAL NEGATIVE REPORT   Report Status PENDING   Incomplete   CULTURE, BLOOD (ROUTINE X 2)     Status: Normal (Preliminary result)   Collection Time   09/12/11  7:00 AM      Component Value Range Status Comment   Specimen Description BLOOD RIGHT ARM   Final    Special Requests BOTTLES DRAWN AEROBIC ONLY 10CC   Final  Culture  Setup Time [id number]   Final    Culture     Final    Value:        BLOOD CULTURE RECEIVED NO GROWTH TO DATE CULTURE WILL BE HELD FOR 5 DAYS BEFORE ISSUING A FINAL NEGATIVE REPORT   Report Status PENDING   Incomplete   CULTURE, EXPECTORATED SPUTUM-ASSESSMENT     Status: Normal   Collection Time   09/12/11 10:12 PM      Component Value Range Status Comment   Specimen Description SPUTUM   Final    Special Requests NONE   Final    Sputum evaluation     Final    Value: MICROSCOPIC FINDINGS SUGGEST THAT THIS SPECIMEN IS NOT REPRESENTATIVE OF LOWER RESPIRATORY SECRETIONS. PLEASE RECOLLECT.     CALLED TO Katha Hamming RN [id number] AT 2247 [address], S   Report Status 09/12/2011 FINAL   Final     Studies/Results: Dg Chest 2 View  09/11/2011  *RADIOLOGY REPORT*  Clinical Data: 71 year old male with fever.  Recent CABG.  CHEST - 2 VIEW  Comparison: 09/03/2011 and earlier.  Findings: Interval removal of the right IJ Swan-Ganz catheter, mediastinal and chest tubes.  No pneumothorax.  Stable cardiac size and  mediastinal contours.  Sequelae of median sternotomy and cardiac valve replacement.  Stable cardiac size and mediastinal contours.  Visualized tracheal air column is within normal limits. Moderate right pleural effusion appears increased.   Associated dense opacity at the right lung base.  Trace left pleural effusion suspected.  No pulmonary edema.  IMPRESSION: 1.  Lines and tubes removed.  No pneumothorax or pulmonary edema. 2. New moderate right pleural effusion suspected.  Cannot exclude consolidation at the right lung base.  Trace left pleural effusion.  Original Report Authenticated By: Harley Hallmark, M.D.   Dg Chest Right Decubitus  09/12/2011  *RADIOLOGY REPORT*  Clinical Data: Shortness of breath.  Effusion.  CHEST - RIGHT DECUBITUS  Comparison: Earlier the same day  Findings: Right side down decubitus film 1937 hours shows a small to moderate layering right pleural effusion.  Gas is visualized within non dependent bowel loops of the left upper quadrant.  IMPRESSION: Small to moderate layering right pleural effusion.  Original Report Authenticated By: ERIC A. MANSELL, M.D.   Dg Chest Left Decubitus  09/12/2011  *RADIOLOGY REPORT*  Clinical Data: Right pleural effusion  CHEST - LEFT DECUBITUS  Comparison:   the previous day's study  Findings: Previous median sternotomy and aortic valve replacement. There is persistent atelectasis/consolidation at the right lung base.  A right lateral decubitus radiograph would be better to assess for a free-flowing component of the previously documented right pleural effusion, if this is clinically relevant.  Visualized portions of left lung   are unremarkable. The left costophrenic angle is excluded.  IMPRESSION: Little change from previous day's exam  Original Report Authenticated By: Osa Craver, M.D.    Medications: I have reviewed the patient's current medications.  Healthcare-associated pneumonia (09/12/2011) Continue with Vancomycin, Levaquin,  cefepime day 2. Sputum culture pending.  Legionella antigen negative. Blood culture no growth.  Will check CT angio rule out PE, and to help define effusion. WBC increase today, repeat in am if continue to increase will need to change antibiotics. .   Hypoxic respiratory Failure:  Secondary to PNA, pleural effusion.  Continue with support care.  Patient relates pleuritic chest pain, hemoptysis, probably related to PNA but patient at risk for PE.   Hypertension  Hold  lasix. Continue with metoprolol.   Pleural effusion, bilateral (09/12/2011) Will defer to CT surgery thoracentesis vs chest tube. Right decubitus small to moderate pleural effusion. Ct might be able to help define better effusion.   Leucocytosis (09/12/2011) Likely related to infection. Continue with IV antibiotics.   Hyponatremia: urine osmolality elevated. Hold lasix. Continue with IV fluids. Probably secondary to decrease volume but SIADH is in the differential. Improved with IV fluids.        LOS: 2 days   Mikya Don M.D.  [facility] 09/13/2011, 11:57 AM

## 2011-09-13 NOTE — Care Management Note (Signed)
    Page 1 of 1   09/13/2011     4:13:58 PM   CARE MANAGEMENT NOTE 09/13/2011  Patient:  [name], [name]   Account Number:  [id number]  Date Initiated:  09/13/2011  Documentation initiated by:  Darlyne Russian  Subjective/Objective Assessment:   Patient admitted with HCAP     Action/Plan:   Progression of care and discharge planning   Anticipated DC Date:  09/17/2011   Anticipated DC Plan:  HOME W HOME HEALTH SERVICES      DC Planning Services  CM consult      Choice offered to / List presented to:             Status of service:  In process, will continue to follow Medicare Important Message given?   (If response is "NO", the following Medicare IM given date fields will be blank) Date Medicare IM given:   Date Additional Medicare IM given:    Discharge Disposition:    Per UR Regulation:  Reviewed for med. necessity/level of care/duration of stay  If discussed at Long Length of Stay Meetings, dates discussed:    Comments:  09/13/11 Onnie Boer, RN, BSN 1612 PT IS CURRENTLY HERE WITH PNA, PT WAS CURRENTLY ACTIVE WITH [facility] WITH AN RN AND A RW.  WILL F/U ON DC NEEDS.  09/13/2011 [address] RN, [place]  [phone] Utilization review completed.  Patient dishcarged home 09/06/11 s/p CABG on 09/02/2011 for follow with MD office. On 09/12/11 he has increased weakness,shortness of breath and  elevated temp 101, in the ER POX i80% on RA.

## 2011-09-13 NOTE — Progress Notes (Signed)
  Subjective:  Coughing all night, bringing up sputum and some blood in it. Pleuritic right lateral chest pain  Objective: Vital signs in last 24 hours: Temp:  [98.8 F (37.1 C)-100 F (37.8 C)] 100 F (37.8 C) (05/06 0500) Pulse Rate:  [93-110] 103  (05/06 0500) Cardiac Rhythm:  [-] Sinus tachycardia (05/05 2005) Resp:  [18-22] 18  (05/06 0500) BP: (121-150)/(62-74) 141/74 mmHg (05/06 0500) SpO2:  [94 %-100 %] 94 % (05/06 0500)  Hemodynamic parameters for last 24 hours:    Intake/Output from previous day: 05/05 0701 - 05/06 0700 In: 2506.7 [P.O.:720; I.V.:1386.7; IV Piggyback:400] Out: 625 [Urine:625] Intake/Output this shift:    General appearance: fatigued Heart: regular rate and rhythm, aortic valve sounds ok.  2/6 systolic murmur at apex radiating to axilla suggesting MR. Lungs: diminished breath sounds RLL Abdomen: soft, non-tender; bowel sounds normal; no masses,  no organomegaly Extremities: extremities normal, atraumatic, no cyanosis or edema Wound: incision ok  Lab Results:  Basename 09/13/11 0635 09/12/11 0700  WBC 22.9* 20.5*  HGB 10.0* 10.1*  HCT 29.3* 29.3*  PLT 224 208   BMET:  Basename 09/13/11 0635 09/12/11 0700  NA 129* 127*  K 4.1 4.1  CL 97 94*  CO2 21 22  GLUCOSE 124* 130*  BUN 13 14  CREATININE 1.17 1.10  CALCIUM 8.8 8.7    PT/INR: No results found for this basename: LABPROT,INR in the last 72 hours ABG    Component Value Date/Time   PHART 7.465* 09/12/2011 0454   HCO3 23.1 09/12/2011 0454   TCO2 24.1 09/12/2011 0454   ACIDBASEDEF 0.3 09/12/2011 0454   O2SAT 96.1 09/12/2011 0454   CBG (last 3)  No results found for this basename: GLUCAP:3 in the last 72 hours  CXR's reviewed.  The is RLL consolidation/collapse with small right pleural effusion  Assessment/Plan:  RLL pneumonia It may be useful to do CTA of chest to R/O PE given hypoxemia, blood in sputum, pleuritic chest pain.  All of this could be due to pneumonia.  Will leave that  decision up to medical team. I will order echo to R/O significant pericardial effusion and check heart valves. Will order flutter valve in addition to IS.   LOS: 2 days    Evelene Croon K 09/13/2011
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
## 2011-09-13 NOTE — Progress Notes (Signed)
Pt's sputum has been sent to lab x 2 and rejected both times.  Will request pt to collect another specimen.
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
## 2011-09-13 NOTE — Progress Notes (Signed)
*  PRELIMINARY RESULTS* Echocardiogram 2D Echocardiogram has been performed.  Glean Salen [facility] 09/13/2011, 4:38 PM

## 2011-09-14 ENCOUNTER — Inpatient Hospital Stay (HOSPITAL_COMMUNITY): Payer: Medicare Other

## 2011-09-14 DIAGNOSIS — I10 Essential (primary) hypertension: Secondary | ICD-10-CM

## 2011-09-14 DIAGNOSIS — J90 Pleural effusion, not elsewhere classified: Secondary | ICD-10-CM

## 2011-09-14 DIAGNOSIS — J189 Pneumonia, unspecified organism: Secondary | ICD-10-CM

## 2011-09-14 DIAGNOSIS — I2699 Other pulmonary embolism without acute cor pulmonale: Secondary | ICD-10-CM

## 2011-09-14 DIAGNOSIS — B9689 Other specified bacterial agents as the cause of diseases classified elsewhere: Secondary | ICD-10-CM

## 2011-09-14 DIAGNOSIS — R0602 Shortness of breath: Secondary | ICD-10-CM

## 2011-09-14 LAB — CBC
Platelets: 230 10*3/uL (ref 150–400)
RBC: 3.28 MIL/uL — ABNORMAL LOW (ref 4.22–5.81)
RDW: 14 % (ref 11.5–15.5)
WBC: 21 10*3/uL — ABNORMAL HIGH (ref 4.0–10.5)

## 2011-09-14 LAB — BASIC METABOLIC PANEL
CO2: 21 mEq/L (ref 19–32)
Calcium: 8.5 mg/dL (ref 8.4–10.5)
Creatinine, Ser: 1.16 mg/dL (ref 0.50–1.35)
GFR calc non Af Amer: 61 mL/min — ABNORMAL LOW (ref >90)
Glucose, Bld: 119 mg/dL — ABNORMAL HIGH (ref 70–99)
Sodium: 127 mEq/L — ABNORMAL LOW (ref 135–145)

## 2011-09-14 LAB — CARDIAC PANEL(CRET KIN+CKTOT+MB+TROPI)
CK, MB: 1.7 ng/mL (ref 0.3–4.0)
Relative Index: INVALID (ref 0.0–2.5)
Total CK: 64 U/L (ref 7–232)
Troponin I: <0.3 ng/mL (ref <0.30)
Troponin I: <0.3 ng/mL (ref <0.30)

## 2011-09-14 LAB — PROTIME-INR
INR: 1.73 — ABNORMAL HIGH (ref 0.00–1.49)
Prothrombin Time: 20.6 seconds — ABNORMAL HIGH (ref 11.6–15.2)

## 2011-09-14 LAB — URINALYSIS, ROUTINE W REFLEX MICROSCOPIC
Leukocytes, UA: NEGATIVE
Nitrite: NEGATIVE
Specific Gravity, Urine: 1.023 (ref 1.005–1.030)
Urobilinogen, UA: 4 mg/dL — ABNORMAL HIGH (ref 0.0–1.0)

## 2011-09-14 LAB — HEPARIN LEVEL (UNFRACTIONATED)
Heparin Unfractionated: 0.24 IU/mL — ABNORMAL LOW (ref 0.30–0.70)
Heparin Unfractionated: 0.57 IU/mL (ref 0.30–0.70)

## 2011-09-14 LAB — LACTATE DEHYDROGENASE: LDH: 635 U/L — ABNORMAL HIGH (ref 94–250)

## 2011-09-14 MED ORDER — HEPARIN BOLUS VIA INFUSION
2000.0000 [IU] | Freq: Once | INTRAVENOUS | Status: AC
  Administered 2011-09-14: 2000 [IU] via INTRAVENOUS
  Filled 2011-09-14: qty 2000

## 2011-09-14 NOTE — Progress Notes (Signed)
  Subjective:  Feels about the same today. Some SOB, right lateral pleuritic chest discomfort. Passed some blood in urine. Describes what sounds like a rigor earlier today.  Objective: Vital signs in last 24 hours: Temp:  [98.3 F (36.8 C)-100 F (37.8 C)] 98.3 F (36.8 C) (05/07 0745) Pulse Rate:  [89-112] 104  (05/07 0745) Cardiac Rhythm:  [-] Sinus tachycardia;Heart block (05/07 0755) Resp:  [18] 18  (05/07 0745) BP: (125-137)/(74-81) 130/75 mmHg (05/07 0745) SpO2:  [95 %-99 %] 99 % (05/07 0745)  Hemodynamic parameters for last 24 hours:    Intake/Output from previous day: 05/06 0701 - 05/07 0700 In: -  Out: 175 [Urine:175] Intake/Output this shift:    General appearance: alert and cooperative Neurologic: intact Heart: regular rate and rhythm, aortic valve prosthesis sounds ok.  2/6 systolic murmur at apex Lungs: diminished breath sounds RLL Abdomen: soft, non-tender; bowel sounds normal; no masses,  no organomegaly Wound: incisions ok  Lab Results:  Basename 09/13/11 0635 09/12/11 0700  WBC 22.9* 20.5*  HGB 10.0* 10.1*  HCT 29.3* 29.3*  PLT 224 208   BMET:  Basename 09/14/11 0220 09/13/11 0635  NA 127* 129*  K 4.2 4.1  CL 97 97  CO2 21 21  GLUCOSE 119* 124*  BUN 14 13  CREATININE 1.16 1.17  CALCIUM 8.5 8.8    PT/INR:  Basename 09/14/11 0220  LABPROT 20.6*  INR 1.73*   ABG    Component Value Date/Time   PHART 7.465* 09/12/2011 0454   HCO3 23.1 09/12/2011 0454   TCO2 24.1 09/12/2011 0454   ACIDBASEDEF 0.3 09/12/2011 0454   O2SAT 96.1 09/12/2011 0454   CBG (last 3)  No results found for this basename: GLUCAP:3 in the last 72 hours  Assessment/Plan: 1. Bilateral pulmonary embolism:  On heparin drip. I could not find the results of LE dopplers.  2. Moderate right pleural effusion with RLL atelectasis/consolidation.  He needs right thoracentesis. Will see if IR will do this using ultrasound.  If they will not do it on anticoagulation, I will do it but I  think ultrasound would be helpful.  Hold off on oral anticoagulant until this is done. 3. Echo shows normal prosthetic aortic valve function, no other significant valvular dysfunction and normal LV/RV function, no significant pericardial effusion. 4.  Possible RLL pneumonia.  Would continue antibiotic but narrow coverage as rec by CCM.   LOS: 3 days    Roena Sassaman K 09/14/2011

## 2011-09-14 NOTE — Progress Notes (Signed)
ANTICOAGULATION CONSULT NOTE   Pharmacy Consult for heparin Indication: Bilateral PE  Allergies  Allergen Reactions  . Codeine Other (See Comments)    Nervous and jittery.    Patient Measurements: Height: 5\' 10"  (177.8 cm) Weight: 196 lb 6.9 oz (89.1 kg) IBW/kg (Calculated) : 73  Heparin Dosing Weight: 89 Kg  Vital Signs: Temp: 99.1 F (37.3 C) (05/06 2252) Temp src: Oral (05/06 2252) BP: 137/78 mmHg (05/06 2252) Pulse Rate: 105  (05/06 2252)  Labs:  Basename 09/14/11 0220 09/13/11 1828 09/13/11 0635 09/12/11 0700 09/11/11 2013 09/11/11 1952  HGB -- -- 10.0* 10.1* -- --  HCT -- -- 29.3* 29.3* 35.0* --  PLT -- -- 224 208 -- 191  APTT -- -- -- -- -- --  LABPROT 20.6* -- -- -- -- --  INR 1.73* -- -- -- -- --  HEPARINUNFRC 0.24* -- -- -- -- --  CREATININE 1.16 -- 1.17 1.10 -- --  CKTOTAL 64 61 -- -- -- --  CKMB 1.7 1.9 -- -- -- --  TROPONINI <0.30 <0.30 -- -- -- --   Estimated Creatinine Clearance: 64.6 ml/min (by C-G formula based on Cr of 1.16).  Assessment: 71 yr old male with  bilateral PE for Heparin  Goal of Therapy:  Heparin level 0.3-0.7 units/ml   Plan:  Heparin 2000 units IV bolus, then increase Heparin 1650 units/hr Check heparin level in 6 hours.  Eddie Candle 09/14/2011,3:20 AM

## 2011-09-14 NOTE — Progress Notes (Signed)
After urinating pt noticed bloody discharge from his penis. Blood was bright red. Md on call notified. Will pass onto day shift nurse and wait for any new orders. Will continue to monitor the pt. VS stable and charted.  Sanda Linger

## 2011-09-14 NOTE — Progress Notes (Signed)
ANTICOAGULATION CONSULT NOTE   Pharmacy Consult for heparin Indication: Bilateral PE  Allergies  Allergen Reactions  . Codeine Other (See Comments)    Nervous and jittery.    Patient Measurements: Height: 5\' 10"  (177.8 cm) Weight: 196 lb 6.9 oz (89.1 kg) IBW/kg (Calculated) : 73  Heparin Dosing Weight: 89 Kg  Vital Signs: Temp: 98.3 F (36.8 C) (05/07 0745) Temp src: Oral (05/07 0500) BP: 128/72 mmHg (05/07 0932) Pulse Rate: 107  (05/07 0932)  Labs:  [name] 09/14/11 0955 09/14/11 0220 09/13/11 1828 09/13/11 0635 09/12/11 0700  HGB 9.2* -- -- 10.0* --  HCT 27.0* -- -- 29.3* 29.3*  PLT 230 -- -- 224 208  APTT -- -- -- -- --  LABPROT -- 20.6* -- -- --  INR -- 1.73* -- -- --  HEPARINUNFRC 0.24* 0.24* -- -- --  CREATININE -- 1.16 -- 1.17 1.10  CKTOTAL 70 64 61 -- --  CKMB 1.8 1.7 1.9 -- --  TROPONINI <0.30 <0.30 <0.30 -- --   Estimated Creatinine Clearance: 64.6 ml/min (by C-G formula based on Cr of 1.16).  Assessment: 71 yr old male with  bilateral PE for Heparin.  Heparin level remains 0.24 units/ml despite rate increase.  Goal of Therapy:  Heparin level 0.3-0.7 units/ml   Plan:  Heparin 2000 units IV bolus, then increase Heparin 1900 units/hr Check heparin level in 6 hours.  [name] 09/14/2011,11:13 AM

## 2011-09-14 NOTE — Progress Notes (Signed)
Spoke with Dr. Laneta Simmers regarding thoracentesis tomorrow. Per him, it should be ok to turn heparin off 2 hours prior to thoracentesis as long as it is restarted afterwards. Will relay message to Dr. Carmell Austria. Will continue to monitor patient closely and call with any changes. Ramond Craver, RN

## 2011-09-14 NOTE — Progress Notes (Signed)
Pt complaining of "fluttering feeling" in chest. EKG performed. Pt remains ST with first degree HB. VSS. 98.3, 104, 18, 98-99% on 2L. Pt states that it the same feeling that brought him back to the hospital. [name] made aware. No new orders. Will continue to monitor patient closely and call with concerns. Ramond Craver, RN

## 2011-09-14 NOTE — Progress Notes (Signed)
*  PRELIMINARY RESULTS* Vascular Ultrasound Bilateral lower extremity venous duplex has been completed.   Bilateral lower extremities are negative for deep vein thrombosis.  Malachy Moan, RDMS, RDCS 09/14/2011, 10:38 AM

## 2011-09-14 NOTE — Progress Notes (Signed)
Subjective: Patient relates right side chest pain on and off. Breathing no better no worse.  Had some blood in urine today. No hemoptysis.   Objective: Filed Vitals:   09/13/11 2252 09/14/11 0500 09/14/11 0745 09/14/11 0932  BP: 137/78 125/74 130/75 128/72  Pulse: 105 104 104 107  Temp: 99.1 F (37.3 C) 98.7 F (37.1 C) 98.3 F (36.8 C)   TempSrc: Oral Oral    Resp: 18 18 18    Height:      Weight:      SpO2: 97% 96% 99%    Weight change:    General: Alert, awake, oriented x3, in no acute distress.  HEENT: No bruits, no goiter.  Heart: Regular rate and rhythm, without murmurs, rubs, gallops.  Lungs: Crackles bases, bilateral air movement.  Abdomen: Soft, nontender, nondistended, positive bowel sounds.  Neuro: Grossly intact, nonfocal. Extremities; no edema.   Lab Results:  [employer] 09/14/11 0220 09/13/11 0635  NA 127* 129*  K 4.2 4.1  CL 97 97  CO2 21 21  GLUCOSE 119* 124*  BUN 14 13  CREATININE 1.16 1.17  CALCIUM 8.5 8.8  MG -- --  PHOS -- --    Basename 09/12/11 0700  AST 35  ALT 26  ALKPHOS 70  BILITOT 1.2  PROT 6.9  ALBUMIN 2.8*    Basename 09/14/11 0955 09/13/11 0635 09/12/11 0700 09/11/11 1952  WBC 21.0* 22.9* -- --  NEUTROABS -- -- 17.4* 12.5*  HGB 9.2* 10.0* -- --  HCT 27.0* 29.3* -- --  MCV 82.3 80.9 -- --  PLT 230 224 -- --    Basename 09/14/11 0955 09/14/11 0220 09/13/11 1828  CKTOTAL 70 64 61  CKMB 1.8 1.7 1.9  CKMBINDEX -- -- --  TROPONINI <0.30 <0.30 <0.30    Micro Results: Recent Results (from the past 240 hour(s))  CULTURE, EXPECTORATED SPUTUM-ASSESSMENT     Status: Normal   Collection Time   09/12/11  3:58 AM      Component Value Range Status Comment   Specimen Description SPUTUM   Final    Special Requests Normal   Final    Sputum evaluation     Final    Value: MICROSCOPIC FINDINGS SUGGEST THAT THIS SPECIMEN IS NOT REPRESENTATIVE OF LOWER RESPIRATORY SECRETIONS. PLEASE RECOLLECT.     CALLED TO J.TSOUTIS,RN 0513 09/12/11  M.CAMPBELL   Report Status 09/12/2011 FINAL   Final   CULTURE, BLOOD (ROUTINE X 2)     Status: Normal (Preliminary result)   Collection Time   09/12/11  6:50 AM      Component Value Range Status Comment   Specimen Description BLOOD RIGHT HAND   Final    Special Requests BOTTLES DRAWN AEROBIC ONLY 10CC   Final    Culture  Setup Time [id number]   Final    Culture     Final    Value:        BLOOD CULTURE RECEIVED NO GROWTH TO DATE CULTURE WILL BE HELD FOR 5 DAYS BEFORE ISSUING A FINAL NEGATIVE REPORT   Report Status PENDING   Incomplete   CULTURE, BLOOD (ROUTINE X 2)     Status: Normal (Preliminary result)   Collection Time   09/12/11  7:00 AM      Component Value Range Status Comment   Specimen Description BLOOD RIGHT ARM   Final    Special Requests BOTTLES DRAWN AEROBIC ONLY 10CC   Final    Culture  Setup Time [id number]   Final  Culture     Final    Value:        BLOOD CULTURE RECEIVED NO GROWTH TO DATE CULTURE WILL BE HELD FOR 5 DAYS BEFORE ISSUING A FINAL NEGATIVE REPORT   Report Status PENDING   Incomplete   CULTURE, EXPECTORATED SPUTUM-ASSESSMENT     Status: Normal   Collection Time   09/12/11 10:12 PM      Component Value Range Status Comment   Specimen Description SPUTUM   Final    Special Requests NONE   Final    Sputum evaluation     Final    Value: MICROSCOPIC FINDINGS SUGGEST THAT THIS SPECIMEN IS NOT REPRESENTATIVE OF LOWER RESPIRATORY SECRETIONS. PLEASE RECOLLECT.     CALLED TO Katha Hamming RN [phone] AT [address] [facility] [address]   Report Status 09/12/2011 FINAL   Final     Studies/Results: Dg Chest 2 View  09/14/2011  *RADIOLOGY REPORT*  Clinical Data: Evaluate pleural effusion.  History of pulmonary embolism.  CHEST - 2 VIEW  Comparison: Chest CT 09/13/2011 and chest radiograph 09/12/2011  Findings: Two views of the chest demonstrate a moderate sized right pleural effusion that appears to be minimally changed.  There are low lung volumes but the left lung remains clear.   Heart size is grossly stable.  Postoperative changes consistent with median sternotomy and aortic valve replacement.  There is no evidence for a pneumothorax.  Trachea is midline.  IMPRESSION: Persistent right pleural effusion.  Low lung volumes.  Original Report Authenticated By: Richarda Overlie, M.D.   Ct Angio Chest W/cm &/or Wo Cm  09/13/2011  *RADIOLOGY REPORT*  Clinical Data: Hypoxemia.  Hemoptysis.  Pulmonary embolism.  CT ANGIOGRAPHY CHEST  Technique:  Multidetector CT imaging of the chest using the standard protocol during bolus administration of intravenous contrast. Multiplanar reconstructed images including MIPs were obtained and reviewed to evaluate the vascular anatomy.  Contrast: OMNIPAQUE IOHEXOL 350 MG/ML SOLN  Comparison: Chest radiograph 09/12/2011.  Findings: Technically adequate study that is positive for pulmonary embolism.  Pulmonary embolus is present in the descending left pulmonary artery.  The study is mildly degraded by respiratory motion artifact.  Left lower lobe posterior basal segment pulmonary embolus is also identified.  Extensive embolus extends into the right upper, right lower lobe pulmonary arteries.  Postoperative changes median sternotomy, presumably for CABG.  Small pericardial effusion.  Moderate right pleural effusion is present with associated atelectasis.  Incidental imaging the upper abdomen is within normal limits.  There are no aggressive osseous lesions identified.  Sternoclavicular joint osteoarthritis is present.  There is straightening of the interventricular septum of the heart, suggesting right heart strain.  Bilateral gynecomastia incidentally noted.  No axillary adenopathy.  Bioprosthetic aortic valve replacement noted.  IMPRESSION: 1.  Positive study for pulmonary embolus.  Bilateral lower lobe and right upper lobe pulmonary emboli are identified.  Straightening of the intraventricular septum suggest right heart strain. 2.  Moderate right pleural effusion  with collapse / consolidation most promptly affecting the right lower lobe. 3.  Small pericardial effusion.  Postoperative changes of CABG and AVR.  Critical Value/emergent results were called by telephone at the time of interpretation on 09/13/2010  at 1732 hours  to  Dr. Sunnie Nielsen, who verbally acknowledged these results.  Original Report Authenticated By: Andreas Newport, M.D.   Dg Chest Right Decubitus  09/12/2011  *RADIOLOGY REPORT*  Clinical Data: Shortness of breath.  Effusion.  CHEST - RIGHT DECUBITUS  Comparison: Earlier the same day  Findings:  Right side down decubitus film 1937 hours shows a small to moderate layering right pleural effusion.  Gas is visualized within non dependent bowel loops of the left upper quadrant.  IMPRESSION: Small to moderate layering right pleural effusion.  Original Report Authenticated By: ERIC A. MANSELL, M.D.   Dg Chest Left Decubitus  09/12/2011  *RADIOLOGY REPORT*  Clinical Data: Right pleural effusion  CHEST - LEFT DECUBITUS  Comparison:   the previous day's study  Findings: Previous median sternotomy and aortic valve replacement. There is persistent atelectasis/consolidation at the right lung base.  A right lateral decubitus radiograph would be better to assess for a free-flowing component of the previously documented right pleural effusion, if this is clinically relevant.  Visualized portions of left lung   are unremarkable. The left costophrenic angle is excluded.  IMPRESSION: Little change from previous day's exam  Original Report Authenticated By: Osa Craver, M.D.    Medications: I have reviewed the patient's current medications.   Healthcare-associated pneumonia (09/12/2011) Continue with Vancomycin, Levaquin, cefepime day 3. Sputum culture pending.  Legionella antigen negative. Blood culture no growth.  WBC at 21. I will consider discontinue Levaquin 5-8 if WBC continue to decreases and no fevers.   Bilateral Pulmonary Embolism:  Continue with  Heparin. Patient will have thoracentesis today. ECHO negative for Right side hearth strain. Cardiac enzymes times 3 negative. Appreciate pulmonary evaluation. Will consider transition to Rivaroxaban.   Will monitor for Hypoxemia, Hypotension.   Hypoxic respiratory Failure:  Secondary to PNA, pleural effusion, Pulmonary Embolism.  Continue with support care.    Hypertension  Hold lasix. Continue with metoprolol.   Pleural effusion, bilateral (09/12/2011) For thoracentesis today. This might help with Dyspnea. Please send fluid for Culture, gram stain, LDH, Protein.   Leucocytosis (09/12/2011) Likely related to infection. Continue with IV antibiotics.   Hyponatremia: urine osmolality elevated. Hold lasix. Continue with IV fluids. Probably secondary to decrease volume but SIADH is in the differential. Improved with IV fluids. Na decrease today. Monitor.  Hematuria: In setting Anticoagulation, I will decrease aspirin to 81 mg. I will check UA.  Aortic Valve replacement// CABG * 1: ECHO valve functioning. Cardiac enzymes negative. Decrease aspirin to 81 mg due to risk for bleeding patient getting IV heparin. Will inform CT Vascular.        LOS: 3 days   Caroleen Stoermer M.D.  [facility] 09/14/2011, 10:54 AM

## 2011-09-14 NOTE — Progress Notes (Signed)
New orders received from on call MD. Will pass on to on coming RN & continue to monitor the pt. [name]

## 2011-09-14 NOTE — Progress Notes (Signed)
Pt HR noted to be in afib with rate 80-90s. Pt is asymptomatic at the time. Vitals remain stable. Dr. Sunnie Nielsen notified and made aware. No new orders at this time as patient is already on heparin and metoprolol. Will continue to monitor patient closely. Ramond Craver, RN

## 2011-09-14 NOTE — Progress Notes (Signed)
ANTICOAGULATION CONSULT NOTE   Pharmacy Consult for heparin Indication: Bilateral PE  Assessment: 71 yr old male with  bilateral PE for Heparin.  Heparin level now within desired goal range at 0.57 IU/ml.  No overt bleeding noted and plans for thoracentesis tomorrow as indicated.  Goal of Therapy:  Heparin level 0.3-0.7 units/ml   Plan:   Will continue IV Heparin at 1900 units/hr  Check heparin level in the morning and adjust as needed.  Nadara Mustard, PharmD., MS Clinical Pharmacist Pager:  [phone] Thank you for allowing pharmacy to be part of this patients care team. 09/14/2011,7:32 PM   Allergies  Allergen Reactions  . Codeine Other (See Comments)    Nervous and jittery.    Patient Measurements: Height: 5\' 10"  (177.8 cm) Weight: 196 lb 6.9 oz (89.1 kg) IBW/kg (Calculated) : 73  Heparin Dosing Weight: 89 Kg  Vital Signs: Temp: 98.4 F (36.9 C) (05/07 1323) Temp src: Oral (05/07 1323) BP: 118/67 mmHg (05/07 1323) Pulse Rate: 77  (05/07 1323)  Labs:  Basename 09/14/11 1751 09/14/11 0955 09/14/11 0220 09/13/11 1828 09/13/11 0635 09/12/11 0700  HGB -- 9.2* -- -- 10.0* --  HCT -- 27.0* -- -- 29.3* 29.3*  PLT -- 230 -- -- 224 208  APTT -- -- -- -- -- --  LABPROT -- -- 20.6* -- -- --  INR -- -- 1.73* -- -- --  HEPARINUNFRC 0.57 0.24* 0.24* -- -- --  CREATININE -- -- 1.16 -- 1.17 1.10  CKTOTAL -- 70 64 61 -- --  CKMB -- 1.8 1.7 1.9 -- --  TROPONINI -- <0.30 <0.30 <0.30 -- --   Estimated Creatinine Clearance: 64.6 ml/min (by C-G formula based on Cr of 1.16).

## 2011-09-15 ENCOUNTER — Inpatient Hospital Stay (HOSPITAL_COMMUNITY): Payer: Medicare Other

## 2011-09-15 DIAGNOSIS — J189 Pneumonia, unspecified organism: Secondary | ICD-10-CM

## 2011-09-15 DIAGNOSIS — J90 Pleural effusion, not elsewhere classified: Secondary | ICD-10-CM

## 2011-09-15 DIAGNOSIS — I10 Essential (primary) hypertension: Secondary | ICD-10-CM

## 2011-09-15 DIAGNOSIS — B9689 Other specified bacterial agents as the cause of diseases classified elsewhere: Secondary | ICD-10-CM

## 2011-09-15 DIAGNOSIS — I2699 Other pulmonary embolism without acute cor pulmonale: Secondary | ICD-10-CM

## 2011-09-15 LAB — CBC
HCT: 22.8 % — ABNORMAL LOW (ref 39.0–52.0)
Hemoglobin: 7.7 g/dL — ABNORMAL LOW (ref 13.0–17.0)
MCV: 80.9 fL (ref 78.0–100.0)
Platelets: 190 10*3/uL (ref 150–400)
RBC: 2.82 MIL/uL — ABNORMAL LOW (ref 4.22–5.81)
WBC: 14.5 10*3/uL — ABNORMAL HIGH (ref 4.0–10.5)

## 2011-09-15 LAB — TYPE AND SCREEN: Antibody Screen: NEGATIVE

## 2011-09-15 MED ORDER — METOPROLOL TARTRATE 25 MG PO TABS
37.5000 mg | ORAL_TABLET | Freq: Two times a day (BID) | ORAL | Status: DC
  Administered 2011-09-15 – 2011-09-16 (×3): 37.5 mg via ORAL
  Filled 2011-09-15 (×6): qty 1

## 2011-09-15 NOTE — Progress Notes (Signed)
Pt was noted to be in NSR around 1915. An ekg was done which showed NSR/ST with 1st degree AV block. VS were stable and charted. MD on call was notified of rhythm change. Will continue to monitor the pt. Sanda Linger

## 2011-09-15 NOTE — Progress Notes (Signed)
Subjective: Patient feeling better. No BM, no blood stool. No hemoptysis. He had small amount epistaxis.  Breathing better.   Objective: Filed Vitals:   09/14/11 0932 09/14/11 1323 09/14/11 2100 09/15/11 0500  BP: 128/72 118/67 129/70 144/74  Pulse: 107 77 109 102  Temp:  98.4 F (36.9 C) 99.5 F (37.5 C) 98.6 F (37 C)  TempSrc:  Oral    Resp:  20 18 18   Height:      Weight:      SpO2:  94% 97% 94%   Weight change:    General: Alert, awake, oriented x3, in no acute distress.  HEENT: No bruits, no goiter.  Heart: Regular rate and rhythm, without murmurs, rubs, gallops.  Lungs: Crackles bases, bilateral air movement.  Abdomen: Soft, nontender, nondistended, positive bowel sounds.  Neuro: Grossly intact, nonfocal. Extremities: trace edema.   Lab Results:  [facility] 09/14/11 0220 09/13/11 0635  NA 127* 129*  K 4.2 4.1  CL 97 97  CO2 21 21  GLUCOSE 119* 124*  BUN 14 13  CREATININE 1.16 1.17  CALCIUM 8.5 8.8  MG -- --  PHOS -- --    Basename 09/15/11 0530 09/14/11 0955  WBC 14.5* 21.0*  NEUTROABS -- --  HGB 7.7* 9.2*  HCT 22.8* 27.0*  MCV 80.9 82.3  PLT 190 230    Basename 09/14/11 0955 09/14/11 0220 09/13/11 1828  CKTOTAL 70 64 61  CKMB 1.8 1.7 1.9  CKMBINDEX -- -- --  TROPONINI <0.30 <0.30 <0.30    Micro Results: Recent Results (from the past 240 hour(s))  CULTURE, EXPECTORATED SPUTUM-ASSESSMENT     Status: Normal   Collection Time   09/12/11  3:58 AM      Component Value Range Status Comment   Specimen Description SPUTUM   Final    Special Requests Normal   Final    Sputum evaluation     Final    Value: MICROSCOPIC FINDINGS SUGGEST THAT THIS SPECIMEN IS NOT REPRESENTATIVE OF LOWER RESPIRATORY SECRETIONS. PLEASE RECOLLECT.     CALLED TO J.TSOUTIS,RN 0513 09/12/11 M.CAMPBELL   Report Status 09/12/2011 FINAL   Final   CULTURE, BLOOD (ROUTINE X 2)     Status: Normal (Preliminary result)   Collection Time   09/12/11  6:50 AM      Component Value Range  Status Comment   Specimen Description BLOOD RIGHT HAND   Final    Special Requests BOTTLES DRAWN AEROBIC ONLY 10CC   Final    Culture  Setup Time [id number]   Final    Culture     Final    Value:        BLOOD CULTURE RECEIVED NO GROWTH TO DATE CULTURE WILL BE HELD FOR 5 DAYS BEFORE ISSUING A FINAL NEGATIVE REPORT   Report Status PENDING   Incomplete   CULTURE, BLOOD (ROUTINE X 2)     Status: Normal (Preliminary result)   Collection Time   09/12/11  7:00 AM      Component Value Range Status Comment   Specimen Description BLOOD RIGHT ARM   Final    Special Requests BOTTLES DRAWN AEROBIC ONLY 10CC   Final    Culture  Setup Time [id number]   Final    Culture     Final    Value:        BLOOD CULTURE RECEIVED NO GROWTH TO DATE CULTURE WILL BE HELD FOR 5 DAYS BEFORE ISSUING A FINAL NEGATIVE REPORT   Report Status PENDING   Incomplete  CULTURE, EXPECTORATED SPUTUM-ASSESSMENT     Status: Normal   Collection Time   09/12/11 10:12 PM      Component Value Range Status Comment   Specimen Description SPUTUM   Final    Special Requests NONE   Final    Sputum evaluation     Final    Value: MICROSCOPIC FINDINGS SUGGEST THAT THIS SPECIMEN IS NOT REPRESENTATIVE OF LOWER RESPIRATORY SECRETIONS. PLEASE RECOLLECT.     CALLED TO Katha Hamming RN [phone] AT [address] [facility], [address]   Report Status 09/12/2011 FINAL   Final     Studies/Results: Dg Chest 2 View  09/14/2011  *RADIOLOGY REPORT*  Clinical Data: Evaluate pleural effusion.  History of pulmonary embolism.  CHEST - 2 VIEW  Comparison: Chest CT 09/13/2011 and chest radiograph 09/12/2011  Findings: Two views of the chest demonstrate a moderate sized right pleural effusion that appears to be minimally changed.  There are low lung volumes but the left lung remains clear.  Heart size is grossly stable.  Postoperative changes consistent with median sternotomy and aortic valve replacement.  There is no evidence for a pneumothorax.  Trachea is midline.   IMPRESSION: Persistent right pleural effusion.  Low lung volumes.  Original Report Authenticated By: Richarda Overlie, M.D.   Ct Angio Chest W/cm &/or Wo Cm  09/13/2011  *RADIOLOGY REPORT*  Clinical Data: Hypoxemia.  Hemoptysis.  Pulmonary embolism.  CT ANGIOGRAPHY CHEST  Technique:  Multidetector CT imaging of the chest using the standard protocol during bolus administration of intravenous contrast. Multiplanar reconstructed images including MIPs were obtained and reviewed to evaluate the vascular anatomy.  Contrast: OMNIPAQUE IOHEXOL 350 MG/ML SOLN  Comparison: Chest radiograph 09/12/2011.  Findings: Technically adequate study that is positive for pulmonary embolism.  Pulmonary embolus is present in the descending left pulmonary artery.  The study is mildly degraded by respiratory motion artifact.  Left lower lobe posterior basal segment pulmonary embolus is also identified.  Extensive embolus extends into the right upper, right lower lobe pulmonary arteries.  Postoperative changes median sternotomy, presumably for CABG.  Small pericardial effusion.  Moderate right pleural effusion is present with associated atelectasis.  Incidental imaging the upper abdomen is within normal limits.  There are no aggressive osseous lesions identified.  Sternoclavicular joint osteoarthritis is present.  There is straightening of the interventricular septum of the heart, suggesting right heart strain.  Bilateral gynecomastia incidentally noted.  No axillary adenopathy.  Bioprosthetic aortic valve replacement noted.  IMPRESSION: 1.  Positive study for pulmonary embolus.  Bilateral lower lobe and right upper lobe pulmonary emboli are identified.  Straightening of the intraventricular septum suggest right heart strain. 2.  Moderate right pleural effusion with collapse / consolidation most promptly affecting the right lower lobe. 3.  Small pericardial effusion.  Postoperative changes of CABG and AVR.  Critical Value/emergent results  were called by telephone at the time of interpretation on 09/13/2010  at 1732 hours  to  Dr. Sunnie Nielsen, who verbally acknowledged these results.  Original Report Authenticated By: Andreas Newport, M.D.    Medications: I have reviewed the patient's current medications.  Healthcare-associated pneumonia (09/12/2011) Continue  cefepime day 4.  Levaquin: started 5-5 stopped  5-7 Vancomycin: started 5-5 Stopped 5-8.  Legionella antigen negative. Blood culture no growth.  WBC decrease to 14.  Bilateral Pulmonary Embolism:  Continue with Heparin. Patient will have thoracentesis today. ECHO negative for Right side hearth strain. Cardiac enzymes times 3 negative. Appreciate pulmonary evaluation. Will consider transition to Rivaroxaban.  Will monitor for Hypoxemia, Hypotension.  Doppler preliminary no DVT.   Hypoxic respiratory Failure:  Secondary to PNA, pleural effusion, Pulmonary Embolism.  Continue with support care.   Hypertension  Continue with metoprolol.   Pleural effusion, bilateral (09/12/2011) For thoracentesis today. This might help with Dyspnea. Please send fluid for Culture, gram stain, LDH, Protein.   Leucocytosis (09/12/2011) Likely related to infection. Continue with IV antibiotics.   Hyponatremia: urine osmolality elevated. Hold lasix. Probably secondary to decrease volume but SIADH is in the differential. Will follow B-met depending result with consider stop fluids.   Hematuria: In setting Anticoagulation, I will decrease aspirin to 81 mg.  UA small amount hb. Resolved.   Anemia; Probably acute illness, hemodilution. Will monitor for bleed. Check anemia panel. Continue with ferrous sulfate.   Aortic Valve replacement// CABG * 1: ECHO valve functioning. Cardiac enzymes negative. Decrease aspirin to 81 mg due to risk for bleeding patient getting IV heparin.  A fib: Metoprolol increases. Patient on heparin.          LOS: 4 days   Mattthew Ziomek M.D.  [facility] 09/15/2011, 10:04 AM

## 2011-09-15 NOTE — Progress Notes (Signed)
Name: [name] MRN: [id number] DOB: Aug 04, 1940  LOS: 4  CRITICAL CARE ADMISSION NOTE  History of Present Illness: This is a pleasant 71 y/o male who underwent an aortic valve replacement with a bioprosthetic valve and 1 vessel CABG on 09/02/11 who was re-admitted to [facility] on 09/12/2011 with pneumonia.  He initially presented with fatigue and hand shaking but later developed dyspnea and fever. On 5/5 he developed cough productive of bloody sputum.  On 5/6 he was sent for a CT angio chest based on the hemoptysis and was found to have bilateral pulmonary emboli with evidence of RV strain.  A TTE performed in the afternoon of 5/6 (around the time of the CT) showed an estimated RVSP of 47 but no RV dilation.  PCCM was consulted for further management.    Lines / Drains:   Cultures / Sepsis markers: 5/5 blood >> 5/5 sputum >> poor spec 5/6 sputum >>  Antibiotics: 5/5 Vanc (HCAP) >>5/7 5/5 Cefepime (HCAP) >> 5/5 Levaquin (HCAP) >>5/7  Tests / Events: 4/25 AVR and CABG 5/6 CT Angio chest>> bilateral PE, R pleural effusion, RLL consolidation 5/6 2D TTE>> NL LVEF, RVSP 47, NL RV     Subjective/Overnight:  No new c/o.  Unable to do thoracentesis this am, "not enough fluid".   Vital Signs:   Filed Vitals:   09/14/11 0932 09/14/11 1323 09/14/11 2100 09/15/11 0500  BP: 128/72 118/67 129/70 144/74  Pulse: 107 77 109 102  Temp:  98.4 F (36.9 C) 99.5 F (37.5 C) 98.6 F (37 C)  TempSrc:  Oral    Resp:  20 18 18   Height:      Weight:      SpO2:  94% 97% 94%  2 L [place]  Physical Examination: Gen: pleasant, chronically ill appearing male, NAD in bed  HEENT: NCAT, PERRL, EOMi, OP clear,  Neck: supple without masses PULM: CTA on Left, few scattered ronchi, diminished R base CV: RRR, no mgr, no JVD Chest: median sternotomy normal AB: BS+, soft, nontender, no hsm Ext: warm, no edema, no clubbing, no cyanosis Derm: no rash or skin breakdown Neuro: A&Ox4, CN II-XII intact, strength 5/5 in  all 4 extremities Psyche: Normal mood and affect  Labs and Imaging:   CBC    Component Value Date/Time   WBC 14.5* 09/15/2011 0530   RBC 2.82* 09/15/2011 0530   HGB 7.7* 09/15/2011 0530   HCT 22.8* 09/15/2011 0530   PLT 190 09/15/2011 0530   MCV 80.9 09/15/2011 0530   MCH 27.3 09/15/2011 0530   MCHC 33.8 09/15/2011 0530   RDW 14.3 09/15/2011 0530   LYMPHSABS 1.0 09/12/2011 0700   MONOABS 2.1* 09/12/2011 0700   EOSABS 0.0 09/12/2011 0700   BASOSABS 0.0 09/12/2011 0700   BMET    Component Value Date/Time   NA 127* 09/14/2011 0220   K 4.2 09/14/2011 0220   CL 97 09/14/2011 0220   CO2 21 09/14/2011 0220   GLUCOSE 119* 09/14/2011 0220   BUN 14 09/14/2011 0220   CREATININE 1.16 09/14/2011 0220   CALCIUM 8.5 09/14/2011 0220   GFRNONAA 61* 09/14/2011 0220   GFRAA 71* 09/14/2011 0220    Assessment and Plan: 71 y/o male with HCAP and bilateral PE and R effusion after an aortic valve replacement and CABG on 4/25.  PCCM consulted for concern of RV strain.  There is no evidence of RV strain on 2D TTE, however the RVSP is slightly elevated likely due to the acute pulmonary embolism.  Pulmonary embolism (09/13/2011)   Assessment: Due to recent hospitalization and surgery; No evidence of RV strain   Plan:   -would transition to Rivaroxaban 5/8 since no thoracentesis performed  -given that this was a provoked PE, would treat for 3-6 months and then d/c anticoagulation  Healthcare-associated pneumonia (09/12/2011)   Assessment: RLL, no organism identified as of yet; now on cefepime alone   Plan:   -would treat with Cefepime for 7 day course  -pulmonary hygiene, OOB  Pleural effusion, bilateral (09/12/2011)   Assessment: parapneumonic, appears to be small; doubt they are contributing signficantly to his symptoms; ddx includes post cardiac surgery effusion vs. related to PE; Per IR/CVTS not enough fluid to tap.    Plan:   -f/u CXR   PCCM signing off please call if needed.    Danford Bad, NP 09/15/2011  11:34 AM Pager:  [phone]    Seen and discussed with ACNP above.  Pt examined and database reviewed. I agree with above findings, assessment and plan as reflected in the note above.    Billy Fischer, MD;  PCCM service; Mobile [phone]

## 2011-09-15 NOTE — Progress Notes (Signed)
ANTICOAGULATION CONSULT NOTE - Follow Up Consult  Pharmacy Consult for heparin Indication: B PE  Allergies  Allergen Reactions  . Codeine Other (See Comments)    Nervous and jittery.    Patient Measurements: Height: 5\' 10"  (177.8 cm) Weight: 196 lb 6.9 oz (89.1 kg) IBW/kg (Calculated) : 73  Heparin Dosing Weight: 89 kg  Vital Signs: Temp: 98.9 F (37.2 C) (05/08 2043) Temp src: Oral (05/08 2043) BP: 150/77 mmHg (05/08 2043) Pulse Rate: 109  (05/08 2043)  Labs:  [name] 09/15/11 2040 09/15/11 0530 09/14/11 1751 09/14/11 0955 09/14/11 0220 09/13/11 1828 09/13/11 0635  HGB -- 7.7* -- 9.2* -- -- --  HCT -- 22.8* -- 27.0* -- -- 29.3*  PLT -- 190 -- 230 -- -- 224  APTT -- -- -- -- -- -- --  LABPROT -- -- -- -- 20.6* -- --  INR -- -- -- -- 1.73* -- --  HEPARINUNFRC 0.42 0.28* 0.57 -- -- -- --  CREATININE -- -- -- -- 1.16 -- 1.17  CKTOTAL -- -- -- 70 64 61 --  CKMB -- -- -- 1.8 1.7 1.9 --  TROPONINI -- -- -- <0.30 <0.30 <0.30 --    Estimated Creatinine Clearance: 64.6 ml/min (by C-G formula based on Cr of 1.16).   Medications:  Infusions:    . sodium chloride 75 mL/hr at 09/15/11 0100  . heparin 1,900 Units/hr (09/15/11 1440)    Assessment: 71 yr old male with bilateral PE currently therapeutic on heparin.  Goal of Therapy:  Heparin level 0.3-0.7 units/ml Monitor platelets by anticoagulation protocol: Yes   Plan:  No change, f/u daily levels.  Rainie Crenshaw L. Illene Bolus, PharmD, BCPS Clinical Pharmacist Pager: [phone] 09/15/2011 9:23 PM

## 2011-09-15 NOTE — Progress Notes (Signed)
Patient ID: [name], male   DOB: [birth year], 71 y.o.   MRN: [id number]   Request made for therapeutic Rt thoracentesis CT 5/6 shows moderate pleural effusion  [place] of chest shows small pleural effusion today ( 5/8) Pt not symptomatic at this time  Called Dr Laneta Simmers Informed him of US findings We determined to NOT move forward with Rt thoracentesis  If pts sxs change; we would be glad to re examine pt with [place] to re evaluate effusion.

## 2011-09-15 NOTE — Progress Notes (Addendum)
[address]            [name] [id number]          [phone]          Subjective: Feeling much better, not SOB   Objective  Telemetry  AFIB/STACHY/SR  Temp:  [98.4 F (36.9 C)-99.5 F (37.5 C)] 98.6 F (37 C) (05/08 0500) Pulse Rate:  [77-109] 102  (05/08 0500) Resp:  [18-20] 18  (05/08 0500) BP: (118-144)/(67-74) 144/74 mmHg (05/08 0500) SpO2:  [94 %-97 %] 94 % (05/08 0500)   Intake/Output Summary (Last 24 hours) at 09/15/11 0816 Last data filed at 09/15/11 0500  Gross per 24 hour  Intake    963 ml  Output   1450 ml  Net   -487 ml       General appearance: alert, cooperative and no distress Heart: irregularly irregular rhythm and + SYSTOLIC MURMUR Lungs: diminished in right base Abdomen: mild distension, soft Extremities: no edema Wound: incisions all healing well  Lab Results:  Basename 09/14/11 0220 09/13/11 0635  NA 127* 129*  K 4.2 4.1  CL 97 97  CO2 21 21  GLUCOSE 119* 124*  BUN 14 13  CREATININE 1.16 1.17  CALCIUM 8.5 8.8  MG -- --  PHOS -- --   No results found for this basename: AST:2,ALT:2,ALKPHOS:2,BILITOT:2,PROT:2,ALBUMIN:2 in the last 72 hours No results found for this basename: LIPASE:2,AMYLASE:2 in the last 72 hours  Basename 09/15/11 0530 09/14/11 0955  WBC 14.5* 21.0*  NEUTROABS -- --  HGB 7.7* 9.2*  HCT 22.8* 27.0*  MCV 80.9 82.3  PLT 190 230    Basename 09/14/11 0955 09/14/11 0220 09/13/11 1828  CKTOTAL 70 64 61  CKMB 1.8 1.7 1.9  TROPONINI <0.30 <0.30 <0.30   No components found with this basename: POCBNP:3 No results found for this basename: DDIMER in the last 72 hours No results found for this basename: HGBA1C in the last 72 hours No results found for this basename: CHOL,HDL,LDLCALC,TRIG,CHOLHDL in the last 72 hours No results found for this basename: TSH,T4TOTAL,FREET3,T3FREE,THYROIDAB in the last 72 hours No results found for this basename:  VITAMINB12,FOLATE,FERRITIN,TIBC,IRON,RETICCTPCT in the last 72 hours  Medications: Scheduled    . ceFEPime (MAXIPIME) IV  1 g Intravenous Q8H  . cholecalciferol  2,000 Units Oral Daily  . fenofibrate  54 mg Oral Daily  . ferrous gluconate  324 mg Oral Q breakfast  . heparin  2,000 Units Intravenous Once  . metoprolol tartrate  25 mg Oral BID  . mulitivitamin with minerals  1 tablet Oral Daily  . polyethylene glycol  17 g Oral Daily  . potassium chloride SA  20 mEq Oral Daily  . sodium chloride  3 mL Intravenous Q12H  . Tamsulosin HCl  0.4 mg Oral Daily  . vancomycin  750 mg Intravenous Q12H  . vitamin C  500 mg Oral Daily  . DISCONTD: aspirin  325 mg Oral Daily     Radiology/Studies:  Dg Chest 2 View  09/14/2011  *RADIOLOGY REPORT*  Clinical Data: Evaluate pleural effusion.  History of pulmonary embolism.  CHEST - 2 VIEW  Comparison: Chest CT 09/13/2011 and chest radiograph 09/12/2011  Findings: Two views of the chest demonstrate a moderate sized right pleural effusion that appears to be minimally changed.  There are low lung volumes but the left lung remains clear.  Heart size is grossly stable.  Postoperative changes consistent with median sternotomy and aortic valve replacement.  There is no evidence for a pneumothorax.  Trachea is midline.  IMPRESSION: Persistent right pleural effusion.  Low lung volumes.  Original Report Authenticated By: Richarda Overlie, M.D.   Ct Angio Chest W/cm &/or Wo Cm  09/13/2011  *RADIOLOGY REPORT*  Clinical Data: Hypoxemia.  Hemoptysis.  Pulmonary embolism.  CT ANGIOGRAPHY CHEST  Technique:  Multidetector CT imaging of the chest using the standard protocol during bolus administration of intravenous contrast. Multiplanar reconstructed images including MIPs were obtained and reviewed to evaluate the vascular anatomy.  Contrast: OMNIPAQUE IOHEXOL 350 MG/ML SOLN  Comparison: Chest radiograph 09/12/2011.  Findings: Technically adequate study that is positive for  pulmonary embolism.  Pulmonary embolus is present in the descending left pulmonary artery.  The study is mildly degraded by respiratory motion artifact.  Left lower lobe posterior basal segment pulmonary embolus is also identified.  Extensive embolus extends into the right upper, right lower lobe pulmonary arteries.  Postoperative changes median sternotomy, presumably for CABG.  Small pericardial effusion.  Moderate right pleural effusion is present with associated atelectasis.  Incidental imaging the upper abdomen is within normal limits.  There are no aggressive osseous lesions identified.  Sternoclavicular joint osteoarthritis is present.  There is straightening of the interventricular septum of the heart, suggesting right heart strain.  Bilateral gynecomastia incidentally noted.  No axillary adenopathy.  Bioprosthetic aortic valve replacement noted.  IMPRESSION: 1.  Positive study for pulmonary embolus.  Bilateral lower lobe and right upper lobe pulmonary emboli are identified.  Straightening of the intraventricular septum suggest right heart strain. 2.  Moderate right pleural effusion with collapse / consolidation most promptly affecting the right lower lobe. 3.  Small pericardial effusion.  Postoperative changes of CABG and AVR.  Critical Value/emergent results were called by telephone at the time of interpretation on 09/13/2010  at 1732 hours  to  Dr. Sunnie Nielsen, who verbally acknowledged these results.  Original Report Authenticated By: Andreas Newport, M.D.    INR: Will add last result for INR, ABG once components are confirmed Will add last 4 CBG results once components are confirmed  Assessment/Plan:  1. PE, Bilat, results of venous duplex pending, on heparin gtt 2 US guided right thoracentesis pending 3 leukocytosis improved , cont abx for poss pneumonia 4 may need further management of afib. Will increase beta blocker some for now, on heparin- coumadin to be started later. 5 hyponatremia- will  place on fluid restrict   LOS: 4 days    [name] E 5/8/20138:16 AM

## 2011-09-15 NOTE — Progress Notes (Signed)
ANTICOAGULATION CONSULT NOTE   Pharmacy Consult for heparin Indication: Bilateral PE  Assessment: 71 yr old male with  bilateral PE for Heparin.  Heparin level this am was 0.28 and heparin was turned off in preparation for thoracentesis which was not done due to small amount of fluid on [place] and pt not symptomatic.  He has some epistaxis but no other bleeding noted this am. His Hg this am is 7.7.  Goal of Therapy:  Heparin level 0.3-0.7 units/ml   Plan:    Continue IV Heparin at 1900 units/hr  Check heparin level 8 hours after restart.  Talbert Cage, Ilda Basset D Clinical Pharmacist Pager:  [phone] Thank you for allowing pharmacy to be part of this patients care team.    Allergies  Allergen Reactions  . Codeine Other (See Comments)    Nervous and jittery.    Patient Measurements: Height: 5\' 10"  (177.8 cm) Weight: 196 lb 6.9 oz (89.1 kg) IBW/kg (Calculated) : 73  Heparin Dosing Weight: 89 Kg  Vital Signs: Temp: 98.6 F (37 C) (05/08 0500) BP: 144/74 mmHg (05/08 0500) Pulse Rate: 102  (05/08 0500)  Labs:  Basename 09/15/11 0530 09/14/11 1751 09/14/11 0955 09/14/11 0220 09/13/11 1828 09/13/11 0635  HGB 7.7* -- 9.2* -- -- --  HCT 22.8* -- 27.0* -- -- 29.3*  PLT 190 -- 230 -- -- 224  APTT -- -- -- -- -- --  LABPROT -- -- -- 20.6* -- --  INR -- -- -- 1.73* -- --  HEPARINUNFRC 0.28* 0.57 0.24* -- -- --  CREATININE -- -- -- 1.16 -- 1.17  CKTOTAL -- -- 70 64 61 --  CKMB -- -- 1.8 1.7 1.9 --  TROPONINI -- -- <0.30 <0.30 <0.30 --   Estimated Creatinine Clearance: 64.6 ml/min (by C-G formula based on Cr of 1.16).

## 2011-09-16 DIAGNOSIS — B9689 Other specified bacterial agents as the cause of diseases classified elsewhere: Secondary | ICD-10-CM

## 2011-09-16 DIAGNOSIS — I2699 Other pulmonary embolism without acute cor pulmonale: Secondary | ICD-10-CM

## 2011-09-16 DIAGNOSIS — J189 Pneumonia, unspecified organism: Secondary | ICD-10-CM

## 2011-09-16 DIAGNOSIS — I10 Essential (primary) hypertension: Secondary | ICD-10-CM

## 2011-09-16 DIAGNOSIS — J90 Pleural effusion, not elsewhere classified: Secondary | ICD-10-CM

## 2011-09-16 LAB — BASIC METABOLIC PANEL
Calcium: 8.6 mg/dL (ref 8.4–10.5)
Chloride: 99 mEq/L (ref 96–112)
Creatinine, Ser: 1.11 mg/dL (ref 0.50–1.35)
GFR calc Af Amer: 75 mL/min — ABNORMAL LOW (ref >90)

## 2011-09-16 LAB — CBC
MCH: 27.1 pg (ref 26.0–34.0)
Platelets: 150 10*3/uL (ref 150–400)
RBC: 3.03 MIL/uL — ABNORMAL LOW (ref 4.22–5.81)

## 2011-09-16 LAB — HEPARIN LEVEL (UNFRACTIONATED): Heparin Unfractionated: 0.32 IU/mL (ref 0.30–0.70)

## 2011-09-16 LAB — IRON AND TIBC: TIBC: 200 ug/dL — ABNORMAL LOW (ref 215–435)

## 2011-09-16 LAB — FOLATE: Folate: 12.2 ng/mL

## 2011-09-16 LAB — RETICULOCYTES: Retic Ct Pct: 1.6 % (ref 0.4–3.1)

## 2011-09-16 LAB — VITAMIN B12: Vitamin B-12: 417 pg/mL (ref 211–911)

## 2011-09-16 MED ORDER — RIVAROXABAN 15 MG PO TABS
15.0000 mg | ORAL_TABLET | Freq: Two times a day (BID) | ORAL | Status: DC
  Administered 2011-09-16 – 2011-09-20 (×9): 15 mg via ORAL
  Filled 2011-09-16 (×13): qty 1

## 2011-09-16 MED ORDER — RIVAROXABAN 10 MG PO TABS: 20.0000 mg | ORAL_TABLET | Freq: Every day | ORAL | Status: DC

## 2011-09-16 MED ORDER — METOPROLOL TARTRATE 50 MG PO TABS
50.0000 mg | ORAL_TABLET | Freq: Two times a day (BID) | ORAL | Status: DC
  Administered 2011-09-16 – 2011-09-20 (×8): 50 mg via ORAL
  Filled 2011-09-16 (×9): qty 1

## 2011-09-16 NOTE — Progress Notes (Signed)
Subjective: Patient relates feeling ok, relates some palpitation.  Objective: Filed Vitals:   09/15/11 1330 09/15/11 2043 09/15/11 2325 09/16/11 0511  BP: 130/83 150/77 120/71 154/88  Pulse: 92 109 115 101  Temp: 99 F (37.2 C) 98.9 F (37.2 C)  99.1 F (37.3 C)  TempSrc:  Oral  Oral  Resp: 18 20  18   Height:      Weight:      SpO2: 95% 92%  92%   Weight change:    General: Alert, awake, oriented x3, in no acute distress.  HEENT: No bruits, no goiter.  Heart: IRRegular rate and rhythm, without murmurs, rubs, gallops.  Lungs: Crackles bases,  bilateral air movement.  Abdomen: Soft, nontender, nondistended, positive bowel sounds.  Neuro: Grossly intact, nonfocal. Extremities; no edema.   Lab Results:  [facility] 09/16/11 0545 09/14/11 0220  NA 131* 127*  K 4.3 4.2  CL 99 97  CO2 22 21  GLUCOSE 96 119*  BUN 14 14  CREATININE 1.11 1.16  CALCIUM 8.6 8.5  MG -- --  PHOS -- --    Basename 09/16/11 0545 09/15/11 0530  WBC 12.7* 14.5*  NEUTROABS -- --  HGB 8.2* 7.7*  HCT 24.2* 22.8*  MCV 79.9 80.9  PLT 150 190    Basename 09/14/11 0955 09/14/11 0220 09/13/11 1828  CKTOTAL 70 64 61  CKMB 1.8 1.7 1.9  CKMBINDEX -- -- --  TROPONINI <0.30 <0.30 <0.30    Basename 09/16/11 0545  VITAMINB12 417  FOLATE 12.2  FERRITIN 667*  TIBC --  IRON --  RETICCTPCT 1.6    Micro Results: Recent Results (from the past 240 hour(s))  CULTURE, EXPECTORATED SPUTUM-ASSESSMENT     Status: Normal   Collection Time   09/12/11  3:58 AM      Component Value Range Status Comment   Specimen Description SPUTUM   Final    Special Requests Normal   Final    Sputum evaluation     Final    Value: MICROSCOPIC FINDINGS SUGGEST THAT THIS SPECIMEN IS NOT REPRESENTATIVE OF LOWER RESPIRATORY SECRETIONS. PLEASE RECOLLECT.     CALLED TO J.TSOUTIS,RN 0513 09/12/11 M.CAMPBELL   Report Status 09/12/2011 FINAL   Final   CULTURE, BLOOD (ROUTINE X 2)     Status: Normal (Preliminary result)   Collection  Time   09/12/11  6:50 AM      Component Value Range Status Comment   Specimen Description BLOOD RIGHT HAND   Final    Special Requests BOTTLES DRAWN AEROBIC ONLY 10CC   Final    Culture  Setup Time [id number]   Final    Culture     Final    Value:        BLOOD CULTURE RECEIVED NO GROWTH TO DATE CULTURE WILL BE HELD FOR 5 DAYS BEFORE ISSUING A FINAL NEGATIVE REPORT   Report Status PENDING   Incomplete   CULTURE, BLOOD (ROUTINE X 2)     Status: Normal (Preliminary result)   Collection Time   09/12/11  7:00 AM      Component Value Range Status Comment   Specimen Description BLOOD RIGHT ARM   Final    Special Requests BOTTLES DRAWN AEROBIC ONLY 10CC   Final    Culture  Setup Time [id number]   Final    Culture     Final    Value:        BLOOD CULTURE RECEIVED NO GROWTH TO DATE CULTURE WILL BE HELD FOR 5 DAYS BEFORE  ISSUING A FINAL NEGATIVE REPORT   Report Status PENDING   Incomplete   CULTURE, EXPECTORATED SPUTUM-ASSESSMENT     Status: Normal   Collection Time   09/12/11 10:12 PM      Component Value Range Status Comment   Specimen Description SPUTUM   Final    Special Requests NONE   Final    Sputum evaluation     Final    Value: MICROSCOPIC FINDINGS SUGGEST THAT THIS SPECIMEN IS NOT REPRESENTATIVE OF LOWER RESPIRATORY SECRETIONS. PLEASE RECOLLECT.     CALLED TO Katha Hamming RN [id number] AT [address] [facility]   Report Status 09/12/2011 FINAL   Final     Studies/Results: [place] Chest  09/15/2011  *RADIOLOGY REPORT*  Clinical Data: Right effusion.  CHEST ULTRASOUND  Comparison: None.  Findings: Ultrasound performed of the right chest demonstrates small to moderate right effusion.  Atelectatic right lung noted within the right effusion.  IMPRESSION: Small to moderate right pleural effusion.  Original Report Authenticated By: Cyndie Chime, M.D.    Medications: I have reviewed the patient's current medications.  Healthcare-associated pneumonia (09/12/2011) Continue cefepime day 5/7.    Levaquin: started 5-5 stopped 5-7  Vancomycin: started 5-5 Stopped 5-8.  Legionella antigen negative. Blood culture no growth.  WBC decrease to 12.   Bilateral Pulmonary Embolism:   ECHO negative for Right side hearth strain. Cardiac enzymes times 3 negative. Appreciate pulmonary evaluation.  transition to Rivaroxaban today.  No thoracentesis planned.  Will monitor for Hypoxemia, Hypotension.  Doppler preliminary no DVT.   Hypoxic respiratory Failure:  Secondary to PNA, pleural effusion, Pulmonary Embolism.  Continue with support care.   Hypertension  Continue with metoprolol.   Pleural effusion, bilateral (09/12/2011) No significant pleural effusion by [place].   Leucocytosis (09/12/2011) Likely related to infection. Continue with IV antibiotics.  Hyponatremia: urine osmolality elevated. Hold lasix.  Probably secondary to decrease volume but SIADH is in the differential. Improved. NSL.  Hematuria: In setting Anticoagulation, I will decrease aspirin to 81 mg. UA small amount hb. Resolved.  Anemia; Probably acute illness, hemodilution. Will monitor for bleed. Check anemia panel. Continue with ferrous sulfate.  Aortic Valve replacement// CABG * 1: ECHO valve functioning. Cardiac enzymes negative. Decrease aspirin to 81 mg due to risk for bleeding patient getting IV heparin.  A fib: Metoprolol increases today to 50 mg BID.          LOS: 5 days   Shabnam Ladd M.D.  [facility] 09/16/2011, 12:30 PM

## 2011-09-16 NOTE — Progress Notes (Addendum)
ANTICOAGULATION CONSULT NOTE - Follow Up Consult  Pharmacy Consult for heparin Indication: B PE  Allergies  Allergen Reactions  . Codeine Other (See Comments)    Nervous and jittery.    Patient Measurements: Height: 5\' 10"  (177.8 cm) Weight: 196 lb 6.9 oz (89.1 kg) IBW/kg (Calculated) : 73  Heparin Dosing Weight: 89 kg  Vital Signs: Temp: 99.1 F (37.3 C) (05/09 0511) Temp src: Oral (05/09 0511) BP: 154/88 mmHg (05/09 0511) Pulse Rate: 101  (05/09 0511)  Labs:  [name] 09/16/11 0545 09/15/11 2040 09/15/11 0530 09/14/11 0955 09/14/11 0220 09/13/11 1828  HGB 8.2* -- 7.7* -- -- --  HCT 24.2* -- 22.8* 27.0* -- --  PLT 150 -- 190 230 -- --  APTT -- -- -- -- -- --  LABPROT -- -- -- -- 20.6* --  INR -- -- -- -- 1.73* --  HEPARINUNFRC 0.32 0.42 0.28* -- -- --  CREATININE 1.11 -- -- -- 1.16 --  CKTOTAL -- -- -- 70 64 61  CKMB -- -- -- 1.8 1.7 1.9  TROPONINI -- -- -- <0.30 <0.30 <0.30    Estimated Creatinine Clearance: 67.6 ml/min (by C-G formula based on Cr of 1.11).   Medications:  Infusions:     . sodium chloride 75 mL/hr at 09/15/11 0100  . heparin 1,900 Units/hr (09/16/11 0250)    Assessment: 71 yr old male with bilateral PE currently therapeutic on heparin. Noted plan to change to xarelto.  Goal of Therapy:  Heparin level 0.3-0.7 units/ml Monitor platelets by anticoagulation protocol: Yes   Plan:  No change, f/u daily levels.  Talbert Cage, PharmD Clinical Pharmacist Pager: [phone] 09/16/2011 9:12 AM   Addum:  Change to xarelto today.  Will give 15 mg po bidwc for 21 days then 20 mg daily. Jacklyne Baik,PharmD

## 2011-09-16 NOTE — Progress Notes (Signed)
  Subjective: Feels better today.  Working on IS. Coughing a lot but not bringing up much sputum.  Objective: Vital signs in last 24 hours: Temp:  [98.7 F (37.1 C)-99.1 F (37.3 C)] 98.7 F (37.1 C) (05/09 1400) Pulse Rate:  [96-115] 96  (05/09 1400) Cardiac Rhythm:  [-] Heart block (05/09 0727) Resp:  [18-20] 18  (05/09 1400) BP: (120-154)/(71-88) 122/77 mmHg (05/09 1400) SpO2:  [92 %-96 %] 96 % (05/09 1400)  Hemodynamic parameters for last 24 hours:    Intake/Output from previous day: 05/08 0701 - 05/09 0700 In: 0  Out: 400 [Urine:400] Intake/Output this shift:    General appearance: alert and cooperative Neurologic: intact Heart: regular rate and rhythm Lungs: diminished breath sounds RLL Extremities: edema mild ankle and pedal edema bilat Wound: incision ok  Lab Results:  Basename 09/16/11 0545 09/15/11 0530  WBC 12.7* 14.5*  HGB 8.2* 7.7*  HCT 24.2* 22.8*  PLT 150 190   BMET:  Basename 09/16/11 0545 09/14/11 0220  NA 131* 127*  K 4.3 4.2  CL 99 97  CO2 22 21  GLUCOSE 96 119*  BUN 14 14  CREATININE 1.11 1.16  CALCIUM 8.6 8.5    PT/INR:  Basename 09/14/11 0220  LABPROT 20.6*  INR 1.73*   ABG    Component Value Date/Time   PHART 7.465* 09/12/2011 0454   HCO3 23.1 09/12/2011 0454   TCO2 24.1 09/12/2011 0454   ACIDBASEDEF 0.3 09/12/2011 0454   O2SAT 96.1 09/12/2011 0454   CBG (last 3)  No results found for this basename: GLUCAP:3 in the last 72 hours  Assessment/Plan: He seems to be improving after pulmonary embolism. Radiology PA did not see much effusion on the right side although the CT the day before did show moderate effusion. He doesn't have much breath sounds 1/2 way up on the right and has this non-productive cough that could be triggered by effusion and atelectasis.  Will repeat CXR in am.   LOS: 5 days    Jerrine Urschel K 09/16/2011

## 2011-09-16 NOTE — Progress Notes (Signed)
Pt and his wife stated that his "feet are swelling a lot." Upon assessment, nonpitting edema noted in ankles with mild (may +1) pitting in tops of feet.  Pt's wife stated pt was to take 4 doses of lasix at home, but was only able to take 2 prior to being admitted to hospital. Dr. Sunnie Nielsen paged. Waiting return call.

## 2011-09-17 ENCOUNTER — Inpatient Hospital Stay (HOSPITAL_COMMUNITY): Payer: Medicare Other

## 2011-09-17 DIAGNOSIS — B9689 Other specified bacterial agents as the cause of diseases classified elsewhere: Secondary | ICD-10-CM

## 2011-09-17 DIAGNOSIS — J90 Pleural effusion, not elsewhere classified: Secondary | ICD-10-CM

## 2011-09-17 DIAGNOSIS — I10 Essential (primary) hypertension: Secondary | ICD-10-CM

## 2011-09-17 DIAGNOSIS — I2699 Other pulmonary embolism without acute cor pulmonale: Secondary | ICD-10-CM

## 2011-09-17 DIAGNOSIS — J189 Pneumonia, unspecified organism: Secondary | ICD-10-CM

## 2011-09-17 LAB — BASIC METABOLIC PANEL
Chloride: 100 mEq/L (ref 96–112)
GFR calc Af Amer: 78 mL/min — ABNORMAL LOW (ref >90)
GFR calc non Af Amer: 67 mL/min — ABNORMAL LOW (ref >90)
Glucose, Bld: 99 mg/dL (ref 70–99)
Potassium: 4.3 mEq/L (ref 3.5–5.1)
Sodium: 130 mEq/L — ABNORMAL LOW (ref 135–145)

## 2011-09-17 MED ORDER — LIDOCAINE-EPINEPHRINE 1 %-1:100000 IJ SOLN
20.0000 mL | Freq: Once | INTRAMUSCULAR | Status: AC
  Administered 2011-09-18: 20 mL
  Filled 2011-09-17: qty 20

## 2011-09-17 MED ORDER — FUROSEMIDE 40 MG PO TABS
40.0000 mg | ORAL_TABLET | Freq: Every day | ORAL | Status: DC
  Administered 2011-09-17 – 2011-09-20 (×4): 40 mg via ORAL
  Filled 2011-09-17 (×4): qty 1

## 2011-09-17 MED ORDER — PANTOPRAZOLE SODIUM 40 MG PO TBEC
40.0000 mg | DELAYED_RELEASE_TABLET | Freq: Every day | ORAL | Status: DC
  Administered 2011-09-17 – 2011-09-20 (×4): 40 mg via ORAL
  Filled 2011-09-17 (×4): qty 1

## 2011-09-17 MED ORDER — DEXTROSE 5 % IV SOLN
1.0000 g | Freq: Two times a day (BID) | INTRAVENOUS | Status: AC
  Administered 2011-09-18 – 2011-09-19 (×5): 1 g via INTRAVENOUS
  Filled 2011-09-17 (×6): qty 1

## 2011-09-17 MED ORDER — GUAIFENESIN 200 MG PO TABS
400.0000 mg | ORAL_TABLET | Freq: Two times a day (BID) | ORAL | Status: DC
  Administered 2011-09-17 – 2011-09-20 (×4): 400 mg via ORAL
  Filled 2011-09-17 (×8): qty 2

## 2011-09-17 MED ORDER — ONDANSETRON HCL 4 MG/2ML IJ SOLN
4.0000 mg | Freq: Three times a day (TID) | INTRAMUSCULAR | Status: DC | PRN
  Administered 2011-09-17: 4 mg via INTRAVENOUS
  Filled 2011-09-17: qty 2

## 2011-09-17 MED ORDER — ENSURE COMPLETE PO LIQD
237.0000 mL | Freq: Two times a day (BID) | ORAL | Status: DC
  Administered 2011-09-18 – 2011-09-20 (×4): 237 mL via ORAL

## 2011-09-17 NOTE — Progress Notes (Signed)
  Subjective: Complains of shortness of breath.  Feels weak today.   Objective: Vital signs in last 24 hours: Temp:  [98.5 F (36.9 C)-98.8 F (37.1 C)] 98.5 F (36.9 C) (05/10 0600) Pulse Rate:  [94-118] 118  (05/10 1032) Cardiac Rhythm:  [-] Atrial fibrillation (05/10 0831) Resp:  [18-20] 18  (05/10 0600) BP: (115-147)/(73-81) 115/73 mmHg (05/10 1032) SpO2:  [97 %-98 %] 98 % (05/10 1032)  Hemodynamic parameters for last 24 hours:    Intake/Output from previous day:   Intake/Output this shift: Total I/O In: 780 [P.O.:780] Out: -   General appearance: alert and fatigued Heart: regular rate and rhythm Lungs: diminished breath sounds RLL Extremities: edema mild in ankles and feet.  Lab Results:  Basename 09/16/11 0545 09/15/11 0530  WBC 12.7* 14.5*  HGB 8.2* 7.7*  HCT 24.2* 22.8*  PLT 150 190   BMET:  Basename 09/17/11 0615 09/16/11 0545  NA 130* 131*  K 4.3 4.3  CL 100 99  CO2 20 22  GLUCOSE 99 96  BUN 12 14  CREATININE 1.07 1.11  CALCIUM 8.9 8.6    PT/INR: No results found for this basename: LABPROT,INR in the last 72 hours ABG    Component Value Date/Time   PHART 7.465* 09/12/2011 0454   HCO3 23.1 09/12/2011 0454   TCO2 24.1 09/12/2011 0454   ACIDBASEDEF 0.3 09/12/2011 0454   O2SAT 96.1 09/12/2011 0454   CBG (last 3)  No results found for this basename: GLUCAP:3 in the last 72 hours  CXR:  Reviewed by me. There appears to be at least moderated right pleural effusion that looks a little larger than on last cxr on 5/5. There is significant atelectasis or consolidation of the RLL.  Assessment/Plan: His breathing is not improving and I think it is likely due to the right pleural effusion and its effect on the right lower lobe.  I think he needs right thoracentesis. I will plan to do this myself in the am.  His risk of bleeding is increased due to Rivaroxaban but I think this needs to be done.  I discussed the procedure, risks including bleeding, need for blood  transfusion, chest tube or surgery if he has significant pleural bleeding, infection, recurrence of the effusion, and the possibility that I may not be able to completely drain this by thoracentesis. He and wife understand and agree to proceed.   LOS: 6 days    Jazsmin Couse K 09/17/2011

## 2011-09-17 NOTE — Progress Notes (Signed)
Subjective: Patient sitting in chair, appears comfortable. He relates that dyspnea is not better. He denies worsening SOB. Relates dry cough now. Objective: Filed Vitals:   09/16/11 2100 09/16/11 2228 09/17/11 0600 09/17/11 1032  BP: 147/75 140/81 118/73 115/73  Pulse: 105 103 94 118  Temp: 98.8 F (37.1 C)  98.5 F (36.9 C)   TempSrc: Oral  Oral   Resp: 20  18   Height:      Weight:      SpO2: 97%  97% 98%   Weight change:    General: Alert, awake, oriented x3, in no acute distress.  HEENT: No bruits, no goiter.  Heart: Regular rate and rhythm, without murmurs, rubs, gallops.  Lungs: Crackles bases, bilateral air movement.  Abdomen: Soft, nontender, nondistended, positive bowel sounds.  Neuro: Grossly intact, nonfocal. Extremities; Trace edema.   Lab Results:  [facility] 09/17/11 0615 09/16/11 0545  NA 130* 131*  K 4.3 4.3  CL 100 99  CO2 20 22  GLUCOSE 99 96  BUN 12 14  CREATININE 1.07 1.11  CALCIUM 8.9 8.6  MG -- --  PHOS -- --    Basename 09/16/11 0545 09/15/11 0530  WBC 12.7* 14.5*  NEUTROABS -- --  HGB 8.2* 7.7*  HCT 24.2* 22.8*  MCV 79.9 80.9  PLT 150 190    Basename 09/16/11 0545  VITAMINB12 417  FOLATE 12.2  FERRITIN 667*  TIBC 200*  IRON 15*  RETICCTPCT 1.6    Micro Results: Recent Results (from the past 240 hour(s))  CULTURE, EXPECTORATED SPUTUM-ASSESSMENT     Status: Normal   Collection Time   09/12/11  3:58 AM      Component Value Range Status Comment   Specimen Description SPUTUM   Final    Special Requests Normal   Final    Sputum evaluation     Final    Value: MICROSCOPIC FINDINGS SUGGEST THAT THIS SPECIMEN IS NOT REPRESENTATIVE OF LOWER RESPIRATORY SECRETIONS. PLEASE RECOLLECT.     CALLED TO J.TSOUTIS,RN 0513 09/12/11 M.CAMPBELL   Report Status 09/12/2011 FINAL   Final   CULTURE, BLOOD (ROUTINE X 2)     Status: Normal (Preliminary result)   Collection Time   09/12/11  6:50 AM      Component Value Range Status Comment   Specimen  Description BLOOD RIGHT HAND   Final    Special Requests BOTTLES DRAWN AEROBIC ONLY 10CC   Final    Culture  Setup Time [id number]   Final    Culture     Final    Value:        BLOOD CULTURE RECEIVED NO GROWTH TO DATE CULTURE WILL BE HELD FOR 5 DAYS BEFORE ISSUING A FINAL NEGATIVE REPORT   Report Status PENDING   Incomplete   CULTURE, BLOOD (ROUTINE X 2)     Status: Normal (Preliminary result)   Collection Time   09/12/11  7:00 AM      Component Value Range Status Comment   Specimen Description BLOOD RIGHT ARM   Final    Special Requests BOTTLES DRAWN AEROBIC ONLY 10CC   Final    Culture  Setup Time [id number]   Final    Culture     Final    Value:        BLOOD CULTURE RECEIVED NO GROWTH TO DATE CULTURE WILL BE HELD FOR 5 DAYS BEFORE ISSUING A FINAL NEGATIVE REPORT   Report Status PENDING   Incomplete   CULTURE, EXPECTORATED SPUTUM-ASSESSMENT  Status: Normal   Collection Time   09/12/11 10:12 PM      Component Value Range Status Comment   Specimen Description SPUTUM   Final    Special Requests NONE   Final    Sputum evaluation     Final    Value: MICROSCOPIC FINDINGS SUGGEST THAT THIS SPECIMEN IS NOT REPRESENTATIVE OF LOWER RESPIRATORY SECRETIONS. PLEASE RECOLLECT.     CALLED TO Katha Hamming RN [id number] AT [address], S   Report Status 09/12/2011 FINAL   Final     Studies/Results: No results found.  Medications: I have reviewed the patient's current medications.  A 71 year old gentleman status post recent open heart surgery who was discharged from the hospital postoperatively only 3 days prior to admission presents on 5-5  with generalized weakness, shortness of breath and hypoxia. He was found to have Bilateral Pulmonary Embolism, Health Care associated PNA, Right pleural effusion. Dr Laneta Simmers and pulmonary consulted they help with management. Pulmonary recommend Xarelto for treatment of Pulmonary Embolism.  Ct scan show Right side heart strain but ECHO was negative.  Patient was initially on heparin. He was suppose to have right thoracentesis but [place] didn't show significant pleural effusion.    Healthcare-associated pneumonia (09/12/2011) Continue cefepime day 6/7.  Levaquin: started 5-5 stopped 5-7  Vancomycin: started 5-5 Stopped 5-8.  Legionella antigen negative. Blood culture no growth.  WBC decreased. Repeat in AM.   Bilateral Pulmonary Embolism:  ECHO negative for Right side hearth strain. Cardiac enzymes times 3 negative. Appreciate pulmonary evaluation. transition to Rivaroxaban on 5-9.  No thoracentesis planned.  Will monitor for Hypoxemia, Hypotension.  Doppler preliminary no DVT.   Hypoxic respiratory Failure:  Secondary to PNA, pleural effusion, Pulmonary Embolism.  Continue with support care. Restart lasix today.   Hypertension  Continue with metoprolol.   Pleural effusion, bilateral (09/12/2011) No significant pleural effusion by [place]. Repeat Chest x ray. Dr Laneta Simmers following.   Leucocytosis (09/12/2011) Likely related to infection. Continue with IV antibiotics.  Hyponatremia: urine osmolality elevated. Improved. Probably secondary to decrease volume but SIADH is in the differential. Improved. NSL. Restart lasix today. Repeat B-met am.  Hematuria: In setting Anticoagulation, I will decrease aspirin to 81 mg. UA small amount hb. Resolved.  Anemia; Probably acute illness.  Will monitor for bleed. Anemia panel: Iron 15, ferritin 667, folate 12, B-12 417. Continue with ferrous sulfate. CBC in am. Aortic Valve replacement// CABG * 1: ECHO valve functioning. Cardiac enzymes negative. Decrease aspirin to 81 mg due to risk for bleeding patient getting Xarelto. A fib: Metoprolol increases  to 50 mg BID on 5-9.            LOS: 6 days   Keny Donald M.D.  [facility] 09/17/2011, 1:01 PM

## 2011-09-17 NOTE — Progress Notes (Signed)
Patient c/o SOB, anxiety and difficulty catching breath.  HR in the mid-80's and O2 sats 98% on 2L.  MD notified. New orders received.  Will continue to monitor. Nolon Nations

## 2011-09-17 NOTE — Progress Notes (Signed)
PA with [facility] notified of CXR results and patient's dyspnea per hospitalist's request.  Will continue to monitor. Nolon Nations

## 2011-09-17 NOTE — Progress Notes (Signed)
Utilization review completed.  

## 2011-09-17 NOTE — Evaluation (Signed)
Physical Therapy Evaluation Patient Details Name: [name] MRN: [id number] DOB: [birth year] Today's Date: 09/17/2011 Time: 2130-8657 PT Time Calculation (min): 25 min  PT Assessment / Plan / Recommendation Clinical Impression  pt admitted for PE and PNA.  Mobility limited by decreased activity tolerance and general deconditioning.  Rec HHPT after D/C.    PT Assessment  Patient needs continued PT services    Follow Up Recommendations  Home health PT    Barriers to Discharge        lEquipment Recommendations  Defer to next venue    Recommendations for Other Services     Frequency Min 3X/week    Precautions / Restrictions Precautions Precautions: Sternal Restrictions Weight Bearing Restrictions: No   Pertinent Vitals/Pain       Mobility  Bed Mobility Bed Mobility: Supine to Sit Supine to Sit: 5: Supervision Details for Bed Mobility Assistance: safety only Transfers Transfers: Sit to Stand;Stand to Sit Sit to Stand: 5: Supervision Stand to Sit: 5: Supervision Details for Transfer Assistance: follows good safety principles and sternal precautions Ambulation/Gait Ambulation/Gait Assistance: 5: Supervision Ambulation Distance (Feet): 270 Feet Assistive device: None Ambulation/Gait Assistance Details: generally steady, but guarded and stiff Gait Pattern: Step-through pattern;Decreased stride length Stairs: No    Exercises     PT Diagnosis: Generalized weakness  PT Problem List: Decreased strength;Decreased activity tolerance;Decreased mobility PT Treatment Interventions: Gait training;Stair training;Functional mobility training;Therapeutic activities;Therapeutic exercise;Balance training;Patient/family education   PT Goals Acute Rehab PT Goals PT Goal Formulation: With patient Time For Goal Achievement: 09/24/11 Potential to Achieve Goals: Good Pt will go Supine/Side to Sit: Independently;with HOB 0 degrees PT Goal: Supine/Side to Sit - Progress: Goal set  today Pt will go Sit to Stand: with modified independence PT Goal: Sit to Stand - Progress: Goal set today Pt will Transfer Bed to Chair/Chair to Bed: with modified independence PT Transfer Goal: Bed to Chair/Chair to Bed - Progress: Goal set today Pt will Ambulate: >150 feet;with modified independence;with least restrictive assistive device PT Goal: Ambulate - Progress: Goal set today Pt will Go Up / Down Stairs: 3-5 stairs;with supervision PT Goal: Up/Down Stairs - Progress: Goal set today  Visit Information  Last PT Received On: 09/17/11 Assistance Needed: +1    Subjective Data  Patient Stated Goal: Home Independent   Prior Functioning  Home Living Lives With: Spouse Type of Home: House Home Access: Stairs to enter Secretary/administrator of Steps: 4 Entrance Stairs-Rails: None Home Layout: One level;Able to live on main level with bedroom/bathroom;Laundry or work area in basement [employer] Shower/Tub: [employer];Door [employer] Toilet: Standard Home Adaptive Equipment: Walker - rolling;Straight cane;Shower chair with back Prior Function Level of Independence: Independent Able to Take Stairs?: Yes Driving: Yes Communication Communication: No difficulties    Cognition  Arousal/Alertness: Awake/alert Orientation Level: Appears intact for tasks assessed Behavior During Session: [facility] for tasks performed    Extremity/Trunk Assessment Right Lower Extremity Assessment RLE ROM/Strength/Tone: Within functional levels Left Lower Extremity Assessment LLE ROM/Strength/Tone: Within functional levels (Bil general weaknes, but >4/5)   Balance Balance Balance Assessed: No  End of Session PT - End of Session Activity Tolerance: Patient tolerated treatment well Patient left: in chair;with call bell/phone within reach;with family/visitor present Nurse Communication: Mobility status   Gladie Gravette, Eliseo Gum 09/17/2011, 4:01 PM  09/17/2011  Bogart Bing,  PT [phone] (pager)

## 2011-09-17 NOTE — Progress Notes (Signed)
ANTIBIOTIC CONSULT NOTE - FOLLOW UP  Pharmacy Consult for Cefepime Indication: pneumonia  Allergies  Allergen Reactions  . Codeine Other (See Comments)    Nervous and jittery.    Patient Measurements: Height: 5\' 10"  (177.8 cm) Weight: 196 lb 6.9 oz (89.1 kg) IBW/kg (Calculated) : 73   Vital Signs: Temp: 98.5 F (36.9 C) (05/10 0600) Temp src: Oral (05/10 0600) BP: 115/73 mmHg (05/10 1032) Pulse Rate: 118  (05/10 1032) Intake/Output from previous day:   Intake/Output from this shift: Total I/O In: 720 [P.O.:720] Out: -   Labs:  Basename 09/17/11 0615 09/16/11 0545 09/15/11 0530  WBC -- 12.7* 14.5*  HGB -- 8.2* 7.7*  PLT -- 150 190  LABCREA -- -- --  CREATININE 1.07 1.11 --   Estimated Creatinine Clearance: 70.1 ml/min (by C-G formula based on Cr of 1.07). No results found for this basename: VANCOTROUGH:2,VANCOPEAK:2,VANCORANDOM:2,GENTTROUGH:2,GENTPEAK:2,GENTRANDOM:2,TOBRATROUGH:2,TOBRAPEAK:2,TOBRARND:2,AMIKACINPEAK:2,AMIKACINTROU:2,AMIKACIN:2, in the last 72 hours   Microbiology: Recent Results (from the past 720 hour(s))  SURGICAL PCR SCREEN     Status: Normal   Collection Time   08/30/11 11:37 AM      Component Value Range Status Comment   MRSA, PCR NEGATIVE  NEGATIVE  Final    Staphylococcus aureus NEGATIVE  NEGATIVE  Final   CULTURE, EXPECTORATED SPUTUM-ASSESSMENT     Status: Normal   Collection Time   09/12/11  3:58 AM      Component Value Range Status Comment   Specimen Description SPUTUM   Final    Special Requests Normal   Final    Sputum evaluation     Final    Value: MICROSCOPIC FINDINGS SUGGEST THAT THIS SPECIMEN IS NOT REPRESENTATIVE OF LOWER RESPIRATORY SECRETIONS. PLEASE RECOLLECT.     CALLED TO J.TSOUTIS,RN 0513 09/12/11 M.CAMPBELL   Report Status 09/12/2011 FINAL   Final   CULTURE, BLOOD (ROUTINE X 2)     Status: Normal (Preliminary result)   Collection Time   09/12/11  6:50 AM      Component Value Range Status Comment   Specimen Description  BLOOD RIGHT HAND   Final    Special Requests BOTTLES DRAWN AEROBIC ONLY 10CC   Final    Culture  Setup Time [id number]   Final    Culture     Final    Value:        BLOOD CULTURE RECEIVED NO GROWTH TO DATE CULTURE WILL BE HELD FOR 5 DAYS BEFORE ISSUING A FINAL NEGATIVE REPORT   Report Status PENDING   Incomplete   CULTURE, BLOOD (ROUTINE X 2)     Status: Normal (Preliminary result)   Collection Time   09/12/11  7:00 AM      Component Value Range Status Comment   Specimen Description BLOOD RIGHT ARM   Final    Special Requests BOTTLES DRAWN AEROBIC ONLY 10CC   Final    Culture  Setup Time [id number]   Final    Culture     Final    Value:        BLOOD CULTURE RECEIVED NO GROWTH TO DATE CULTURE WILL BE HELD FOR 5 DAYS BEFORE ISSUING A FINAL NEGATIVE REPORT   Report Status PENDING   Incomplete   CULTURE, EXPECTORATED SPUTUM-ASSESSMENT     Status: Normal   Collection Time   09/12/11 10:12 PM      Component Value Range Status Comment   Specimen Description SPUTUM   Final    Special Requests NONE   Final    Sputum evaluation  Final    Value: MICROSCOPIC FINDINGS SUGGEST THAT THIS SPECIMEN IS NOT REPRESENTATIVE OF LOWER RESPIRATORY SECRETIONS. PLEASE RECOLLECT.     CALLED TO Katha Hamming RN [id number] AT [address] [facility], S   Report Status 09/12/2011 FINAL   Final     Anti-infectives     Start     Dose/Rate Route Frequency Ordered Stop   09/12/11 0330   ceFEPIme (MAXIPIME) 1 g in dextrose 5 % 50 mL IVPB        1 g 100 mL/hr over 30 Minutes Intravenous 3 times per day 09/12/11 0224 09/20/11 0559   09/12/11 0330   levofloxacin (LEVAQUIN) IVPB 750 mg        750 mg 100 mL/hr over 90 Minutes Intravenous Every 24 hours 09/12/11 0224 09/14/11 0552   09/12/11 0330   vancomycin (VANCOCIN) 750 mg in sodium chloride 0.9 % 150 mL IVPB  Status:  Discontinued        750 mg 150 mL/hr over 60 Minutes Intravenous Every 12 hours 09/12/11 0243 09/15/11 1004   09/11/11 2215   moxifloxacin (AVELOX)  IVPB 400 mg        400 mg 250 mL/hr over 60 Minutes Intravenous  Once 09/11/11 2214 09/11/11 2325   09/11/11 0000   moxifloxacin (AVELOX) 400 MG tablet        400 mg Oral Daily 09/11/11 2325 09/21/11 2359          Admit Complaint: 71 y.o.  male  admitted 09/11/2011 with new onset DOE, nonproductive cough, chest discomfort. He c/o shaking, but denies fever or chills. Found on CXR to have a new right sided effusion with some consolidation in the right base and elevated WBC. Pharmacy consulted to dose antibiotics  Assessment:  Anticoagulation: PE:  Hg 5/8 was 7.7, last 8.2. Had some blood in urine previously but no further hemoptysis, small amt nose bleed noted 5/8 Heparin change to xarelto 5/9   >>15 mg bidwc x 21 days then 20 mg daily.  Follow periodic CBC, no bleeding noted. Infectious Disease: Empiric Pneumonia: afebrile WBC trend down. Plan Cefepime until 5/13 Cultures 5/5 Bcx>>ngtd Sputum cx>> non acceptable Antibiotics vanc 5/5>>5/8 cefipime 5/5>> levoflox 5/5>>5/7  Cardiovascular: Recent AVR/single vessel CABG. Asa,metop,fenofibrate : BP at goal, tachycardic, noted to have irregular HR this am.  GI/GU: Urinary retention: tamsulosin Nephrology/Fluid/Electrolytes: SCr stable: MVI, K 20 daily (K=4.3),  Na low, K was 4.3 Pulmonary: Following pleural effusion by CXR and CT.   Hematology / Oncology: Anemia: CBC stable, ferrous sulfate, vitamin D PTA Medication Issues: Home Meds Not Ordered: lovaza, tramadol Best Practices: DVT Prophylaxis:  rivaroxaban  Goal of Therapy:  Renal adjustment of antibiotic dosing.  Plan:  Change Cefepime to 1g IV q12h, as clinically improving and no incremental benefit of q8h dosing. Follow up SCr, UOP, cultures, clinical course and adjust as clinically indicated.   Thank you for allowing pharmacy to be a part of this patients care team.  Lovenia Kim Pharm.D., BCPS Clinical Pharmacist 09/17/2011 11:13 AM Pager: [phone] Phone: [phone]

## 2011-09-18 ENCOUNTER — Inpatient Hospital Stay (HOSPITAL_COMMUNITY): Payer: Medicare Other

## 2011-09-18 DIAGNOSIS — J90 Pleural effusion, not elsewhere classified: Secondary | ICD-10-CM

## 2011-09-18 DIAGNOSIS — B9689 Other specified bacterial agents as the cause of diseases classified elsewhere: Secondary | ICD-10-CM

## 2011-09-18 DIAGNOSIS — J189 Pneumonia, unspecified organism: Secondary | ICD-10-CM

## 2011-09-18 DIAGNOSIS — I10 Essential (primary) hypertension: Secondary | ICD-10-CM

## 2011-09-18 DIAGNOSIS — E871 Hypo-osmolality and hyponatremia: Secondary | ICD-10-CM | POA: Diagnosis present

## 2011-09-18 DIAGNOSIS — I2699 Other pulmonary embolism without acute cor pulmonale: Secondary | ICD-10-CM

## 2011-09-18 LAB — CULTURE, BLOOD (ROUTINE X 2)
Culture  Setup Time: 201305051053
Culture: NO GROWTH

## 2011-09-18 LAB — BASIC METABOLIC PANEL
CO2: 24 mEq/L (ref 19–32)
Glucose, Bld: 105 mg/dL — ABNORMAL HIGH (ref 70–99)
Potassium: 4.1 mEq/L (ref 3.5–5.1)
Sodium: 132 mEq/L — ABNORMAL LOW (ref 135–145)

## 2011-09-18 LAB — CBC
Hemoglobin: 8.3 g/dL — ABNORMAL LOW (ref 13.0–17.0)
MCH: 27.4 pg (ref 26.0–34.0)
RBC: 3.03 MIL/uL — ABNORMAL LOW (ref 4.22–5.81)

## 2011-09-18 MED ORDER — BISACODYL 10 MG RE SUPP: 10.0000 mg | Freq: Every day | RECTAL | Status: DC | PRN

## 2011-09-18 NOTE — CV Procedure (Signed)
Right Thoracentesis  Informed consent obtained Sterile prep and drape. 1% lidocaine with epi  10cc  550cc old serosanguinous fluid removed.  Tolerated well CXR ordered.

## 2011-09-18 NOTE — Progress Notes (Signed)
Patient ID: [name], male   DOB: 01-Dec-1940, 71 y.o.   MRN: [id number]  [facility] Progress Note  09/18/2011   Subjective: Pt sitting up in chair eating lunch, reporting persistent SOB, tolerated thoracentesis well  Objective:  Vital signs in last 24 hours: Filed Vitals:   09/17/11 1637 09/17/11 2232 09/18/11 0041 09/18/11 0546  BP:  119/77 129/64 105/68  Pulse:  90 99 89  Temp:  98.6 F (37 C)  98.2 F (36.8 C)  TempSrc:  Oral  Oral  Resp:  18  20  Height:      Weight:      SpO2: 98% 98%  98%   Weight change:   Intake/Output Summary (Last 24 hours) at 09/18/11 1218 Last data filed at 09/17/11 1300  Gross per 24 hour  Intake     60 ml  Output      0 ml  Net     60 ml   Lab Results  Component Value Date   HGBA1C 6.2* 08/30/2011   Lab Results  Component Value Date   LDLCALC 56 09/05/2011   CREATININE 1.17 09/18/2011    Review of Systems As above, otherwise all reviewed and reported negative  Physical Exam General - awake, no distress, cooperative HEENT - NCAT, MMM Lungs - BBS shallow CV - normal s1, s2 sounds with murmur Abd - soft, nondistended, no masses, nontender Ext - no clubbing or cyanosis  Lab Results: Results for orders placed during the hospital encounter of 09/11/11 (from the past 24 hour(s))  CBC     Status: Abnormal   Collection Time   09/18/11  6:10 AM      Component Value Range   WBC 8.7  4.0 - 10.5 (K/uL)   RBC 3.03 (*) 4.22 - 5.81 (MIL/uL)   Hemoglobin 8.3 (*) 13.0 - 17.0 (g/dL)   HCT 62.1 (*) 30.8 - 52.0 (%)   MCV 81.5  78.0 - 100.0 (fL)   MCH 27.4  26.0 - 34.0 (pg)   MCHC 33.6  30.0 - 36.0 (g/dL)   RDW 65.7  84.6 - 96.2 (%)   Platelets 144 (*) 150 - 400 (K/uL)  BASIC METABOLIC PANEL     Status: Abnormal   Collection Time   09/18/11  6:10 AM      Component Value Range   Sodium 132 (*) 135 - 145 (mEq/L)   Potassium 4.1  3.5 - 5.1 (mEq/L)   Chloride 98  96 - 112 (mEq/L)   CO2 24  19 - 32 (mEq/L)   Glucose, Bld 105 (*)  70 - 99 (mg/dL)   BUN 13  6 - 23 (mg/dL)   Creatinine, Ser 9.52  0.50 - 1.35 (mg/dL)   Calcium 8.9  8.4 - 84.1 (mg/dL)   GFR calc non Af Amer 60 (*) >90 (mL/min)   GFR calc Af Amer 70 (*) >90 (mL/min)    Micro Results: Recent Results (from the past 240 hour(s))  CULTURE, EXPECTORATED SPUTUM-ASSESSMENT     Status: Normal   Collection Time   09/12/11  3:58 AM      Component Value Range Status Comment   Specimen Description SPUTUM   Final    Special Requests Normal   Final    Sputum evaluation     Final    Value: MICROSCOPIC FINDINGS SUGGEST THAT THIS SPECIMEN IS NOT REPRESENTATIVE OF LOWER RESPIRATORY SECRETIONS. PLEASE RECOLLECT.     CALLED TO J.TSOUTIS,RN 0513 09/12/11 M.CAMPBELL   Report Status 09/12/2011 FINAL  Final   CULTURE, BLOOD (ROUTINE X 2)     Status: Normal   Collection Time   09/12/11  6:50 AM      Component Value Range Status Comment   Specimen Description BLOOD RIGHT HAND   Final    Special Requests BOTTLES DRAWN AEROBIC ONLY 10CC   Final    Culture  Setup Time [id number]   Final    Culture NO GROWTH 5 DAYS   Final    Report Status 09/18/2011 FINAL   Final   CULTURE, BLOOD (ROUTINE X 2)     Status: Normal   Collection Time   09/12/11  7:00 AM      Component Value Range Status Comment   Specimen Description BLOOD RIGHT ARM   Final    Special Requests BOTTLES DRAWN AEROBIC ONLY 10CC   Final    Culture  Setup Time [id number]   Final    Culture NO GROWTH 5 DAYS   Final    Report Status 09/18/2011 FINAL   Final   CULTURE, EXPECTORATED SPUTUM-ASSESSMENT     Status: Normal   Collection Time   09/12/11 10:12 PM      Component Value Range Status Comment   Specimen Description SPUTUM   Final    Special Requests NONE   Final    Sputum evaluation     Final    Value: MICROSCOPIC FINDINGS SUGGEST THAT THIS SPECIMEN IS NOT REPRESENTATIVE OF LOWER RESPIRATORY SECRETIONS. PLEASE RECOLLECT.     CALLED TO Katha Hamming RN [phone] AT [address], S   Report Status  09/12/2011 FINAL   Final     Medications:  Scheduled Meds:   . ceFEPime (MAXIPIME) IV  1 g Intravenous Q12H  . cholecalciferol  2,000 Units Oral Daily  . feeding supplement  237 mL Oral BID BM  . fenofibrate  54 mg Oral Daily  . ferrous gluconate  324 mg Oral Q breakfast  . furosemide  40 mg Oral Daily  . guaiFENesin  400 mg Oral BID  . lidocaine-EPINEPHrine  20 mL Infiltration Once  . metoprolol tartrate  50 mg Oral BID  . mulitivitamin with minerals  1 tablet Oral Daily  . pantoprazole  40 mg Oral Q1200  . polyethylene glycol  17 g Oral Daily  . potassium chloride SA  20 mEq Oral Daily  . rivaroxaban  20 mg Oral Q breakfast  . rivaroxaban  15 mg Oral BID WC  . sodium chloride  3 mL Intravenous Q12H  . Tamsulosin HCl  0.4 mg Oral Daily  . vitamin C  500 mg Oral Daily   Continuous Infusions:  PRN Meds:.sodium chloride, bisacodyl, guaiFENesin, ondansetron (ZOFRAN) IV, polyvinyl alcohol, sodium chloride, sodium chloride, traMADol  Assessment/Plan: Healthcare-associated pneumonia (09/12/2011) Continue cefepime day 7/7.  Levaquin: started 5-5 stopped 5-7  Vancomycin: started 5-5 Stopped 5-8.  Legionella antigen negative. Blood culture no growth.  WBC decreased. Repeat in AM.  Change to oral antibiotics tomorrow.   Bilateral Pulmonary Embolism:  ECHO negative for Right side hearth strain. Cardiac enzymes times 3 negative. Appreciate pulmonary evaluation. transition to Rivaroxaban on 5-9.  No thoracentesis planned.  Will monitor for Hypoxemia, Hypotension.  Doppler preliminary no DVT.   Hypoxic respiratory Failure:  Secondary to PNA, pleural effusion, Pulmonary Embolism.  Continue with support care. Restart lasix today.   Hypertension  Continue with metoprolol.   Pleural effusion, bilateral (09/12/2011) No significant pleural effusion by [place]. Repeat Chest x ray. Dr Laneta Simmers following. S/p thoracentesis this  morning.    Leukocytosis (09/12/2011) Resolved now.  Likely related to  infection. Continue with IV antibiotics.   Hyponatremia: improving, urine osmolality elevated. Likely secondary to SIADH. Improved. NSL. Restart lasix today. Repeat B-met am.   Hematuria: In setting Anticoagulation, I will decrease aspirin to 81 mg. UA small amount hb. Resolved.   Anemia; Probably acute illness. Will monitor for bleed. Anemia panel: Iron 15, ferritin 667, folate 12, B-12 417. Continue with ferrous sulfate. CBC in am.   Aortic Valve replacement// CABG * 1: ECHO valve functioning. Cardiac enzymes negative. Decrease aspirin to 81 mg due to risk for bleeding patient getting Xarelto.   A fib: Metoprolol increases to 50 mg BID on 5-9.    LOS: 7 days   Kelcy Baeten 09/18/2011, 12:18 PM   Cleora Fleet, MD, CDE, FAAFP [facility] [place], [place]  [phone]

## 2011-09-19 ENCOUNTER — Inpatient Hospital Stay (HOSPITAL_COMMUNITY): Payer: Medicare Other

## 2011-09-19 DIAGNOSIS — I2699 Other pulmonary embolism without acute cor pulmonale: Secondary | ICD-10-CM

## 2011-09-19 DIAGNOSIS — B9689 Other specified bacterial agents as the cause of diseases classified elsewhere: Secondary | ICD-10-CM

## 2011-09-19 DIAGNOSIS — J189 Pneumonia, unspecified organism: Secondary | ICD-10-CM

## 2011-09-19 DIAGNOSIS — I10 Essential (primary) hypertension: Secondary | ICD-10-CM

## 2011-09-19 DIAGNOSIS — J90 Pleural effusion, not elsewhere classified: Secondary | ICD-10-CM

## 2011-09-19 LAB — BASIC METABOLIC PANEL
BUN: 15 mg/dL (ref 6–23)
CO2: 25 mEq/L (ref 19–32)
Chloride: 98 mEq/L (ref 96–112)
Glucose, Bld: 95 mg/dL (ref 70–99)
Potassium: 4.2 mEq/L (ref 3.5–5.1)

## 2011-09-19 LAB — CBC
HCT: 25.5 % — ABNORMAL LOW (ref 39.0–52.0)
Hemoglobin: 8.4 g/dL — ABNORMAL LOW (ref 13.0–17.0)
MCH: 26.7 pg (ref 26.0–34.0)
MCHC: 32.9 g/dL (ref 30.0–36.0)

## 2011-09-19 NOTE — Progress Notes (Signed)
  Subjective: He says his breathing is better  Objective: Vital signs in last 24 hours: Temp:  [98.3 F (36.8 C)-98.4 F (36.9 C)] 98.3 F (36.8 C) (05/12 0500) Pulse Rate:  [76-99] 89  (05/12 0500) Cardiac Rhythm:  [-] Normal sinus rhythm;Heart block (05/12 0743) Resp:  [18-20] 18  (05/12 0500) BP: (103-110)/(66-69) 109/68 mmHg (05/12 0500) SpO2:  [94 %-96 %] 94 % (05/12 0500)  Hemodynamic parameters for last 24 hours:    Intake/Output from previous day: 05/11 0701 - 05/12 0700 In: -  Out: 325 [Urine:325] Intake/Output this shift:    General appearance: alert and cooperative Heart: regular rate and rhythm, S1, S2 normal Lungs: diminished breath sounds RLL and egophony RLL Extremities: edema mild Wound: incision ok  Lab Results:  Basename 09/19/11 0705 09/18/11 0610  WBC 8.7 8.7  HGB 8.4* 8.3*  HCT 25.5* 24.7*  PLT 163 144*   BMET:  Basename 09/19/11 0705 09/18/11 0610  NA 133* 132*  K 4.2 4.1  CL 98 98  CO2 25 24  GLUCOSE 95 105*  BUN 15 13  CREATININE 1.14 1.17  CALCIUM 9.3 8.9    PT/INR: No results found for this basename: LABPROT,INR in the last 72 hours ABG    Component Value Date/Time   PHART 7.465* 09/12/2011 0454   HCO3 23.1 09/12/2011 0454   TCO2 24.1 09/12/2011 0454   ACIDBASEDEF 0.3 09/12/2011 0454   O2SAT 96.1 09/12/2011 0454   CBG (last 3)  No results found for this basename: GLUCAP:3 in the last 72 hours   CXR:  The is still significant density in the right lower hemithorax. Assessment/Plan: I can't tell if the residual density in the right lower hemithorax is due to significant residual pleural effusion that I could not drain with thoracentesis or if this is consolidated/atelectatic lung. I think it would be useful to repeat CT chest without contrast to evaluate this completely. Discussed with patient and family and they agree.  Will order.   LOS: 8 days    Cymone Yeske K 09/19/2011

## 2011-09-19 NOTE — Progress Notes (Signed)
[facility] Progress Note  09/19/2011   Subjective: Pt feeling better.  Much less SOB with ambulation. He is getting up and ambulating in room.  He is using incentive spirometry.    Objective:  Vital signs in last 24 hours: Filed Vitals:   09/18/11 0546 09/18/11 1431 09/18/11 2100 09/19/11 0500  BP: 105/68 103/66 110/69 109/68  Pulse: 89 76 99 89  Temp: 98.2 F (36.8 C)  98.4 F (36.9 C) 98.3 F (36.8 C)  TempSrc: Oral     Resp: 20 20 18 18   Height:      Weight:      SpO2: 98% 96% 95% 94%   Weight change:   Intake/Output Summary (Last 24 hours) at 09/19/11 0916 Last data filed at 09/19/11 0500  Gross per 24 hour  Intake      0 ml  Output    325 ml  Net   -325 ml   Lab Results  Component Value Date   HGBA1C 6.2* 08/30/2011   Lab Results  Component Value Date   LDLCALC 56 09/05/2011   CREATININE 1.14 09/19/2011    Review of Systems As above, otherwise all reviewed and reported negative  Physical Exam General - awake, no distress, cooperative  HEENT - NCAT, MMM Lungs - BBS  CV - normal s1, s2 sounds with murmur  Abd - soft, nondistended, no masses, nontender  Ext - no clubbing or cyanosis  Lab Results: Results for orders placed during the hospital encounter of 09/11/11 (from the past 24 hour(s))  BASIC METABOLIC PANEL     Status: Abnormal   Collection Time   09/19/11  7:05 AM      Component Value Range   Sodium 133 (*) 135 - 145 (mEq/L)   Potassium 4.2  3.5 - 5.1 (mEq/L)   Chloride 98  96 - 112 (mEq/L)   CO2 25  19 - 32 (mEq/L)   Glucose, Bld 95  70 - 99 (mg/dL)   BUN 15  6 - 23 (mg/dL)   Creatinine, Ser 1.61  0.50 - 1.35 (mg/dL)   Calcium 9.3  8.4 - 09.6 (mg/dL)   GFR calc non Af Amer 62 (*) >90 (mL/min)   GFR calc Af Amer 72 (*) >90 (mL/min)  CBC     Status: Abnormal   Collection Time   09/19/11  7:05 AM      Component Value Range   WBC 8.7  4.0 - 10.5 (K/uL)   RBC 3.15 (*) 4.22 - 5.81 (MIL/uL)   Hemoglobin 8.4 (*) 13.0 - 17.0 (g/dL)   HCT  04.5 (*) 40.9 - 52.0 (%)   MCV 81.0  78.0 - 100.0 (fL)   MCH 26.7  26.0 - 34.0 (pg)   MCHC 32.9  30.0 - 36.0 (g/dL)   RDW 81.1  91.4 - 78.2 (%)   Platelets 163  150 - 400 (K/uL)    Micro Results: Recent Results (from the past 240 hour(s))  CULTURE, EXPECTORATED SPUTUM-ASSESSMENT     Status: Normal   Collection Time   09/12/11  3:58 AM      Component Value Range Status Comment   Specimen Description SPUTUM   Final    Special Requests Normal   Final    Sputum evaluation     Final    Value: MICROSCOPIC FINDINGS SUGGEST THAT THIS SPECIMEN IS NOT REPRESENTATIVE OF LOWER RESPIRATORY SECRETIONS. PLEASE RECOLLECT.     CALLED TO J.TSOUTIS,RN 0513 09/12/11 M.CAMPBELL   Report Status 09/12/2011 FINAL  Final   CULTURE, BLOOD (ROUTINE X 2)     Status: Normal   Collection Time   09/12/11  6:50 AM      Component Value Range Status Comment   Specimen Description BLOOD RIGHT HAND   Final    Special Requests BOTTLES DRAWN AEROBIC ONLY 10CC   Final    Culture  Setup Time [id number]   Final    Culture NO GROWTH 5 DAYS   Final    Report Status 09/18/2011 FINAL   Final   CULTURE, BLOOD (ROUTINE X 2)     Status: Normal   Collection Time   09/12/11  7:00 AM      Component Value Range Status Comment   Specimen Description BLOOD RIGHT ARM   Final    Special Requests BOTTLES DRAWN AEROBIC ONLY 10CC   Final    Culture  Setup Time [id number]   Final    Culture NO GROWTH 5 DAYS   Final    Report Status 09/18/2011 FINAL   Final   CULTURE, EXPECTORATED SPUTUM-ASSESSMENT     Status: Normal   Collection Time   09/12/11 10:12 PM      Component Value Range Status Comment   Specimen Description SPUTUM   Final    Special Requests NONE   Final    Sputum evaluation     Final    Value: MICROSCOPIC FINDINGS SUGGEST THAT THIS SPECIMEN IS NOT REPRESENTATIVE OF LOWER RESPIRATORY SECRETIONS. PLEASE RECOLLECT.     CALLED TO Katha Hamming RN [phone] AT [address], S   Report Status 09/12/2011 FINAL   Final      Medications:  Scheduled Meds:   . ceFEPime (MAXIPIME) IV  1 g Intravenous Q12H  . cholecalciferol  2,000 Units Oral Daily  . feeding supplement  237 mL Oral BID BM  . fenofibrate  54 mg Oral Daily  . ferrous gluconate  324 mg Oral Q breakfast  . furosemide  40 mg Oral Daily  . guaiFENesin  400 mg Oral BID  . metoprolol tartrate  50 mg Oral BID  . mulitivitamin with minerals  1 tablet Oral Daily  . pantoprazole  40 mg Oral Q1200  . polyethylene glycol  17 g Oral Daily  . potassium chloride SA  20 mEq Oral Daily  . rivaroxaban  20 mg Oral Q breakfast  . rivaroxaban  15 mg Oral BID WC  . sodium chloride  3 mL Intravenous Q12H  . Tamsulosin HCl  0.4 mg Oral Daily  . vitamin C  500 mg Oral Daily   Continuous Infusions:  PRN Meds:.sodium chloride, bisacodyl, guaiFENesin, ondansetron (ZOFRAN) IV, polyvinyl alcohol, sodium chloride, sodium chloride, traMADol  Assessment/Plan: Healthcare-associated pneumonia (09/12/2011) Complete cefepime 7 day IV antibiotic treatment course after final dose today Levaquin: started 5-5 stopped 5-7  Vancomycin: started 5-5 Stopped 5-8.  Legionella antigen negative. Blood culture no growth.  WBC decreased.    Bilateral Pulmonary Embolism:  ECHO negative for Right side hearth strain. Cardiac enzymes times 3 negative. Appreciate pulmonary evaluation. transitioned to Rivaroxaban on 5-9.  No thoracentesis planned.  Will monitor for Hypoxemia, Hypotension.  Doppler preliminary no DVT.   Hypoxic respiratory Failure:  Secondary to PNA, pleural effusion, Pulmonary Embolism.  Continue with support care. Restart lasix today.  SOB improving daily.   Hypertension - controlled Continue with metoprolol.   Pleural effusion, bilateral (09/12/2011) No significant pleural effusion by [place]. Repeat Chest x ray today. Dr Laneta Simmers following. S/p thoracentesis on 5/11.  Leukocytosis (09/12/2011) Resolved now. Likely related to infection. Finished IV antibiotics.   Hyponatremia: improving, urine osmolality elevated. Likely secondary to SIADH. Improved. NSL. Restart lasix today. Repeat B-met am.  Hematuria: In setting Anticoagulation, I will decrease aspirin to 81 mg. UA small amount hb. Resolved.  Anemia; Probably acute illness. Will monitor for bleed. Anemia panel: Iron 15, ferritin 667, folate 12, B-12 417. Continue with ferrous sulfate. CBC in am.  Aortic Valve replacement// CABG * 1: ECHO valve functioning. Cardiac enzymes negative. Decrease aspirin to 81 mg due to risk for bleeding patient getting Xarelto.  A fib: Metoprolol increased to 50 mg BID on 5-9.    LOS: 8 days   Arie Powell 09/19/2011, 9:16 AM   Cleora Fleet, MD, CDE, FAAFP [facility] [place], [place]  [phone]

## 2011-09-20 DIAGNOSIS — R0600 Dyspnea, unspecified: Secondary | ICD-10-CM | POA: Diagnosis present

## 2011-09-20 DIAGNOSIS — J189 Pneumonia, unspecified organism: Secondary | ICD-10-CM

## 2011-09-20 DIAGNOSIS — J90 Pleural effusion, not elsewhere classified: Secondary | ICD-10-CM

## 2011-09-20 DIAGNOSIS — R600 Localized edema: Secondary | ICD-10-CM | POA: Diagnosis present

## 2011-09-20 DIAGNOSIS — Z952 Presence of prosthetic heart valve: Secondary | ICD-10-CM

## 2011-09-20 DIAGNOSIS — I10 Essential (primary) hypertension: Secondary | ICD-10-CM

## 2011-09-20 DIAGNOSIS — I2699 Other pulmonary embolism without acute cor pulmonale: Secondary | ICD-10-CM

## 2011-09-20 DIAGNOSIS — B9689 Other specified bacterial agents as the cause of diseases classified elsewhere: Secondary | ICD-10-CM

## 2011-09-20 MED ORDER — SACCHAROMYCES BOULARDII 250 MG PO CAPS: 250.0000 mg | ORAL_CAPSULE | Freq: Two times a day (BID) | ORAL | Status: AC

## 2011-09-20 MED ORDER — FUROSEMIDE 40 MG PO TABS: 40.0000 mg | ORAL_TABLET | Freq: Every day | ORAL | Status: DC

## 2011-09-20 MED ORDER — AMOXICILLIN-POT CLAVULANATE 875-125 MG PO TABS: 1.0000 | ORAL_TABLET | Freq: Two times a day (BID) | ORAL | Status: DC

## 2011-09-20 MED ORDER — RIVAROXABAN 15 MG PO TABS: 15.0000 mg | ORAL_TABLET | Freq: Two times a day (BID) | ORAL | Status: DC

## 2011-09-20 MED ORDER — SACCHAROMYCES BOULARDII 250 MG PO CAPS
250.0000 mg | ORAL_CAPSULE | Freq: Two times a day (BID) | ORAL | Status: DC
Start: 2011-09-20 — End: 2011-09-20
  Administered 2011-09-20: 250 mg via ORAL
  Filled 2011-09-20 (×2): qty 1

## 2011-09-20 MED ORDER — PANTOPRAZOLE SODIUM 40 MG PO TBEC: 40.0000 mg | DELAYED_RELEASE_TABLET | Freq: Every day | ORAL | Status: DC

## 2011-09-20 MED ORDER — TRAMADOL HCL 50 MG PO TABS: 50.0000 mg | ORAL_TABLET | Freq: Four times a day (QID) | ORAL | Status: DC | PRN

## 2011-09-20 MED ORDER — AMOXICILLIN-POT CLAVULANATE 875-125 MG PO TABS
1.0000 | ORAL_TABLET | Freq: Two times a day (BID) | ORAL | Status: DC
  Administered 2011-09-20: 1 via ORAL
  Filled 2011-09-20 (×2): qty 1

## 2011-09-20 MED ORDER — POTASSIUM CHLORIDE CRYS ER 20 MEQ PO TBCR: 20.0000 meq | EXTENDED_RELEASE_TABLET | Freq: Every day | ORAL | Status: DC

## 2011-09-20 MED ORDER — ENSURE COMPLETE PO LIQD: 237.0000 mL | Freq: Two times a day (BID) | ORAL | Status: DC

## 2011-09-20 NOTE — Progress Notes (Signed)
[facility] Progress Note  09/20/2011   Subjective: Pt clinically improving, says he is feeling better, ambulating and using incentive spirometry with much less SOB  Objective:  Vital signs in last 24 hours: Filed Vitals:   09/19/11 0500 09/19/11 2100 09/20/11 0500 09/20/11 0940  BP: 109/68 116/65 106/68 113/60  Pulse: 89 97 85 91  Temp: 98.3 F (36.8 C) 98.1 F (36.7 C) 98.6 F (37 C)   TempSrc:      Resp: 18 18 16    Height:      Weight:      SpO2: 94% 96% 95%    Weight change:   Intake/Output Summary (Last 24 hours) at 09/20/11 1103 Last data filed at 09/20/11 0756  Gross per 24 hour  Intake    240 ml  Output    275 ml  Net    -35 ml   Lab Results  Component Value Date   HGBA1C 6.2* 08/30/2011   Lab Results  Component Value Date   LDLCALC 56 09/05/2011   CREATININE 1.14 09/19/2011    Review of Systems As above, otherwise all reviewed and reported negative  Physical Exam General - awake, no distress, cooperative  HEENT - NCAT, MMM Lungs - BBS with decreased BS RLL posteriorly CV - normal s1, s2 sounds with murmur  Abd - soft, nondistended, no masses, nontender  Ext - no clubbing or cyanosis Neuro - nonfocal  Lab Results: No results found for this or any previous visit (from the past 24 hour(s)).  Micro Results: Recent Results (from the past 240 hour(s))  CULTURE, EXPECTORATED SPUTUM-ASSESSMENT     Status: Normal   Collection Time   09/12/11  3:58 AM      Component Value Range Status Comment   Specimen Description SPUTUM   Final    Special Requests Normal   Final    Sputum evaluation     Final    Value: MICROSCOPIC FINDINGS SUGGEST THAT THIS SPECIMEN IS NOT REPRESENTATIVE OF LOWER RESPIRATORY SECRETIONS. PLEASE RECOLLECT.     CALLED TO J.TSOUTIS,RN 0513 09/12/11 M.CAMPBELL   Report Status 09/12/2011 FINAL   Final   CULTURE, BLOOD (ROUTINE X 2)     Status: Normal   Collection Time   09/12/11  6:50 AM      Component Value Range Status Comment   Specimen Description BLOOD RIGHT HAND   Final    Special Requests BOTTLES DRAWN AEROBIC ONLY 10CC   Final    Culture  Setup Time [id number]   Final    Culture NO GROWTH 5 DAYS   Final    Report Status 09/18/2011 FINAL   Final   CULTURE, BLOOD (ROUTINE X 2)     Status: Normal   Collection Time   09/12/11  7:00 AM      Component Value Range Status Comment   Specimen Description BLOOD RIGHT ARM   Final    Special Requests BOTTLES DRAWN AEROBIC ONLY 10CC   Final    Culture  Setup Time [id number]   Final    Culture NO GROWTH 5 DAYS   Final    Report Status 09/18/2011 FINAL   Final   CULTURE, EXPECTORATED SPUTUM-ASSESSMENT     Status: Normal   Collection Time   09/12/11 10:12 PM      Component Value Range Status Comment   Specimen Description SPUTUM   Final    Special Requests NONE   Final    Sputum evaluation     Final  Value: MICROSCOPIC FINDINGS SUGGEST THAT THIS SPECIMEN IS NOT REPRESENTATIVE OF LOWER RESPIRATORY SECRETIONS. PLEASE RECOLLECT.     CALLED TO Katha Hamming RN [phone] AT 2247 [facility], S   Report Status 09/12/2011 FINAL   Final     Medications:  Scheduled Meds:   . amoxicillin-clavulanate  1 tablet Oral Q12H  . ceFEPime (MAXIPIME) IV  1 g Intravenous Q12H  . cholecalciferol  2,000 Units Oral Daily  . feeding supplement  237 mL Oral BID BM  . fenofibrate  54 mg Oral Daily  . ferrous gluconate  324 mg Oral Q breakfast  . furosemide  40 mg Oral Daily  . guaiFENesin  400 mg Oral BID  . metoprolol tartrate  50 mg Oral BID  . mulitivitamin with minerals  1 tablet Oral Daily  . pantoprazole  40 mg Oral Q1200  . polyethylene glycol  17 g Oral Daily  . potassium chloride SA  20 mEq Oral Daily  . rivaroxaban  20 mg Oral Q breakfast  . rivaroxaban  15 mg Oral BID WC  . saccharomyces boulardii  250 mg Oral BID  . sodium chloride  3 mL Intravenous Q12H  . Tamsulosin HCl  0.4 mg Oral Daily  . vitamin C  500 mg Oral Daily   Continuous Infusions:  PRN Meds:.sodium  chloride, bisacodyl, guaiFENesin, ondansetron (ZOFRAN) IV, polyvinyl alcohol, sodium chloride, sodium chloride, traMADol  Assessment/Plan: Healthcare-associated pneumonia (09/12/2011) Completed cefepime 7 day IV antibiotic treatment course  Starting Augmentin 875 mg po BID plus florastor 250 mg po bid today Levaquin: started 5-5 stopped 5-7  Vancomycin: started 5-5 Stopped 5-8.  Legionella antigen negative. Blood culture no growth.  WBC decreased. CT lungs 5/12 - c/w pneumonia RLL  Pt clinically improving.  Bilateral Pulmonary Embolism:  ECHO negative for Right side hearth strain. Cardiac enzymes times 3 negative. Appreciate pulmonary evaluation. transitioned to Rivaroxaban on 5-9.  No thoracentesis planned.  Will monitor for Hypoxemia, Hypotension.  Doppler preliminary no DVT.  Hypoxic respiratory Failure:  SOB improving daily Multifactorial Secondary to PNA, pleural effusion, Pulmonary Embolism.  Continue with support care.  .  Hypertension - controlled  Continue with metoprolol.   Pleural effusion, bilateral (09/12/2011) No significant pleural effusion by [place]. Repeat Chest x ray today. Dr Laneta Simmers following. S/p thoracentesis on 5/11.   Leukocytosis (09/12/2011) Resolved now. Likely related to infection. Finishing course of antibiotics.   Hyponatremia: improving, urine osmolality elevated. Likely secondary to SIADH. Improved.  Repeat B-met am.   Hematuria: In setting Anticoagulation, I will decrease aspirin to 81 mg. UA small amount hb. Resolved.   Anemia; Probably acute illness. Will monitor for bleed. Anemia panel: Iron 15, ferritin 667, folate 12, B-12 417. Continue with ferrous sulfate. CBC in am.   Aortic Valve replacement// CABG * 1: ECHO valve functioning. Cardiac enzymes negative. Decrease aspirin to 81 mg due to risk for bleeding patient getting Xarelto.   A fib: rate controlled, Metoprolol increased to 50 mg BID on 5-9.   Dispo: home when cleared by CT surgery.    LOS:  9 days   Asees Manfredi 09/20/2011, 11:03 AM   Cleora Fleet, MD, CDE, FAAFP [facility] [place], [place]  [phone]

## 2011-09-20 NOTE — Progress Notes (Signed)
Home health choices offered included  HOME HEALTH AGENCIES SERVING [place]   Agencies that are Medicare-Certified and are affiliated with [facility]  Telephone Number Address  [facility]   [facility] has ownership interest in this company; however, you are under no obligation to use this agency. [phone] or  [phone] [address] [place], [place] [address]   Agencies that are Medicare-Certified and are not affiliated with [facility] Telephone Number Address  [facility] [phone] Fax [phone] [address], [address] [place], [place]  [address]  [facility] [phone] or [phone] Fax [phone] [address] [place], [place] [address]  [facility] [phone] Fax [phone] [address] [place], [place] [address]  [facility] [phone] Fax [phone] [address], [address] [place], [place]  [address]  [facility] [phone] Fax [phone] [address], Suite [place], [place] [address]  [facility] [phone] [address] [place], [place] [address]  [facility] [phone]  [address] Suite [place], [place] [address]  [facility] [phone] or [phone] Fax [phone] [address], [address] [place], [place]  [address]  [facility] [phone] Fax [phone] [address] [place], [place]  [address]  [facility]  [phone] Fax [phone] [address] [place], [place] [address]

## 2011-09-20 NOTE — Progress Notes (Signed)
CARE MANAGEMENT NOTE 09/20/2011  Patient:  [name], [name]   Account Number:  [id number]  Date Initiated:  09/13/2011  Documentation initiated by:  Darlyne Russian  Subjective/Objective Assessment:   Patient admitted with HCAP     Action/Plan:   Progression of care and discharge planning   Anticipated DC Date:  09/17/2011   Anticipated DC Plan:  HOME W HOME HEALTH SERVICES      DC Planning Services  CM consult      [facility] Choice  HOME HEALTH   Choice offered to / List presented to:  [name]        HH arranged  HH-2 PT      [facility] agency  [facility]   Status of service:  Completed, signed off Medicare Important Message given?   (If response is "NO", the following Medicare IM given date fields will be blank) Date Medicare IM given:   Date Additional Medicare IM given:    Discharge Disposition:  HOME W HOME HEALTH SERVICES  Per UR Regulation:  Reviewed for med. necessity/level of care/duration of stay  If discussed at Long Length of Stay Meetings, dates discussed:    Comments:  09/20/11 1919 Clinton Gallant, RN, CCM [phone] Received call from Shanda Bumps, [place] stating there are orders to d/c pt with PT recommendations for [facility] PT No DME needs Orders entered in EPIC and to [facility] to [facility]) at [phone] CM spoke with Tamela Oddi at [facility] at [phone] tor provider verbal referral pending fax information  09-17-11 [address] [place] [phone] Benefits check 40.00 for xarelto.  09-17-11 [address] Tomi Bamberger, [place] [phone] Benefits check in process for xarelto. Will make pt aware once completed. Pt will need resumption orders for [facility] RN. Will continue to monitor.  09/13/11 Onnie Boer, RN, BSN 1612 PT IS CURRENTLY HERE WITH PNA, PT WAS CURRENTLY ACTIVE WITH [facility] WITH AN RN AND A RW.  WILL F/U ON DC NEEDS.  09/13/2011 [address] RN, [place]  [phone] Utilization review completed.  Patient dishcarged home 09/06/11 s/p CABG  on 09/02/2011 for follow with MD office. On 09/12/11 he has increased weakness,shortness of breath and  elevated temp 101, in the ER POX i80% on RA.

## 2011-09-20 NOTE — Progress Notes (Signed)
Collaborated with and agree with the note.  09/20/2011  [place] Bing, PT [phone] (pager)

## 2011-09-20 NOTE — Discharge Instructions (Addendum)
Pleural Effusion  The lining covering your lungs and the inside of your chest is called the pleura. Usually, the space between the 2 pleura contains no air and only a thin layer of fluid. A pleural effusion is an abnormal buildup of fluid in the pleural space.  Fluid gathers when there is increased pressure in the lung vessels. This forces fluids out of the lungs and into the pleural space. Vessels may also leak fluids when there are infections, such as pneumonia, or other causes of soreness and redness (inflammation). Fluids leak into the lungs when protein in the blood is low or when certain vessels (lymphatics) are blocked.  Finding a pleural effusion is important because it is usually caused by another disease. In order to treat a pleural effusion, your caregiver needs to find its cause. If left untreated, a large amount of fluid can build up and cause collapse of the lung.  CAUSES    Heart failure.   Infections (pneumonia, tuberculosis), pulmonary embolism, pulmonary infarction.   Cancer (primary lung and metastatic), asbestosis.   Liver failure (cirrhosis).   Nephrotic syndrome, peritoneal dialysis, kidney problems (uremia).   Collagen vascular disease (systemic lupus erythematosis, rheumatoid arthritis).   Injury (trauma) to the chest or rupture of the digestive tube (esophagus).   Material in the chest or pleural space (hemothorax, chylothorax).   Pancreatitis.   Surgery.   Drug reactions.  SYMPTOMS   A pleural effusion can decrease the amount of space available for breathing and make you short of breath. The fluid can become infected, which may cause pain and fever. Often, the pain is worse when taking a deep breath. The underlying disease (heart failure, pneumonia, blood clot, tuberculosis, cancer) may also cause symptoms.  DIAGNOSIS    Your caregiver can usually tell what is wrong by talking to you (taking a history), doing an exam, and taking a routine X-ray. If the X-ray shows fluid in your  chest, often fluid is removed from your chest with a needle for testing (diagnostic thoracentesis).   Sometimes, more specialized X-rays may be needed.   Sometimes, a small piece of tissue is removed and examined by a specialist (biopsy).  TREATMENT   Treatment varies based on what caused the pleural effusion. Treatments include:   Removing as much fluid as possible using a needle (thoracentesis) to improve the cough and shortness of breath. This is a simple procedure which can be done at bedside. The risks are bleeding, infection, collapse of a lung, or low blood pressure.   Placing a tube in the chest to drain the effusion (tube thoracostomy). This is often used when there is an infection in the fluid. This is a simple procedure which can often be done at bedside or in a clinic. The procedure may be painful. The risks are the same as using a needle to drain the fluid. The chest tube usually remains for a few days and is connected to suction to improve fluid drainage. The tube, after placement, usually does not cause much discomfort.   Surgical removal of fibrous debris in and around the pleural space (decortication). This may be done with a flexible telescope (thoracoscope) through a small or large cut (incision). This is helpful for patients who have fibrosis or scar tissue that prevents complete lung expansion. The risks are infection, blood loss, and side effects from general anesthesia.   Sometimes, a procedure called pleurodesis is done. A chest tube is placed and the fluid is drained. Next, an agent (  causes the lung and chest wall to stick together (adhesion). This leaves no potential space for fluid to build up. The risks include infection, blood loss, and side effects from general anesthesia.   If the effusion is caused by infection, it may be treated with antibiotics and improve without draining.  HOME CARE  INSTRUCTIONS   Take any medicines exactly as prescribed.   Follow up with your caregiver as directed.   Monitor your exercise capacity (the amount of walking you can do before you get short of breath).   Do not smoke. Ask your caregiver for help quitting.  SEEK MEDICAL CARE IF:   Your exercise capacity seems to get worse or does not improve with time.   You do not recover from your illness.  SEEK IMMEDIATE MEDICAL CARE IF:   Shortness of breath or chest pain develops or gets worse.   You have an oral temperature above 102 F (38.9 C), not controlled by medicine.   You develop a new cough, especially if the mucus (phlegm) is discolored.  MAKE SURE YOU:   Understand these instructions.   Will watch your condition.   Will get help right away if you are not doing well or get worse.  Document Released: 04/26/2005 Document Revised: 04/15/2011 Document Reviewed: 12/16/2006     ExitCare Patient Information 2012 ExitCare, [place] oral tablets What is this medicine? RIVAROXABAN (ri va ROX a ban) is an anticoagulant (blood thinner). It is used after knee or hip surgeries to prevent blood clotting. It is also used to lower the chance of stroke in people with a medical condition called atrial fibrillation. This medicine may be used for other purposes; ask your health care provider or pharmacist if you have questions. What should I tell my health care provider before I take this medicine? They need to know if you have any of these conditions: -bleeding disorders -bleeding in the brain -blood in your stools (black or tarry stools) or if you have blood in your vomit -history of stomach bleeding -kidney disease -liver disease -low blood counts, like low white cell, platelet, or red cell counts -recent or planned spinal or epidural procedure -take medicines that treat or prevent blood clots -an unusual or allergic reaction to rivaroxaban, other medicines, foods, dyes, or  preservatives -pregnant or trying to get pregnant -breast-feeding How should I use this medicine? Take this medicine by mouth with a glass of water. Follow the directions on the prescription label. Take your medicine at regular intervals. Do not take it more often than directed. Do not stop taking except on your doctor's advice. Talk to your pediatrician regarding the use of this medicine in children. Special care may be needed. Overdosage: If you think you've taken too much of this medicine contact a poison control center or emergency room at once. Overdosage: If you think you have taken too much of this medicine contact a poison control center or emergency room at once. NOTE: This medicine is only for you. Do not share this medicine with others. What if I miss a dose? If you miss a dose, take it as soon as you can. If it is almost time for your next dose, take only that dose. Do not take double or extra doses. What may interact with this medicine? -aspirin and aspirin-like medicines -certain antibiotics like erythromycin, azithromycin, and clarithromycin -certain medicines for fungal infections like ketoconazole and itraconazole -certain medicines for irregular heart beat like amiodarone, quinidine, dronedarone -certain medicines for seizures like carbamazepine,  phenytoin -certain medicines that treat or prevent blood clots like warfarin, enoxaparin, and dalteparin  -conivaptan -diltiazem -felodipine -indinavir -lopinavir; ritonavir -NSAIDS, medicines for pain and inflammation, like ibuprofen or naproxen -ranolazine -rifampin -ritonavir -St. John's wort -verapamil This list may not describe all possible interactions. Give your health care provider a list of all the medicines, herbs, non-prescription drugs, or dietary supplements you use. Also tell them if you smoke, drink alcohol, or use illegal drugs. Some items may interact with your medicine. What should I watch for while using  this medicine? This medicine may increase your risk to bruise or bleed. Call your doctor or health care professional if you notice any unusual bleeding. Be careful brushing and flossing your teeth or using a toothpick because you may bleed more easily. If you have any dental work done, tell your dentist you are receiving this medicine. What side effects may I notice from receiving this medicine? Side effects that you should report to your doctor or health care professional as soon as possible: -allergic reactions like skin rash, itching or hives, swelling of the face, lips, or tongue -bloody or black, tarry stools -changes in vision -confusion, trouble speaking or understanding -red or dark-brown urine -redness, blistering, peeling or loosening of the skin, including inside the mouth -severe headaches -spitting up blood or brown material that looks like coffee grounds -sudden numbness or weakness of the face, arm or leg -trouble walking, dizziness, loss of balance or coordination -unusual bruising or bleeding from the eye, gums, or nose  Side effects that usually do not require medical attention (Report these to your doctor or health care professional if they continue or are bothersome.): -dizziness -muscle pain This list may not describe all possible side effects. Call your doctor for medical advice about side effects. You may report side effects to FDA at 1-800-FDA-1088. Where should I keep my medicine? Keep out of the reach of children. Store at room temperature between 15 and 30 degrees C (59 and 86 degrees F). Throw away any unused medicine after the expiration date. NOTE: This sheet is a summary. It may not cover all possible information. If you have questions about this medicine, talk to your doctor, pharmacist, or health care provider.  2012, [employer]. (03/17/2010 8:48:10 AM).   Cardiac Diet This diet can help prevent heart disease and stroke. Many factors influence  your heart health, including eating and exercise habits. Coronary risk rises a lot with abnormal blood fat (lipid) levels. Cardiac meal planning includes limiting unhealthy fats, increasing healthy fats, and making other small dietary changes. General guidelines are as follows:  Adjust calorie intake to reach and maintain desirable body weight.   Limit total fat intake to less than 30% of total calories. Saturated fat should be less than 7% of calories.   Saturated fats are found in animal products and in some vegetable products. Saturated vegetable fats are found in coconut oil, cocoa butter, palm oil, and palm kernel oil. Read labels carefully to avoid these products as much as possible. Use butter in moderation. Choose tub margarines and oils that have 2 grams of fat or less. Good cooking oils are canola and olive oils.   Practice low-fat cooking techniques. Do not fry food. Instead, broil, bake, boil, steam, grill, roast on a rack, stir-fry, or microwave it. Other fat reducing suggestions include:   Remove the skin from poultry.   Remove all visible fat from meats.   Skim the fat off stews, soups, and gravies before  serving them.   Steam vegetables in water or broth instead of sauting them in fat.   Avoid foods with trans fat (or hydrogenated oils), such as commercially fried foods and commercially baked goods. Commercial shortening and deep-frying fats will contain trans fat.   Increase intake of fruits, vegetables, whole grains, and legumes to replace foods high in fat.   Increase consumption of nuts, legumes, and seeds to at least 4 servings weekly. One serving of a legume equals  cup, and 1 serving of nuts or seeds equals  cup.   Choose whole grains more often. Have 3 servings per day (a serving is 1 ounce [oz]).   Have at least 4 cups of fruit and vegetable a day.   Increase your intake of soluble fiber to 10 to 25 grams per day. Soluble fiber binds cholesterol to be removed  from the blood. Foods high in soluble fiber are dried beans, citrus fruits, oats, apples, bananas, broccoli, Brussels sprouts, and eggplant.   Try to include foods fortified with plant sterols or stanols, such as yogurt, breads, juices, or margarines. Choose several fortified foods to achieve a daily intake of 2 to 3 grams of plant sterols or stanols.   Foods with omega-3 fats can help reduce your risk of heart disease. Aim to have a 3.5 oz portion of fatty fish twice per week, such as salmon, mackerel, albacore tuna, sardines, lake trout, or herring. If you wish to take a fish oil supplement, choose one that contains 1 gram of both DHA and EPA.   Limit processed meats to 2 servings (3 oz portion) weekly.   Limit the sodium in your diet to 1500 milligrams (mg) per day. If you have high blood pressure, talk to a registered dietitian about a DASH (Dietary Approaches to Stop Hypertension) eating plan.   Limit beverages with added sugar, such as soda, to no more than 36 ounces per week.  CHOOSING FOODS Starches  Allowed: Breads: All kinds (wheat, rye, raisin, white, oatmeal, [place], [place], and English muffin bread). Low-fat rolls: English muffins, frankfurter and hamburger buns, bagels, pita bread, tortillas (not fried). Pancakes, waffles, biscuits, and muffins made with recommended oil.   Avoid: Products made with saturated or trans fats, oils, or whole milk products. Butter rolls, cheese breads, croissants. Commercial doughnuts, muffins, sweet rolls, biscuits, waffles, pancakes, store-bought mixes.  Crackers  Allowed: Low-fat crackers and snacks: Animal, graham, rye, saltine (with recommended oil, no lard), oyster, and matzo crackers. Bread sticks, melba toast, rusks, flatbread, pretzels, and light popcorn.   Avoid: High-fat crackers: cheese crackers, butter crackers, and those made with coconut, palm oil, or trans fat (hydrogenated oils). Buttered popcorn.  Cereals  Allowed: Hot or cold  whole-grain cereals.   Avoid: Cereals containing coconut, hydrogenated vegetable fat, or animal fat.  Potatoes / Pasta / Rice  Allowed: All kinds of potatoes, rice, and pasta (such as macaroni, spaghetti, and noodles).   Avoid: Pasta or rice prepared with cream sauce or high-fat cheese. Chow mein noodles, [place] fries.  Vegetables  Allowed: All vegetables and vegetable juices.   Avoid: Fried vegetables. Vegetables in cream, butter, or high-fat cheese sauces. Limit coconut. Fruit in cream or custard.  Meat and Meat Substitutes  Allowed: Limit your intake of meat, seafood, and poultry to no more than 6 oz (cooked weight) per day. All lean, well-trimmed beef, veal, pork, and lamb. All chicken and [place] without skin. All fish and shellfish. Wild game: wild duck, rabbit, pheasant, and venison. Meatless dishes:  recipes with dried beans, peas, lentils, and tofu (soybean curd). Seeds and nuts: all seeds and most nuts.   Avoid: Prime grade and other heavily marbled and fatty meats, such as short ribs, spare ribs, rib eye roast or steak, frankfurters, sausage, bacon, and high-fat luncheon meats, mutton. Caviar. Commercially fried fish. Domestic duck, goose, venison sausage. Organ meats: liver, gizzard, heart, chitterlings, brains, kidney, sweetbreads.  Dairy  Allowed: Egg whites or low-cholesterol egg substitutes may be used as desired. Low-fat cheeses: nonfat or low-fat cottage cheese (1% or 2% fat), cheeses made with part skim milk, such as mozzarella, farmers, string, or ricotta. (Cheeses should be labeled no more than 2 to 6 grams fat per oz.)   Avoid: Whole milk cheeses, including colby, cheddar, [place], [address], [place], [place], [place], [address], Swiss, and blue. Creamed cottage cheese, cream cheese.  Milk  Allowed: Skim (or 1%) milk: liquid, powdered, or evaporated. Buttermilk made with low-fat milk. Drinks made with skim or low-fat milk or cocoa. Chocolate milk or cocoa made with  skim or low-fat (1%) milk. Nonfat or low-fat yogurt.   Avoid: Whole milk and whole milk products, including buttermilk or yogurt made from whole milk, drinks made from whole milk. Condensed milk, evaporated whole milk, and 2% milk.  Soups and Combination Foods  Allowed: Low-fat low-sodium soups: broth, dehydrated soups, homemade broth, soups with the fat removed, homemade cream soups made with skim or low-fat milk. Low-fat spaghetti, lasagna, chili, and Spanish rice if low-fat ingredients and low-fat cooking techniques are used.   Avoid: Cream soups made with whole milk, cream, or high-fat cheese. All other soups.  Desserts and Sweets  Allowed: Sherbet, fruit ices, gelatins, meringues, and angel food cake. Homemade desserts with recommended fats, oils, and milk products. Jam, jelly, honey, marmalade, sugars, and syrups. Pure sugar candy, such as gum drops, hard candy, jelly beans, marshmallows, mints, and small amounts of dark chocolate.   Avoid: Commercially prepared cakes, pies, cookies, frosting, pudding, or mixes for these products. Desserts containing whole milk products, chocolate, coconut, lard, palm oil, or palm kernel oil. Ice cream or ice cream drinks. Candy that contains chocolate, coconut, butter, hydrogenated fat, or unknown ingredients. Buttered syrups.  Fats and Oils  Allowed: Vegetable oils: safflower, sunflower, corn, soybean, cottonseed, sesame, canola, olive, or peanut. Non-hydrogenated margarines. Salad dressing or mayonnaise: homemade or commercial, made with a recommended oil. Low or nonfat salad dressing or mayonnaise.   Limit added fats and oils to 6 to 8 tsp per day (includes fats used in cooking, baking, salads, and spreads on bread). Remember to count the "hidden fats" in foods.   Avoid: Solid fats and shortenings: butter, lard, salt pork, bacon drippings. Gravy containing meat fat, shortening, or suet. Cocoa butter, coconut. Coconut oil, palm oil, palm kernel oil, or  hydrogenated oils: these ingredients are often used in bakery products, nondairy creamers, whipped toppings, candy, and commercially fried foods. Read labels carefully. Salad dressings made of unknown oils, sour cream, or cheese, such as blue cheese and Roquefort. Cream, all kinds: half-and-half, light, heavy, or whipping. Sour cream or cream cheese (even if "light" or low-fat). Nondairy cream substitutes: coffee creamers and sour cream substitutes made with palm, palm kernel, hydrogenated oils, or coconut oil.  Beverages  Allowed: Coffee (regular or decaffeinated), tea. diet carbonated beverages, mineral water. Alcohol: Check with your caregiver. Moderation is recommended.   Avoid: Whole milk, regular sodas, and juice drinks with added sugar.  Condiments  Allowed: All seasonings and condiments. Cocoa powder. "Cream"  sauces made with recommended ingredients.   Avoid: Carob powder made with hydrogenated fats.  SAMPLE MENU Breakfast   cup orange juice    cup oatmeal   1 slice toast   1 tsp margarine   1 cup skim milk  Lunch  [place] sandwich with 2 oz [place], 2 slices bread   Lettuce and tomato slices   Fresh fruit   Carrot sticks   Coffee or tea   Snack   Fresh fruit or low-fat crackers  Dinner  3 oz lean ground beef   1 baked potato   1 tsp margarine    cup asparagus   Lettuce salad   1 tbs non-creamy dressing    cup peach slices   1 cup skim milk  Document Released: 02/03/2008 Document Revised: 04/15/2011 Document Reviewed: 04/28/2010 [facility] Patient Information 2012 [place], [place].  Pneumonia, Adult Pneumonia is an infection of the lungs. It may be caused by a germ (virus or bacteria). Some types of pneumonia can spread easily from person to person. This can happen when you cough or sneeze. HOME CARE  Only take medicine as told by your doctor.   Take your medicine (antibiotics) as told. Finish it even if you start to feel better.   Do not  smoke.   You may use a vaporizer or humidifier in your room. This can help loosen thick spit (mucus).   Sleep so you are almost sitting up (semi-upright). This helps reduce coughing.   Rest.  A shot (vaccine) can help prevent pneumonia. Shots are often advised for:  People over 40 years old.   Patients on chemotherapy.   People with long-term (chronic) lung problems.   People with immune system problems.  GET HELP RIGHT AWAY IF:   You are getting worse.   You cannot control your cough, and you are losing sleep.   You cough up blood.   Your pain gets worse, even with medicine.   You have a fever.   Any of your problems are getting worse, not better.   You have shortness of breath or chest pain.  MAKE SURE YOU:   Understand these instructions.   Will watch your condition.   Will get help right away if you are not doing well or get worse.  Document Released: 10/13/2007 Document Revised: 04/15/2011 Document Reviewed: 07/17/2010 [facility] Patient Information 2012 [place], [place].  Incentive Spirometer An incentive spirometer is a tool that can help keep your lungs clear and active. This tool measures how well you are filling your lungs with each breath. Taking long deep breaths may help reverse or decrease the chance of developing breathing (pulmonary) problems (especially infection) following: Surgery of the chest or abdomen.  Surgery if you have a history of smoking or a lung problem.  A long period of time when you are unable to move or be active.  BEFORE THE PROCEDURE  If the spirometer includes an indictor to show your best effort, your nurse or respiratory therapist will set it to a desired goal.  If possible, sit up straight or lean slightly forward. Try not to slouch.  Hold the incentive spirometer in an upright position.  INSTRUCTIONS FOR USE  Sit on the edge of your bed if possible, or sit up as far as you can in bed or on a chair.  Hold the incentive  spirometer in an upright position.  Breathe out normally.  Place the mouthpiece in your mouth and seal your lips tightly around it.  Breathe  in slowly and as deeply as possible, raising the piston or the ball toward the top of the column.  Hold your breath for 3-5 seconds or for as long as possible. Allow the piston or ball to fall to the bottom of the column.  Remove the mouthpiece from your mouth and breathe out normally.  Rest for a few seconds and repeat Steps 1 through 7 at least 10 times every 1-2 hours when you are awake. Take your time and take a few normal breaths between deep breaths.  The spirometer may include an indicator to show your best effort. Use the indicator as a goal to work toward during each repetition.  After each set of 10 deep breaths, practice coughing to be sure your lungs are clear. If you have an incision (the cut made at the time of surgery), support your incision when coughing by placing a pillow or rolled up towels firmly against it.  Once you are able to get out of bed, walk around indoors and cough well. You may stop using the incentive spirometer when instructed by your caregiver.  RISKS AND COMPLICATIONS Breathing too quickly may cause dizziness. At an extreme, this could cause you to pass out. Take your time so you do not get dizzy or light-headed.  If you are in pain, you may need to take or ask for pain medication before doing incentive spirometry. It is harder to take a deep breath if you are having pain.  AFTER USE Rest and breathe slowly and easily.  It can be helpful to keep track of a log of your progress. Your caregiver can provide you with a simple table to help with this.  If you are using the spirometer at home, follow these instructions: SEEK MEDICAL CARE IF:  You are having difficultly using the spirometer.  You have trouble using the spirometer as often as instructed.  Your pain medication is not giving enough relief while using the spirometer.    You develop fever of 100.5 F (38.1 C) or higher.  SEEK IMMEDIATE MEDICAL CARE IF:  You cough up bloody sputum that had not been present before.  You develop fever of 102 F (38.9 C) or greater.  You develop worsening pain at or near the incision site.  MAKE SURE YOU:  Understand these instructions.  Will watch your condition.  Will get help right away if you are not doing well or get worse.  Document Released: 09/06/2006 Document Revised: 04/15/2011 Document Reviewed: 11/07/2006 [employer] Patient Information 2012 [employer] [place]  Rivaroxaban oral tablets What is this medicine? RIVAROXABAN (ri va ROX a ban) is an anticoagulant (blood thinner). It is used after knee or hip surgeries to prevent blood clotting. It is also used to lower the chance of stroke in people with a medical condition called atrial fibrillation. This medicine may be used for other purposes; ask your health care provider or pharmacist if you have questions. What should I tell my health care provider before I take this medicine? They need to know if you have any of these conditions: -bleeding disorders -bleeding in the brain -blood in your stools (black or tarry stools) or if you have blood in your vomit -history of stomach bleeding -kidney disease -liver disease -low blood counts, like low white cell, platelet, or red cell counts -recent or planned spinal or epidural procedure -take medicines that treat or prevent blood clots -an unusual or allergic reaction to rivaroxaban, other medicines, foods, dyes, or preservatives -pregnant or trying  to get pregnant -breast-feeding How should I use this medicine? Take this medicine by mouth with a glass of water. Follow the directions on the prescription label. Take your medicine at regular intervals. Do not take it more often than directed. Do not stop taking except on your doctor's advice. Talk to your pediatrician regarding the use of this medicine in children. Special  care may be needed. Overdosage: If you think you've taken too much of this medicine contact a poison control center or emergency room at once. Overdosage: If you think you have taken too much of this medicine contact a poison control center or emergency room at once. NOTE: This medicine is only for you. Do not share this medicine with others. What if I miss a dose? If you miss a dose, take it as soon as you can. If it is almost time for your next dose, take only that dose. Do not take double or extra doses. What may interact with this medicine? -aspirin and aspirin-like medicines -certain antibiotics like erythromycin, azithromycin, and clarithromycin -certain medicines for fungal infections like ketoconazole and itraconazole -certain medicines for irregular heart beat like amiodarone, quinidine, dronedarone -certain medicines for seizures like carbamazepine, phenytoin -certain medicines that treat or prevent blood clots like warfarin, enoxaparin, and dalteparin  -conivaptan -diltiazem -felodipine -indinavir -lopinavir; ritonavir -NSAIDS, medicines for pain and inflammation, like ibuprofen or naproxen -ranolazine -rifampin -ritonavir -St. John's wort -verapamil This list may not describe all possible interactions. Give your health care provider a list of all the medicines, herbs, non-prescription drugs, or dietary supplements you use. Also tell them if you smoke, drink alcohol, or use illegal drugs. Some items may interact with your medicine. What should I watch for while using this medicine? This medicine may increase your risk to bruise or bleed. Call your doctor or health care professional if you notice any unusual bleeding. Be careful brushing and flossing your teeth or using a toothpick because you may bleed more easily. If you have any dental work done, tell your dentist you are receiving this medicine. What side effects may I notice from receiving this medicine? Side effects that  you should report to your doctor or health care professional as soon as possible: -allergic reactions like skin rash, itching or hives, swelling of the face, lips, or tongue -bloody or black, tarry stools -changes in vision -confusion, trouble speaking or understanding -red or dark-brown urine -redness, blistering, peeling or loosening of the skin, including inside the mouth -severe headaches -spitting up blood or brown material that looks like coffee grounds -sudden numbness or weakness of the face, arm or leg -trouble walking, dizziness, loss of balance or coordination -unusual bruising or bleeding from the eye, gums, or nose  Side effects that usually do not require medical attention (Report these to your doctor or health care professional if they continue or are bothersome.): -dizziness -muscle pain This list may not describe all possible side effects. Call your doctor for medical advice about side effects. You may report side effects to FDA at 1-800-FDA-1088. Where should I keep my medicine? Keep out of the reach of children. Store at room temperature between 15 and 30 degrees C (59 and 86 degrees F). Throw away any unused medicine after the expiration date. NOTE: This sheet is a summary. It may not cover all possible information. If you have questions about this medicine, talk to your doctor, pharmacist, or health care provider.  2012, Elsevier/Gold Standard. (03/17/2010 8:48:10 AM)C.  Saccharomyces boulardii (Florastor) oral dosage  forms What is this medicine? SACCHAROMYCES boulardii (SAK a roe MYE sees boo LAR dee eye) is a supplement. It is used to help the normal balance of bacteria in the colon. The FDA has not approved this supplement for any medical use. This medicine may be used for other purposes; ask your health care provider or pharmacist if you have questions. What should I tell my health care provider before I take this medicine? They need to know if you have any of  these conditions: -chronic disease -have a central venous catheter -immune system problems -an unusual or allergic reaction to Saccharomyces boulardii, Brewer's yeast or other yeast, lactose or milk (Florastor brand contains lactose), other foods, dyes, or preservatives -pregnant or trying to get pregnant -breast-feeding How should I use this medicine? Take this medicine by mouth. Follow the directions on the package labeling, or take as directed by your health care professional. Do not take this medicine more often than directed. Capsules: Swallow capsules whole or mix contents of capsule with water, apple juice or other non-carbonated drinks. Capsule contents can also be added to apple sauce and other soft foods. Do not add to hot beverages. Granules: Mix contents of packet with water, apple juice, or other non-carbonated drinks. For babies, may add to formula after warmed. Can also be added to apple sauce and other soft baby foods. Do not add to hot beverages. Chewable tablets: Chew tablet completely before swallowing. Can be crushed and sprinkled on tongue. Can also be added to water, apple juice, or other non-carbonated drinks or apple sauce or other soft foods. Do not add to hot beverages. Contact your pediatrician regarding the use of this medicine in children. Special care may be needed. This medicine is not recommended for children or infants unless prescribed by a doctor. Overdosage: If you think you have taken too much of this medicine contact a poison control center or emergency room at once. NOTE: This medicine is only for you. Do not share this medicine with others. What if I miss a dose? If you miss a dose, take it as soon as you can. If it is almost time for your next dose, take only that dose. Do not take double or extra doses. What may interact with this medicine? -medications for fungal infections, such as fluconazole, itraconazole, ketoconazole, nystatin, or voriconazole. This  list may not describe all possible interactions. Give your health care provider a list of all the medicines, herbs, non-prescription drugs, or dietary supplements you use. Also tell them if you smoke, drink alcohol, or use illegal drugs. Some items may interact with your medicine. What should I watch for while using this medicine? See your doctor if your symptoms do not get better or if they get worse. If you have allergies to milk or you are sensitive to lactose, do not use the Florastor brand supplement. Florastor contains lactose. What side effects may I notice from receiving this medicine? Side effects that you should report to your doctor or health care professional as soon as possible: -allergic reactions like skin rash, itching or hives, swelling of the face, lips, or tongue -breathing problems -fever -nausea, vomiting -unusually weak or tired Side effects that usually do not require medical attention (report to your doctor or health care professional if they continue or are bothersome): -constipation -increased thirst -mild gas This list may not describe all possible side effects. Call your doctor for medical advice about side effects. You may report side effects to FDA at 1-800-FDA-1088.  Where should I keep my medicine? Keep out of the reach of children. Store at room temperature under 25 degrees C (77 degrees F). Keep granule packets or capsules closed until time of use. Throw away any unused medicine after the expiration date. NOTE: This sheet is a summary. It may not cover all possible information. If you have questions about this medicine, talk to your doctor, pharmacist, or health care provider.  2012, Elsevier/Gold Standard. (10/26/2010 1:10:58 PM)

## 2011-09-20 NOTE — Progress Notes (Signed)
Physical Therapy Treatment Patient Details Name: [name] MRN: [id number] DOB: July 25, 1940 Today's Date: 09/20/2011 Time: 4098-1191 PT Time Calculation (min): 11 min  PT Assessment / Plan / Recommendation Comments on Treatment Session  Patient progressing well. No SOB. Ambulating with wife. Will sign off from acute PT services    Follow Up Recommendations  Home health PT    Barriers to Discharge        Equipment Recommendations  None recommended by PT    Recommendations for Other Services    Frequency     Plan All goals met and education completed, patient dischaged from PT services    Precautions / Restrictions Precautions Precautions: Sternal   Pertinent Vitals/Pain Patient denied    Mobility  Bed Mobility Bed Mobility: Not assessed Transfers Sit to Stand: 7: Independent Stand to Sit: 7: Independent Ambulation/Gait Ambulation/Gait Assistance: 6: Modified independent (Device/Increase time) Ambulation Distance (Feet): 400 Feet Ambulation/Gait Assistance Details: Good stride. patient not guarded today Gait Pattern: Within Functional Limits Gait velocity: WFL Stairs: Yes Stairs Assistance: 4: Min assist Stairs Assistance Details (indicate cue type and reason): min A for HHA as patient does not have rails Stair Management Technique: No rails Number of Stairs: 4     Exercises     PT Diagnosis:    PT Problem List:   PT Treatment Interventions:     PT Goals Acute Rehab PT Goals PT Goal: Supine/Side to Sit - Progress: Other (comment) (per patient and wife, pt doing independently) PT Goal: Sit to Stand - Progress: Other (comment) (per patient and wife, patient doing independently) PT Transfer Goal: Bed to Chair/Chair to Bed - Progress: Met PT Goal: Ambulate - Progress: Met PT Goal: Up/Down Stairs - Progress: Partly met (no rails at home. HHA required by wife)  Visit Information  Last PT Received On: 09/20/11 Assistance Needed: +1    Subjective Data  Subjective: Ive been walking twice a day with my wife   Cognition  Overall Cognitive Status: Appears within functional limits for tasks assessed/performed Arousal/Alertness: Awake/alert Orientation Level: Appears intact for tasks assessed Behavior During Session: [facility] for tasks performed    Balance     End of Session PT - End of Session Equipment Utilized During Treatment: Gait belt Activity Tolerance: Patient tolerated treatment well Patient left: in chair    Rolfe Hartsell, Adline Potter 09/20/2011, 2:24 PM 09/20/2011 Fredrich Birks PTA [phone] pager [phone] office

## 2011-09-20 NOTE — Progress Notes (Signed)
  Subjective: Feeling better, ambulating. Shortness of breath much better.  Objective: Vital signs in last 24 hours: Temp:  [97.2 F (36.2 C)-98.6 F (37 C)] 97.2 F (36.2 C) (05/13 1402) Pulse Rate:  [85-97] 86  (05/13 1515) Cardiac Rhythm:  [-] Normal sinus rhythm;Heart block (05/13 0825) Resp:  [16-18] 17  (05/13 1402) BP: (96-116)/(58-71) 106/71 mmHg (05/13 1515) SpO2:  [95 %-96 %] 95 % (05/13 1402)  Hemodynamic parameters for last 24 hours:    Intake/Output from previous day:   Intake/Output this shift: Total I/O In: 480 [P.O.:480] Out: 275 [Urine:275]  General appearance: alert and cooperative Heart: regular rate and rhythm Lungs: tubular breath sounds over RLL but improving Extremities: edema mild pedal bilat Wound: incisions ok  Lab Results:  Basename 09/19/11 0705 09/18/11 0610  WBC 8.7 8.7  HGB 8.4* 8.3*  HCT 25.5* 24.7*  PLT 163 144*   BMET:  Basename 09/19/11 0705 09/18/11 0610  NA 133* 132*  K 4.2 4.1  CL 98 98  CO2 25 24  GLUCOSE 95 105*  BUN 15 13  CREATININE 1.14 1.17  CALCIUM 9.3 8.9    PT/INR: No results found for this basename: LABPROT,INR in the last 72 hours ABG    Component Value Date/Time   PHART 7.465* 09/12/2011 0454   HCO3 23.1 09/12/2011 0454   TCO2 24.1 09/12/2011 0454   ACIDBASEDEF 0.3 09/12/2011 0454   O2SAT 96.1 09/12/2011 0454   CBG (last 3)  No results found for this basename: GLUCAP:3 in the last 72 hours  Assessment/Plan: CT chest reviewed and there is small residual right pleural effusion.  The RLL is consolidated/atelectatic which accounts for the density seen on cxr.  This should get better with IS, ambulation and coughing.  He can go home from my point of view.  He has appt with me next week with cxr already.  I would keep him on antibiotic for probable RLL pneumonia until resolved and keep him on daily lasix with pedal edema, which is new for him. Discussed with patient and wife.   LOS: 9 days    Iann Rodier  K 09/20/2011

## 2011-09-20 NOTE — Discharge Summary (Signed)
Physician Discharge Summary  Patient ID: [name] MRN: [id number] DOB/AGE: April 20, 1941 71 y.o.  Admit date: 09/11/2011 Discharge date: 09/20/2011   Discharge Diagnoses:    Healthcare-associated pneumonia  Pleural Effusion- right  Hypertension  Hypoxia  Leucocytosis  Pulmonary embolism  Hyponatremia  S/P AVR  Dyspnea  Bilateral leg edema   Discharged Condition: good  Hospital Course: A 71 year old gentleman status post recent open heart surgery who was discharged from the hospital postoperatively only 3 days ago now back with generalized weakness shortness of breath and hypoxia. Patient said he initially thought everything was going well after arriving home until today when he started feeling shaky with generalized weakness as well as some shortness of breath. His ome was initially 101F, he has some cough but not able to produce much of a sputum, he was brought into the emergency room where he was found to have an oxygen saturation of 80% on room air. His chest x-ray shows right more the left sided pleural effusion with questionable lower lobe infiltrate. He is currently stable and his oxygen sats is 96% on 2 L of oxygen. Pt was found to have pulmonary embolus and was admitted and treated for Healthcare acquired pneumonia.  Pulmonary was consulted to see pt and they recommended starting him on Xarelto to treat his pulmonary embolus.  Pt tolerated Xareleto well in the hospital and had no bleeding events.  He received 7 days of IV antibiotics (cefipime) and then placed on augmentin p.o. To continue for at least 5 more days.  CT surgery did a thoracentesis to treat the right pleural effusion because pt continued to complain of SOB.  Pt eventually began to show improvement.  He began to ambulate, use his Incentive spirometer and had much less SOB.  He had a repeat CT chest on 05/12.  CT chest was reviewed and there is small residual right pleural effusion. Per Dr. Sharee Pimple report the RLL is  consolidated/atelectatic which accounts for the density seen on cxr. This should get better with IS, ambulation and coughing. He can go home from my point of view. He has appt with me next week with cxr already. I would keep him on antibiotic for probable RLL pneumonia until resolved and keep him on daily lasix with pedal edema, which is new for him. Discussed with patient and wife.  Pt elected to go home to have close outpatient follow up.  Please see hospital notes and records for full details of this complicated admission.    Consults: CT Surgery Dr. Laneta Simmers / Pulmonary Critical Care  Significant Diagnostic Studies: CT CHEST WITHOUT CONTRAST  Technique: Multidetector CT imaging of the chest was performed  following the standard protocol without IV contrast.  Comparison: 09/19/2011.  Findings: Small to moderate dependently layering right pleural  effusion is present. There is consolidation of the right lower  lobe with air bronchograms, most compatible with pneumonia.  Bilateral gynecomastia is incidentally noted. Postoperative  changes of median sternotomy. Mild pericardial fluid or thickening  is present. Low attenuation of the intravascular compartment  suggesting anemia. Incidental imaging of the upper abdomen is  within normal limits. Aortic valve replacement and CABG markers  noted. The left lung demonstrates atelectasis. Aerated portions  of the right lung demonstrate atelectasis. Thoracic spine  degenerative disease is present. No destructive osseous lesions.  There is no axillary adenopathy. Mediastinal lymph nodes are  present which may be reactive.  IMPRESSION:  1. Small to moderate right pleural effusion layering posteriorly.  2.  Collapse / consolidation of the right lower lobe with air  bronchograms.  3. Postoperative changes of aortic valve replacement and CABG.  Original Report Authenticated By: Andreas Newport, M.D.  CT ANGIOGRAPHY CHEST  Technique: Multidetector CT  imaging of the chest using the  standard protocol during bolus administration of intravenous  contrast. Multiplanar reconstructed images including MIPs were  obtained and reviewed to evaluate the vascular anatomy.  Contrast: OMNIPAQUE IOHEXOL 350 MG/ML SOLN  Comparison: Chest radiograph 09/12/2011.  Findings: Technically adequate study that is positive for pulmonary  embolism. Pulmonary embolus is present in the descending left  pulmonary artery. The study is mildly degraded by respiratory  motion artifact. Left lower lobe posterior basal segment pulmonary  embolus is also identified. Extensive embolus extends into the  right upper, right lower lobe pulmonary arteries. Postoperative  changes median sternotomy, presumably for CABG. Small pericardial  effusion. Moderate right pleural effusion is present with  associated atelectasis. Incidental imaging the upper abdomen is  within normal limits. There are no aggressive osseous lesions  identified. Sternoclavicular joint osteoarthritis is present.  There is straightening of the interventricular septum of the heart,  suggesting right heart strain. Bilateral gynecomastia incidentally  noted. No axillary adenopathy. Bioprosthetic aortic valve  replacement noted.  IMPRESSION:  1. Positive study for pulmonary embolus. Bilateral lower lobe and  right upper lobe pulmonary emboli are identified. Straightening of  the intraventricular septum suggest right heart strain.  2. Moderate right pleural effusion with collapse / consolidation  most promptly affecting the right lower lobe.  3. Small pericardial effusion. Postoperative changes of CABG and  AVR.  Critical Value/emergent results were called by telephone at the  time of interpretation on 09/13/2010 at 1732 hours to Dr.  Sunnie Nielsen, who verbally acknowledged these results.  Original Report Authenticated By: Andreas Newport, M.D.  CHEST - 2 VIEW  Comparison: Two-view chest 09/18/2011.    Findings: The patient is status post aortic valve replacement. A  large right pleural effusion is not significantly changed. There  is significant right lower lobe collapse. Mild pulmonary vascular  congestion is similar to the prior exam. The left lung is clear.  IMPRESSION:  1. Similar appearance of large right pleural effusion and right  lower lobe collapse.  2. Mild pulmonary vascular congestion.  Original Report Authenticated By: Jamesetta Orleans. MATTERN, M.D.  Treatments: thoracentesis right lung  Discharge Vitals Blood pressure 106/71, pulse 86, temperature 97.2 F (36.2 C), temperature source Oral, resp. rate 17, height 5\' 10"  (1.778 m), weight 89.1 kg (196 lb 6.9 oz), SpO2 95.00%.  Disposition: 06-Home-Health Care Svc  Discharge Orders    Future Appointments: Provider: Department: Dept Phone: Center:   09/28/2011 1:30 PM Alleen Borne, MD [facility] [phone] [facility]     Future Orders Please Complete By Expires   Increase activity slowly      Discharge instructions      Scheduling Instructions:   FOLLOW UP WITH DR. BARTLE NEXT WEEK AS SCHEDULED CONTINUE TO USE YOUR INCENTIVE SPIROMETER AT LEAST 5 TIMES PER DAY RETURN IF SYMPTOMS GET WORSE OR NEW PROBLEMS DEVELOP     Medication List  As of 09/20/2011  6:42 PM   TAKE these medications         amoxicillin-clavulanate 875-125 MG per tablet   Commonly known as: AUGMENTIN   Take 1 tablet by mouth every 12 (twelve) hours.      aspirin 325 MG tablet   Take 325 mg by mouth daily.  carboxymethylcellulose 1 % ophthalmic solution   Apply 1 drop to eye 2 (two) times daily as needed. For itchy/dry eyes.      feeding supplement Liqd   Take 237 mLs by mouth 2 (two) times daily between meals.      ferrous gluconate 324 MG tablet   Commonly known as: FERGON   Take 324 mg by mouth daily with breakfast.      furosemide 40 MG tablet   Commonly known as: LASIX   Take 1 tablet (40 mg total) by mouth daily. For 5 days then  stop.      metoprolol tartrate 25 MG tablet   Commonly known as: LOPRESSOR   Take 25 mg by mouth 2 (two) times daily.      multivitamin tablet   Take 1 tablet by mouth daily.      OMEGA 3-6-9 COMPLEX PO   Take 1 capsule by mouth daily.      pantoprazole 40 MG tablet   Commonly known as: PROTONIX   Take 1 tablet (40 mg total) by mouth daily at 12 noon.      potassium chloride SA 20 MEQ tablet   Commonly known as: K-DUR,KLOR-CON   Take 1 tablet (20 mEq total) by mouth daily. For 5 days then stop.      Rivaroxaban 15 MG Tabs tablet   Commonly known as: XARELTO   Take 1 tablet (15 mg total) by mouth 2 (two) times daily with a meal.      saccharomyces boulardii 250 MG capsule   Commonly known as: FLORASTOR   Take 1 capsule (250 mg total) by mouth 2 (two) times daily.      Tamsulosin HCl 0.4 MG Caps   Commonly known as: FLOMAX   Take 0.4 mg by mouth daily.      traMADol 50 MG tablet   Commonly known as: ULTRAM   Take 1 tablet (50 mg total) by mouth every 6 (six) hours as needed.      TRILIPIX 135 MG capsule   Generic drug: Choline Fenofibrate   Take 135 mg by mouth daily.      VITAMIN C PO   Take 1 tablet by mouth daily.      Vitamin D 2000 UNITS Caps   Take 1 capsule by mouth daily.           Follow-up Information    Follow up with Alleen Borne, MD in 1 week. (PT ALREADY HAS APPT)    Contact information:   [address] [place] [address] [phone]       Follow up with Eino Farber, MD in 5 days.   Contact information:   Individual [address] [place] [address] [phone]       Follow up with Lesleigh Noe, MD in 2 weeks. (HOSPITAL FOLLOW UP. PLEASE CALL WITH APPT. )    Contact information:   [address] [place] [address] [place] [address] [phone]         I spent 38 mins preparing discharge, reviewing records, consult notes, writing prescriptions.      Signed: Devlyn Parish 09/20/2011, 6:42 PM

## 2011-09-22 DIAGNOSIS — I10 Essential (primary) hypertension: Secondary | ICD-10-CM | POA: Diagnosis not present

## 2011-09-22 DIAGNOSIS — Z48812 Encounter for surgical aftercare following surgery on the circulatory system: Secondary | ICD-10-CM | POA: Diagnosis not present

## 2011-09-22 DIAGNOSIS — Z7982 Long term (current) use of aspirin: Secondary | ICD-10-CM | POA: Diagnosis not present

## 2011-09-22 DIAGNOSIS — I2510 Atherosclerotic heart disease of native coronary artery without angina pectoris: Secondary | ICD-10-CM | POA: Diagnosis not present

## 2011-09-22 DIAGNOSIS — Z952 Presence of prosthetic heart valve: Secondary | ICD-10-CM | POA: Diagnosis not present

## 2011-09-23 ENCOUNTER — Other Ambulatory Visit: Payer: Self-pay | Admitting: Surgery

## 2011-09-23 DIAGNOSIS — J90 Pleural effusion, not elsewhere classified: Secondary | ICD-10-CM

## 2011-09-28 ENCOUNTER — Ambulatory Visit
Admission: RE | Admit: 2011-09-28 | Discharge: 2011-09-28 | Disposition: A | Discharge status: Home / Self Care | Payer: Medicare Other | Source: Ambulatory Visit | Attending: Surgery | Admitting: Surgery

## 2011-09-28 ENCOUNTER — Ambulatory Visit (INDEPENDENT_AMBULATORY_CARE_PROVIDER_SITE_OTHER): Payer: Self-pay | Admitting: Surgery

## 2011-09-28 ENCOUNTER — Encounter: Payer: Self-pay | Admitting: Surgery

## 2011-09-28 VITALS — BP 110/69 | HR 79 | Resp 18 | Ht 69.0 in | Wt 192.0 lb

## 2011-09-28 DIAGNOSIS — Z952 Presence of prosthetic heart valve: Secondary | ICD-10-CM | POA: Diagnosis not present

## 2011-09-28 DIAGNOSIS — Z951 Presence of aortocoronary bypass graft: Secondary | ICD-10-CM

## 2011-09-28 DIAGNOSIS — I2510 Atherosclerotic heart disease of native coronary artery without angina pectoris: Secondary | ICD-10-CM

## 2011-09-28 DIAGNOSIS — Z48812 Encounter for surgical aftercare following surgery on the circulatory system: Secondary | ICD-10-CM | POA: Diagnosis not present

## 2011-09-28 DIAGNOSIS — J90 Pleural effusion, not elsewhere classified: Secondary | ICD-10-CM | POA: Diagnosis not present

## 2011-09-28 DIAGNOSIS — I10 Essential (primary) hypertension: Secondary | ICD-10-CM | POA: Diagnosis not present

## 2011-09-28 DIAGNOSIS — Z7982 Long term (current) use of aspirin: Secondary | ICD-10-CM | POA: Diagnosis not present

## 2011-09-28 DIAGNOSIS — R059 Cough, unspecified: Secondary | ICD-10-CM | POA: Diagnosis not present

## 2011-09-28 DIAGNOSIS — I350 Nonrheumatic aortic (valve) stenosis: Secondary | ICD-10-CM

## 2011-09-28 DIAGNOSIS — E785 Hyperlipidemia, unspecified: Secondary | ICD-10-CM | POA: Diagnosis not present

## 2011-09-28 DIAGNOSIS — R05 Cough: Secondary | ICD-10-CM | POA: Diagnosis not present

## 2011-09-28 DIAGNOSIS — J9819 Other pulmonary collapse: Secondary | ICD-10-CM | POA: Diagnosis not present

## 2011-09-28 DIAGNOSIS — Z954 Presence of other heart-valve replacement: Secondary | ICD-10-CM

## 2011-09-28 DIAGNOSIS — I359 Nonrheumatic aortic valve disorder, unspecified: Secondary | ICD-10-CM

## 2011-09-28 NOTE — Progress Notes (Signed)
[address]            [name] [address]          [phone]     HPI:  Patient returns for routine postoperative follow-up having undergone coronary bypass graft surgery x1 using a saphenous vein graft to the left circumflex obtuse marginal branch and aortic valve replacement using a 27 mm Edwards pericardial valve on 09/02/2011. The patient's early postoperative recovery while in the hospital was notable for an uncomplicated postoperative course. Since hospital discharge the patient was subsequently readmitted with dyspnea and chest pain and CT scan of the chest showed bilateral pulmonary emboli. It also showed a moderate- sized right pleural effusion with right lower lobe compressive atelectasis. He underwent right thoracentesis by me. He was treated with heparin and then sent home on Rivaroxaban for pulmonary emboli. An echocardiogram in the hospital showed his aortic valve prosthesis was functioning normally and there was trivial mitral regurgitation. Since discharge the patient said he continues to feel better. He has no dyspnea. He still has a nonproductive cough which is bothering him when he tries to sleep at night. He denies any chest discomfort. His only complaint is of some mild edema in his feet. He had some while in the hospital and was sent home on Lasix. His Lasix has subsequently finished and he said he just noticed some edema in his feet this morning.   Current Outpatient Prescriptions  Medication Sig Dispense Refill  . Ascorbic Acid (VITAMIN C PO) Take 1 tablet by mouth daily.      [name] aspirin 325 MG tablet Take 325 mg by mouth daily.      . Cholecalciferol (VITAMIN D) 2000 UNITS CAPS Take 1 capsule by mouth daily.      . Choline Fenofibrate (TRILIPIX) 135 MG capsule Take 135 mg by mouth daily.      . ferrous gluconate (FERGON) 324 MG tablet Take 324 mg by mouth daily with breakfast.      . metoprolol tartrate (LOPRESSOR) 25 MG tablet Take 25  mg by mouth 2 (two) times daily.      . Multiple Vitamin (MULTIVITAMIN) tablet Take 1 tablet by mouth daily.      . Omega 3-6-9 Fatty Acids (OMEGA 3-6-9 COMPLEX PO) Take 1 capsule by mouth daily.      . pantoprazole (PROTONIX) 40 MG tablet Take 1 tablet (40 mg total) by mouth daily at 12 noon.  14 tablet  0  . Rivaroxaban (XARELTO) 15 MG TABS tablet Take 1 tablet (15 mg total) by mouth 2 (two) times daily with a meal.  60 tablet  0  . Tamsulosin HCl (FLOMAX) 0.4 MG CAPS Take 0.4 mg by mouth daily.      [name] saccharomyces boulardii (FLORASTOR) 250 MG capsule Take 1 capsule (250 mg total) by mouth 2 (two) times daily.  30 capsule  0    Physical Exam:  BP 110/69  Pulse 79  Resp 18  Ht 5\' 9"  (1.753 m)  Wt 192 lb (87.091 kg)  BMI 28.35 kg/m2  SpO2 98% He looks well. Cardiac exam shows a regular rate and rhythm with a 1/6 systolic murmur at the apex. The aortic valve prosthesis sounds normal with no murmur over it. Lung exam is clear. The chest incision is healing well and sternum is stable. His leg incision is healing well. There is mild bilateral pedal  edema.  Diagnostic Tests:  *RADIOLOGY REPORT*   Clinical Data: Cough.  History of recent heart surgery.   CHEST - 2 VIEW   Comparison: Chest x-ray 09/19/2011.   Findings: The cardiac silhouette, mediastinal and hilar contours are within normal limits and stable.  Stable surgical changes from aortic valve replacement surgery.  There is a small residual right pleural effusion and minimal overlying atelectasis but this has improved since the prior chest x-ray.  The left lung is clear.  No pulmonary edema or pneumothorax.   IMPRESSION:   Resolving right effusion and right basilar atelectasis.   Original Report Authenticated By: P. Loralie Champagne, M.D.        Impression:  Overall I think he is making a good recovery following his surgery especially considering the fact that he was in the hospital for a couple additional weeks  with pulmonary emboli and right pleural effusion. The right pleural effusion is essentially resolved on chest x-ray. He still has some chronic nonproductive cough which could be secondary to the pulmonary emboli or possibly some residual right basilar atelectasis. I would expect this to improve over time. I encouraged him to continue ambulating as much as possible and use his incentive spirometer. I told him he can return to driving when he feels comfortable with that. He has a followup appointment later today with Dr. Katrinka Blazing and he can decide whether to resume a diuretic. The patient was on Hyzaar preoperatively which was not resumed postoperatively.  Plan:  The patient will continue to followup with Dr. Katrinka Blazing for his cardiology care and will contact me if he develops any problems with his incisions.

## 2011-09-30 DIAGNOSIS — Z7982 Long term (current) use of aspirin: Secondary | ICD-10-CM | POA: Diagnosis not present

## 2011-09-30 DIAGNOSIS — I10 Essential (primary) hypertension: Secondary | ICD-10-CM | POA: Diagnosis not present

## 2011-09-30 DIAGNOSIS — Z952 Presence of prosthetic heart valve: Secondary | ICD-10-CM | POA: Diagnosis not present

## 2011-09-30 DIAGNOSIS — Z48812 Encounter for surgical aftercare following surgery on the circulatory system: Secondary | ICD-10-CM | POA: Diagnosis not present

## 2011-09-30 DIAGNOSIS — I2510 Atherosclerotic heart disease of native coronary artery without angina pectoris: Secondary | ICD-10-CM | POA: Diagnosis not present

## 2011-10-05 DIAGNOSIS — M2559 Pain in other specified joint: Secondary | ICD-10-CM | POA: Diagnosis not present

## 2011-10-05 DIAGNOSIS — IMO0001 Reserved for inherently not codable concepts without codable children: Secondary | ICD-10-CM | POA: Diagnosis not present

## 2011-10-05 DIAGNOSIS — E7800 Pure hypercholesterolemia, unspecified: Secondary | ICD-10-CM | POA: Diagnosis not present

## 2011-10-05 DIAGNOSIS — I119 Hypertensive heart disease without heart failure: Secondary | ICD-10-CM | POA: Diagnosis not present

## 2011-10-05 DIAGNOSIS — R7309 Other abnormal glucose: Secondary | ICD-10-CM | POA: Diagnosis not present

## 2011-10-05 DIAGNOSIS — Z79899 Other long term (current) drug therapy: Secondary | ICD-10-CM | POA: Diagnosis not present

## 2011-10-06 DIAGNOSIS — I2510 Atherosclerotic heart disease of native coronary artery without angina pectoris: Secondary | ICD-10-CM | POA: Diagnosis not present

## 2011-10-06 DIAGNOSIS — I10 Essential (primary) hypertension: Secondary | ICD-10-CM | POA: Diagnosis not present

## 2011-10-06 DIAGNOSIS — Z7982 Long term (current) use of aspirin: Secondary | ICD-10-CM | POA: Diagnosis not present

## 2011-10-06 DIAGNOSIS — Z48812 Encounter for surgical aftercare following surgery on the circulatory system: Secondary | ICD-10-CM | POA: Diagnosis not present

## 2011-10-06 DIAGNOSIS — Z952 Presence of prosthetic heart valve: Secondary | ICD-10-CM | POA: Diagnosis not present

## 2011-10-12 DIAGNOSIS — Z952 Presence of prosthetic heart valve: Secondary | ICD-10-CM | POA: Diagnosis not present

## 2011-10-12 DIAGNOSIS — Z7982 Long term (current) use of aspirin: Secondary | ICD-10-CM | POA: Diagnosis not present

## 2011-10-12 DIAGNOSIS — Z48812 Encounter for surgical aftercare following surgery on the circulatory system: Secondary | ICD-10-CM | POA: Diagnosis not present

## 2011-10-12 DIAGNOSIS — I2510 Atherosclerotic heart disease of native coronary artery without angina pectoris: Secondary | ICD-10-CM | POA: Diagnosis not present

## 2011-10-12 DIAGNOSIS — I10 Essential (primary) hypertension: Secondary | ICD-10-CM | POA: Diagnosis not present

## 2011-10-13 ENCOUNTER — Other Ambulatory Visit (HOSPITAL_COMMUNITY): Payer: Self-pay | Admitting: Pulmonary Disease

## 2011-10-13 ENCOUNTER — Ambulatory Visit (HOSPITAL_COMMUNITY)
Admission: RE | Admit: 2011-10-13 | Discharge: 2011-10-13 | Disposition: A | Discharge status: Home / Self Care | Payer: Medicare Other | Source: Ambulatory Visit | Attending: Pulmonary Disease | Admitting: Pulmonary Disease

## 2011-10-13 DIAGNOSIS — M79671 Pain in right foot: Secondary | ICD-10-CM

## 2011-10-13 DIAGNOSIS — M79609 Pain in unspecified limb: Secondary | ICD-10-CM | POA: Diagnosis not present

## 2011-10-19 DIAGNOSIS — I119 Hypertensive heart disease without heart failure: Secondary | ICD-10-CM | POA: Diagnosis not present

## 2011-10-19 DIAGNOSIS — Z043 Encounter for examination and observation following other accident: Secondary | ICD-10-CM | POA: Diagnosis not present

## 2011-10-19 DIAGNOSIS — Z7982 Long term (current) use of aspirin: Secondary | ICD-10-CM | POA: Diagnosis not present

## 2011-10-19 DIAGNOSIS — Z48812 Encounter for surgical aftercare following surgery on the circulatory system: Secondary | ICD-10-CM | POA: Diagnosis not present

## 2011-10-19 DIAGNOSIS — E7800 Pure hypercholesterolemia, unspecified: Secondary | ICD-10-CM | POA: Diagnosis not present

## 2011-10-19 DIAGNOSIS — Z952 Presence of prosthetic heart valve: Secondary | ICD-10-CM | POA: Diagnosis not present

## 2011-10-19 DIAGNOSIS — I10 Essential (primary) hypertension: Secondary | ICD-10-CM | POA: Diagnosis not present

## 2011-10-19 DIAGNOSIS — S139XXA Sprain of joints and ligaments of unspecified parts of neck, initial encounter: Secondary | ICD-10-CM | POA: Diagnosis not present

## 2011-10-19 DIAGNOSIS — I2510 Atherosclerotic heart disease of native coronary artery without angina pectoris: Secondary | ICD-10-CM | POA: Diagnosis not present

## 2011-10-20 DIAGNOSIS — M79609 Pain in unspecified limb: Secondary | ICD-10-CM | POA: Diagnosis not present

## 2011-10-20 DIAGNOSIS — M7730 Calcaneal spur, unspecified foot: Secondary | ICD-10-CM | POA: Diagnosis not present

## 2011-10-20 DIAGNOSIS — M2020 Hallux rigidus, unspecified foot: Secondary | ICD-10-CM | POA: Diagnosis not present

## 2011-11-03 DIAGNOSIS — I10 Essential (primary) hypertension: Secondary | ICD-10-CM | POA: Diagnosis not present

## 2011-11-03 DIAGNOSIS — I2699 Other pulmonary embolism without acute cor pulmonale: Secondary | ICD-10-CM | POA: Diagnosis not present

## 2011-11-03 DIAGNOSIS — I2510 Atherosclerotic heart disease of native coronary artery without angina pectoris: Secondary | ICD-10-CM | POA: Diagnosis not present

## 2011-11-03 DIAGNOSIS — Z952 Presence of prosthetic heart valve: Secondary | ICD-10-CM | POA: Diagnosis not present

## 2011-11-03 DIAGNOSIS — I359 Nonrheumatic aortic valve disorder, unspecified: Secondary | ICD-10-CM | POA: Diagnosis not present

## 2011-11-05 DIAGNOSIS — Z7982 Long term (current) use of aspirin: Secondary | ICD-10-CM | POA: Diagnosis not present

## 2011-11-05 DIAGNOSIS — I10 Essential (primary) hypertension: Secondary | ICD-10-CM | POA: Diagnosis not present

## 2011-11-05 DIAGNOSIS — Z952 Presence of prosthetic heart valve: Secondary | ICD-10-CM | POA: Diagnosis not present

## 2011-11-05 DIAGNOSIS — I2510 Atherosclerotic heart disease of native coronary artery without angina pectoris: Secondary | ICD-10-CM | POA: Diagnosis not present

## 2011-11-05 DIAGNOSIS — Z48812 Encounter for surgical aftercare following surgery on the circulatory system: Secondary | ICD-10-CM | POA: Diagnosis not present

## 2011-11-19 DIAGNOSIS — R42 Dizziness and giddiness: Secondary | ICD-10-CM | POA: Diagnosis not present

## 2011-11-19 DIAGNOSIS — I359 Nonrheumatic aortic valve disorder, unspecified: Secondary | ICD-10-CM | POA: Diagnosis not present

## 2011-11-19 DIAGNOSIS — I10 Essential (primary) hypertension: Secondary | ICD-10-CM | POA: Diagnosis not present

## 2011-11-23 DIAGNOSIS — I359 Nonrheumatic aortic valve disorder, unspecified: Secondary | ICD-10-CM | POA: Diagnosis not present

## 2011-11-23 DIAGNOSIS — I2510 Atherosclerotic heart disease of native coronary artery without angina pectoris: Secondary | ICD-10-CM | POA: Diagnosis not present

## 2011-11-23 DIAGNOSIS — I10 Essential (primary) hypertension: Secondary | ICD-10-CM | POA: Diagnosis not present

## 2011-11-25 ENCOUNTER — Ambulatory Visit (HOSPITAL_COMMUNITY): Payer: Medicare Other

## 2011-11-29 ENCOUNTER — Encounter (HOSPITAL_COMMUNITY): Payer: Medicare Other

## 2011-12-01 ENCOUNTER — Encounter (HOSPITAL_COMMUNITY): Payer: Medicare Other

## 2011-12-02 ENCOUNTER — Encounter (HOSPITAL_COMMUNITY)
Admission: RE | Admit: 2011-12-02 | Discharge: 2011-12-02 | Disposition: A | Discharge status: Home / Self Care | Payer: Medicare Other | Source: Ambulatory Visit | Attending: Interventional Cardiology | Admitting: Interventional Cardiology

## 2011-12-02 ENCOUNTER — Encounter (HOSPITAL_COMMUNITY): Payer: Self-pay

## 2011-12-02 DIAGNOSIS — E785 Hyperlipidemia, unspecified: Secondary | ICD-10-CM | POA: Insufficient documentation

## 2011-12-02 DIAGNOSIS — I5033 Acute on chronic diastolic (congestive) heart failure: Secondary | ICD-10-CM | POA: Insufficient documentation

## 2011-12-02 DIAGNOSIS — I10 Essential (primary) hypertension: Secondary | ICD-10-CM | POA: Insufficient documentation

## 2011-12-02 DIAGNOSIS — Z5189 Encounter for other specified aftercare: Secondary | ICD-10-CM | POA: Insufficient documentation

## 2011-12-02 DIAGNOSIS — I359 Nonrheumatic aortic valve disorder, unspecified: Secondary | ICD-10-CM | POA: Insufficient documentation

## 2011-12-02 DIAGNOSIS — I2510 Atherosclerotic heart disease of native coronary artery without angina pectoris: Secondary | ICD-10-CM | POA: Insufficient documentation

## 2011-12-02 DIAGNOSIS — Q231 Congenital insufficiency of aortic valve: Secondary | ICD-10-CM | POA: Insufficient documentation

## 2011-12-02 DIAGNOSIS — I509 Heart failure, unspecified: Secondary | ICD-10-CM | POA: Insufficient documentation

## 2011-12-02 DIAGNOSIS — Z951 Presence of aortocoronary bypass graft: Secondary | ICD-10-CM | POA: Insufficient documentation

## 2011-12-02 NOTE — Progress Notes (Signed)
Cardiac Rehab Medication Review by a Pharmacist  Does the patient  feel that his/her medications are working for him/her?  yes  Has the patient been experiencing any side effects to the medications prescribed?  no  Does the patient measure his/her own blood pressure or blood glucose at home?  Yes - BP, tries to at least once a day  Does the patient have any problems obtaining medications due to transportation or finances?   no  Understanding of regimen: excellent Understanding of indications: excellent Potential of compliance: excellent    Pharmacist comments: Pt did not remember to bring bottles. Went down the list and made modifications where applicable.  He is very aware of his medications and indications. Counseled about Xarelto and side effects to watch for.      Doris Cheadle, PharmD Clinical Pharmacist Pager: [phone] Phone: [phone] 12/02/2011 8:43 AM

## 2011-12-03 ENCOUNTER — Encounter (HOSPITAL_COMMUNITY): Payer: Medicare Other

## 2011-12-06 ENCOUNTER — Encounter (HOSPITAL_COMMUNITY): Payer: Medicare Other

## 2011-12-08 ENCOUNTER — Encounter (HOSPITAL_COMMUNITY)
Admission: RE | Admit: 2011-12-08 | Discharge: 2011-12-08 | Disposition: A | Discharge status: Home / Self Care | Payer: Medicare Other | Source: Ambulatory Visit | Attending: Interventional Cardiology | Admitting: Interventional Cardiology

## 2011-12-08 DIAGNOSIS — I10 Essential (primary) hypertension: Secondary | ICD-10-CM | POA: Diagnosis not present

## 2011-12-08 DIAGNOSIS — Z951 Presence of aortocoronary bypass graft: Secondary | ICD-10-CM | POA: Diagnosis not present

## 2011-12-08 DIAGNOSIS — I359 Nonrheumatic aortic valve disorder, unspecified: Secondary | ICD-10-CM | POA: Diagnosis not present

## 2011-12-08 DIAGNOSIS — I2510 Atherosclerotic heart disease of native coronary artery without angina pectoris: Secondary | ICD-10-CM | POA: Diagnosis not present

## 2011-12-08 DIAGNOSIS — E785 Hyperlipidemia, unspecified: Secondary | ICD-10-CM | POA: Diagnosis not present

## 2011-12-08 DIAGNOSIS — Q231 Congenital insufficiency of aortic valve: Secondary | ICD-10-CM | POA: Diagnosis not present

## 2011-12-08 DIAGNOSIS — Z5189 Encounter for other specified aftercare: Secondary | ICD-10-CM | POA: Diagnosis not present

## 2011-12-08 DIAGNOSIS — I5033 Acute on chronic diastolic (congestive) heart failure: Secondary | ICD-10-CM | POA: Diagnosis not present

## 2011-12-08 DIAGNOSIS — I509 Heart failure, unspecified: Secondary | ICD-10-CM | POA: Diagnosis not present

## 2011-12-08 NOTE — Progress Notes (Signed)
Pt started cardiac rehab today.  Pt tolerated light exercise without difficulty. Pt with medical clearance to proceed with rehab from Dr. Katrinka Blazing due to hospitalization for PE in May, 2013.  Monitor shows sr with first degree block which is also present on most recent 12 lead EKG with no noted ectopy.  Continue to monitor.

## 2011-12-10 ENCOUNTER — Encounter (HOSPITAL_COMMUNITY)
Admission: RE | Admit: 2011-12-10 | Discharge: 2011-12-10 | Disposition: A | Discharge status: Home / Self Care | Payer: Medicare Other | Source: Ambulatory Visit | Attending: Interventional Cardiology | Admitting: Interventional Cardiology

## 2011-12-10 DIAGNOSIS — Z5189 Encounter for other specified aftercare: Secondary | ICD-10-CM | POA: Diagnosis not present

## 2011-12-10 DIAGNOSIS — Q231 Congenital insufficiency of aortic valve: Secondary | ICD-10-CM | POA: Insufficient documentation

## 2011-12-10 DIAGNOSIS — E785 Hyperlipidemia, unspecified: Secondary | ICD-10-CM | POA: Insufficient documentation

## 2011-12-10 DIAGNOSIS — I10 Essential (primary) hypertension: Secondary | ICD-10-CM | POA: Diagnosis not present

## 2011-12-10 DIAGNOSIS — I2510 Atherosclerotic heart disease of native coronary artery without angina pectoris: Secondary | ICD-10-CM | POA: Diagnosis not present

## 2011-12-10 DIAGNOSIS — Z951 Presence of aortocoronary bypass graft: Secondary | ICD-10-CM | POA: Insufficient documentation

## 2011-12-10 DIAGNOSIS — I509 Heart failure, unspecified: Secondary | ICD-10-CM | POA: Insufficient documentation

## 2011-12-10 DIAGNOSIS — I5033 Acute on chronic diastolic (congestive) heart failure: Secondary | ICD-10-CM | POA: Diagnosis not present

## 2011-12-10 DIAGNOSIS — I359 Nonrheumatic aortic valve disorder, unspecified: Secondary | ICD-10-CM | POA: Diagnosis not present

## 2011-12-13 ENCOUNTER — Encounter (HOSPITAL_COMMUNITY)
Admission: RE | Admit: 2011-12-13 | Discharge: 2011-12-13 | Disposition: A | Discharge status: Home / Self Care | Payer: Medicare Other | Source: Ambulatory Visit | Attending: Interventional Cardiology | Admitting: Interventional Cardiology

## 2011-12-14 DIAGNOSIS — I119 Hypertensive heart disease without heart failure: Secondary | ICD-10-CM | POA: Diagnosis not present

## 2011-12-14 DIAGNOSIS — R7309 Other abnormal glucose: Secondary | ICD-10-CM | POA: Diagnosis not present

## 2011-12-14 DIAGNOSIS — I359 Nonrheumatic aortic valve disorder, unspecified: Secondary | ICD-10-CM | POA: Diagnosis not present

## 2011-12-14 DIAGNOSIS — Z79899 Other long term (current) drug therapy: Secondary | ICD-10-CM | POA: Diagnosis not present

## 2011-12-14 DIAGNOSIS — B356 Tinea cruris: Secondary | ICD-10-CM | POA: Diagnosis not present

## 2011-12-14 DIAGNOSIS — E7800 Pure hypercholesterolemia, unspecified: Secondary | ICD-10-CM | POA: Diagnosis not present

## 2011-12-15 ENCOUNTER — Encounter (HOSPITAL_COMMUNITY)
Admission: RE | Admit: 2011-12-15 | Discharge: 2011-12-15 | Disposition: A | Discharge status: Home / Self Care | Payer: Medicare Other | Source: Ambulatory Visit | Attending: Interventional Cardiology | Admitting: Interventional Cardiology

## 2011-12-15 NOTE — Progress Notes (Signed)
Reviewed home exercise with pt today.  Pt plans to walk and use exercise bike for exercise.  Reviewed THR, pulse, RPE, sign and symptoms, and when to call 911 or MD.  Pt voiced understanding. Electronically signed by Harriett Sine MS on Wednesday December 15 2011 at [phone]

## 2011-12-17 ENCOUNTER — Encounter (HOSPITAL_COMMUNITY)
Admission: RE | Admit: 2011-12-17 | Discharge: 2011-12-17 | Disposition: A | Discharge status: Home / Self Care | Payer: Medicare Other | Source: Ambulatory Visit | Attending: Interventional Cardiology | Admitting: Interventional Cardiology

## 2011-12-20 ENCOUNTER — Encounter (HOSPITAL_COMMUNITY)
Admission: RE | Admit: 2011-12-20 | Discharge: 2011-12-20 | Disposition: A | Discharge status: Home / Self Care | Payer: Medicare Other | Source: Ambulatory Visit | Attending: Interventional Cardiology | Admitting: Interventional Cardiology

## 2011-12-22 ENCOUNTER — Encounter (HOSPITAL_COMMUNITY)
Admission: RE | Admit: 2011-12-22 | Discharge: 2011-12-22 | Disposition: A | Discharge status: Home / Self Care | Payer: Medicare Other | Source: Ambulatory Visit | Attending: Interventional Cardiology | Admitting: Interventional Cardiology

## 2011-12-24 ENCOUNTER — Encounter (HOSPITAL_COMMUNITY)
Admission: RE | Admit: 2011-12-24 | Discharge: 2011-12-24 | Disposition: A | Discharge status: Home / Self Care | Payer: Medicare Other | Source: Ambulatory Visit | Attending: Interventional Cardiology | Admitting: Interventional Cardiology

## 2011-12-27 ENCOUNTER — Encounter (HOSPITAL_COMMUNITY): Payer: Medicare Other

## 2011-12-29 ENCOUNTER — Encounter (HOSPITAL_COMMUNITY)
Admission: RE | Admit: 2011-12-29 | Discharge: 2011-12-29 | Disposition: A | Discharge status: Home / Self Care | Payer: Medicare Other | Source: Ambulatory Visit | Attending: Interventional Cardiology | Admitting: Interventional Cardiology

## 2011-12-31 ENCOUNTER — Encounter (HOSPITAL_COMMUNITY)
Admission: RE | Admit: 2011-12-31 | Discharge: 2011-12-31 | Disposition: A | Discharge status: Home / Self Care | Payer: Medicare Other | Source: Ambulatory Visit | Attending: Interventional Cardiology | Admitting: Interventional Cardiology

## 2011-12-31 NOTE — Progress Notes (Signed)
[name] 71 y.o. male Nutrition Note Spoke with pt.  Pt is overweight and is currently not interested in losing wt. Per discussion with pt, pt's pre-surgery UBW was 202#. Pt's current wt is down 8.2# from his UBW. Pt would like to maintain his current wt. Nutrition Plan, Nutrition Survey, and cholesterol goals reviewed with pt. Pt is following Step 1 of the Therapeutic Lifestyle Changes diet. According to pt's 24 hour food record, pt consumed 2 servings of fruits and vegetables. Pt is pre-diabetic. Pre-diabetes discussed. Pt expressed understanding.  Nutrition Diagnosis   Food-and nutrition-related knowledge deficit related to lack of exposure to information as related to diagnosis of: ? CVD ? Pre-DM (A1c 6.2)  Nutrition RX/ Estimated Daily Nutrition Needs for: wt loss  2300-2450 Kcal, 75-80 gm fat, 15-16 gm sat fat, 2.3-2.4 gm trans-fat, <1500 mg sodium   Nutrition Intervention   Pt's individual nutrition plan including cholesterol goals reviewed with pt.   Benefits of adopting Therapeutic Lifestyle Changes discussed when Medficts reviewed.   Pt to attend the Portion Distortion class   Pt to attend the  ? Nutrition I class                     ? Nutrition II class   Pt given handouts for Pre-diabetes   Continue client-centered nutrition education by RD, as part of interdisciplinary care. Goal(s)   Pt to identify and limit food sources of saturated fat, trans fat, and cholesterol   Pt to increase fruits and vegetables consumed for his diet to become heart healthier.   Pt able to name foods that affect blood glucose  Monitor and Evaluate progress toward nutrition goal with team.

## 2012-01-03 ENCOUNTER — Encounter (HOSPITAL_COMMUNITY)
Admission: RE | Admit: 2012-01-03 | Discharge: 2012-01-03 | Disposition: A | Discharge status: Home / Self Care | Payer: Medicare Other | Source: Ambulatory Visit | Attending: Interventional Cardiology | Admitting: Interventional Cardiology

## 2012-01-05 ENCOUNTER — Encounter (HOSPITAL_COMMUNITY)
Admission: RE | Admit: 2012-01-05 | Discharge: 2012-01-05 | Disposition: A | Discharge status: Home / Self Care | Payer: Medicare Other | Source: Ambulatory Visit | Attending: Interventional Cardiology | Admitting: Interventional Cardiology

## 2012-01-07 ENCOUNTER — Encounter (HOSPITAL_COMMUNITY): Payer: Medicare Other

## 2012-01-10 ENCOUNTER — Encounter (HOSPITAL_COMMUNITY): Payer: Medicare Other

## 2012-01-10 DIAGNOSIS — I359 Nonrheumatic aortic valve disorder, unspecified: Secondary | ICD-10-CM | POA: Insufficient documentation

## 2012-01-10 DIAGNOSIS — I509 Heart failure, unspecified: Secondary | ICD-10-CM | POA: Insufficient documentation

## 2012-01-10 DIAGNOSIS — I5033 Acute on chronic diastolic (congestive) heart failure: Secondary | ICD-10-CM | POA: Insufficient documentation

## 2012-01-10 DIAGNOSIS — E785 Hyperlipidemia, unspecified: Secondary | ICD-10-CM | POA: Insufficient documentation

## 2012-01-10 DIAGNOSIS — Z951 Presence of aortocoronary bypass graft: Secondary | ICD-10-CM | POA: Insufficient documentation

## 2012-01-10 DIAGNOSIS — I10 Essential (primary) hypertension: Secondary | ICD-10-CM | POA: Insufficient documentation

## 2012-01-10 DIAGNOSIS — Q231 Congenital insufficiency of aortic valve: Secondary | ICD-10-CM | POA: Insufficient documentation

## 2012-01-10 DIAGNOSIS — I2510 Atherosclerotic heart disease of native coronary artery without angina pectoris: Secondary | ICD-10-CM | POA: Insufficient documentation

## 2012-01-10 DIAGNOSIS — Z5189 Encounter for other specified aftercare: Secondary | ICD-10-CM | POA: Insufficient documentation

## 2012-01-11 DIAGNOSIS — H01009 Unspecified blepharitis unspecified eye, unspecified eyelid: Secondary | ICD-10-CM | POA: Diagnosis not present

## 2012-01-12 ENCOUNTER — Encounter (HOSPITAL_COMMUNITY)
Admission: RE | Admit: 2012-01-12 | Discharge: 2012-01-12 | Disposition: A | Discharge status: Home / Self Care | Payer: Medicare Other | Source: Ambulatory Visit | Attending: Interventional Cardiology | Admitting: Interventional Cardiology

## 2012-01-12 DIAGNOSIS — I10 Essential (primary) hypertension: Secondary | ICD-10-CM | POA: Diagnosis not present

## 2012-01-12 DIAGNOSIS — I5033 Acute on chronic diastolic (congestive) heart failure: Secondary | ICD-10-CM | POA: Diagnosis not present

## 2012-01-12 DIAGNOSIS — Z951 Presence of aortocoronary bypass graft: Secondary | ICD-10-CM | POA: Diagnosis not present

## 2012-01-12 DIAGNOSIS — I359 Nonrheumatic aortic valve disorder, unspecified: Secondary | ICD-10-CM | POA: Diagnosis not present

## 2012-01-12 DIAGNOSIS — Q231 Congenital insufficiency of aortic valve: Secondary | ICD-10-CM | POA: Diagnosis not present

## 2012-01-12 DIAGNOSIS — E785 Hyperlipidemia, unspecified: Secondary | ICD-10-CM | POA: Diagnosis not present

## 2012-01-12 DIAGNOSIS — Z5189 Encounter for other specified aftercare: Secondary | ICD-10-CM | POA: Diagnosis not present

## 2012-01-12 DIAGNOSIS — I2510 Atherosclerotic heart disease of native coronary artery without angina pectoris: Secondary | ICD-10-CM | POA: Diagnosis not present

## 2012-01-12 DIAGNOSIS — I509 Heart failure, unspecified: Secondary | ICD-10-CM | POA: Diagnosis not present

## 2012-01-13 DIAGNOSIS — M5412 Radiculopathy, cervical region: Secondary | ICD-10-CM | POA: Diagnosis not present

## 2012-01-13 DIAGNOSIS — M9981 Other biomechanical lesions of cervical region: Secondary | ICD-10-CM | POA: Diagnosis not present

## 2012-01-13 DIAGNOSIS — M999 Biomechanical lesion, unspecified: Secondary | ICD-10-CM | POA: Diagnosis not present

## 2012-01-13 DIAGNOSIS — M5137 Other intervertebral disc degeneration, lumbosacral region: Secondary | ICD-10-CM | POA: Diagnosis not present

## 2012-01-14 ENCOUNTER — Encounter (HOSPITAL_COMMUNITY)
Admission: RE | Admit: 2012-01-14 | Discharge: 2012-01-14 | Disposition: A | Discharge status: Home / Self Care | Payer: Medicare Other | Source: Ambulatory Visit | Attending: Interventional Cardiology | Admitting: Interventional Cardiology

## 2012-01-14 DIAGNOSIS — M9981 Other biomechanical lesions of cervical region: Secondary | ICD-10-CM | POA: Diagnosis not present

## 2012-01-14 DIAGNOSIS — M999 Biomechanical lesion, unspecified: Secondary | ICD-10-CM | POA: Diagnosis not present

## 2012-01-14 DIAGNOSIS — M5137 Other intervertebral disc degeneration, lumbosacral region: Secondary | ICD-10-CM | POA: Diagnosis not present

## 2012-01-14 DIAGNOSIS — M5412 Radiculopathy, cervical region: Secondary | ICD-10-CM | POA: Diagnosis not present

## 2012-01-17 ENCOUNTER — Encounter (HOSPITAL_COMMUNITY)
Admission: RE | Admit: 2012-01-17 | Discharge: 2012-01-17 | Disposition: A | Discharge status: Home / Self Care | Payer: Medicare Other | Source: Ambulatory Visit | Attending: Interventional Cardiology | Admitting: Interventional Cardiology

## 2012-01-19 ENCOUNTER — Encounter (HOSPITAL_COMMUNITY)
Admission: RE | Admit: 2012-01-19 | Discharge: 2012-01-19 | Disposition: A | Discharge status: Home / Self Care | Payer: Medicare Other | Source: Ambulatory Visit | Attending: Interventional Cardiology | Admitting: Interventional Cardiology

## 2012-01-21 ENCOUNTER — Encounter (HOSPITAL_COMMUNITY)
Admission: RE | Admit: 2012-01-21 | Discharge: 2012-01-21 | Disposition: A | Discharge status: Home / Self Care | Payer: Medicare Other | Source: Ambulatory Visit | Attending: Interventional Cardiology | Admitting: Interventional Cardiology

## 2012-01-24 ENCOUNTER — Encounter (HOSPITAL_COMMUNITY)
Admission: RE | Admit: 2012-01-24 | Discharge: 2012-01-24 | Disposition: A | Discharge status: Home / Self Care | Payer: Medicare Other | Source: Ambulatory Visit | Attending: Interventional Cardiology | Admitting: Interventional Cardiology

## 2012-01-25 DIAGNOSIS — E785 Hyperlipidemia, unspecified: Secondary | ICD-10-CM | POA: Diagnosis not present

## 2012-01-25 DIAGNOSIS — H16009 Unspecified corneal ulcer, unspecified eye: Secondary | ICD-10-CM | POA: Diagnosis not present

## 2012-01-25 DIAGNOSIS — Z952 Presence of prosthetic heart valve: Secondary | ICD-10-CM | POA: Diagnosis not present

## 2012-01-25 DIAGNOSIS — H16229 Keratoconjunctivitis sicca, not specified as Sjogren's, unspecified eye: Secondary | ICD-10-CM | POA: Diagnosis not present

## 2012-01-25 DIAGNOSIS — I2699 Other pulmonary embolism without acute cor pulmonale: Secondary | ICD-10-CM | POA: Diagnosis not present

## 2012-01-25 DIAGNOSIS — I10 Essential (primary) hypertension: Secondary | ICD-10-CM | POA: Diagnosis not present

## 2012-01-26 ENCOUNTER — Encounter (HOSPITAL_COMMUNITY)
Admission: RE | Admit: 2012-01-26 | Discharge: 2012-01-26 | Disposition: A | Discharge status: Home / Self Care | Payer: Medicare Other | Source: Ambulatory Visit | Attending: Interventional Cardiology | Admitting: Interventional Cardiology

## 2012-01-28 ENCOUNTER — Encounter (HOSPITAL_COMMUNITY)
Admission: RE | Admit: 2012-01-28 | Discharge: 2012-01-28 | Disposition: A | Discharge status: Home / Self Care | Payer: Medicare Other | Source: Ambulatory Visit | Attending: Interventional Cardiology | Admitting: Interventional Cardiology

## 2012-01-28 DIAGNOSIS — M9981 Other biomechanical lesions of cervical region: Secondary | ICD-10-CM | POA: Diagnosis not present

## 2012-01-28 DIAGNOSIS — M5412 Radiculopathy, cervical region: Secondary | ICD-10-CM | POA: Diagnosis not present

## 2012-01-28 DIAGNOSIS — M5137 Other intervertebral disc degeneration, lumbosacral region: Secondary | ICD-10-CM | POA: Diagnosis not present

## 2012-01-28 DIAGNOSIS — M999 Biomechanical lesion, unspecified: Secondary | ICD-10-CM | POA: Diagnosis not present

## 2012-01-31 ENCOUNTER — Encounter (HOSPITAL_COMMUNITY)
Admission: RE | Admit: 2012-01-31 | Discharge: 2012-01-31 | Disposition: A | Discharge status: Home / Self Care | Payer: Medicare Other | Source: Ambulatory Visit | Attending: Interventional Cardiology | Admitting: Interventional Cardiology

## 2012-02-02 ENCOUNTER — Encounter (HOSPITAL_COMMUNITY): Payer: Medicare Other

## 2012-02-04 ENCOUNTER — Encounter (HOSPITAL_COMMUNITY)
Admission: RE | Admit: 2012-02-04 | Discharge: 2012-02-04 | Disposition: A | Discharge status: Home / Self Care | Payer: Medicare Other | Source: Ambulatory Visit | Attending: Interventional Cardiology | Admitting: Interventional Cardiology

## 2012-02-04 DIAGNOSIS — M999 Biomechanical lesion, unspecified: Secondary | ICD-10-CM | POA: Diagnosis not present

## 2012-02-04 DIAGNOSIS — M5137 Other intervertebral disc degeneration, lumbosacral region: Secondary | ICD-10-CM | POA: Diagnosis not present

## 2012-02-04 DIAGNOSIS — M5412 Radiculopathy, cervical region: Secondary | ICD-10-CM | POA: Diagnosis not present

## 2012-02-04 DIAGNOSIS — M9981 Other biomechanical lesions of cervical region: Secondary | ICD-10-CM | POA: Diagnosis not present

## 2012-02-07 ENCOUNTER — Encounter (HOSPITAL_COMMUNITY): Payer: Medicare Other

## 2012-02-09 ENCOUNTER — Encounter (HOSPITAL_COMMUNITY)
Admission: RE | Admit: 2012-02-09 | Discharge: 2012-02-09 | Disposition: A | Discharge status: Home / Self Care | Payer: Medicare Other | Source: Ambulatory Visit | Attending: Interventional Cardiology | Admitting: Interventional Cardiology

## 2012-02-09 DIAGNOSIS — Z5189 Encounter for other specified aftercare: Secondary | ICD-10-CM | POA: Diagnosis not present

## 2012-02-09 DIAGNOSIS — I359 Nonrheumatic aortic valve disorder, unspecified: Secondary | ICD-10-CM | POA: Insufficient documentation

## 2012-02-09 DIAGNOSIS — I2510 Atherosclerotic heart disease of native coronary artery without angina pectoris: Secondary | ICD-10-CM | POA: Insufficient documentation

## 2012-02-09 DIAGNOSIS — I5033 Acute on chronic diastolic (congestive) heart failure: Secondary | ICD-10-CM | POA: Diagnosis not present

## 2012-02-09 DIAGNOSIS — I509 Heart failure, unspecified: Secondary | ICD-10-CM | POA: Diagnosis not present

## 2012-02-09 DIAGNOSIS — Q231 Congenital insufficiency of aortic valve: Secondary | ICD-10-CM | POA: Diagnosis not present

## 2012-02-09 DIAGNOSIS — Z951 Presence of aortocoronary bypass graft: Secondary | ICD-10-CM | POA: Diagnosis not present

## 2012-02-09 DIAGNOSIS — E785 Hyperlipidemia, unspecified: Secondary | ICD-10-CM | POA: Insufficient documentation

## 2012-02-09 DIAGNOSIS — I10 Essential (primary) hypertension: Secondary | ICD-10-CM | POA: Diagnosis not present

## 2012-02-11 ENCOUNTER — Encounter (HOSPITAL_COMMUNITY): Payer: Medicare Other

## 2012-02-14 ENCOUNTER — Encounter (HOSPITAL_COMMUNITY)
Admission: RE | Admit: 2012-02-14 | Discharge: 2012-02-14 | Disposition: A | Discharge status: Home / Self Care | Payer: Medicare Other | Source: Ambulatory Visit | Attending: Interventional Cardiology | Admitting: Interventional Cardiology

## 2012-02-15 DIAGNOSIS — M5412 Radiculopathy, cervical region: Secondary | ICD-10-CM | POA: Diagnosis not present

## 2012-02-15 DIAGNOSIS — M5137 Other intervertebral disc degeneration, lumbosacral region: Secondary | ICD-10-CM | POA: Diagnosis not present

## 2012-02-15 DIAGNOSIS — M999 Biomechanical lesion, unspecified: Secondary | ICD-10-CM | POA: Diagnosis not present

## 2012-02-15 DIAGNOSIS — M9981 Other biomechanical lesions of cervical region: Secondary | ICD-10-CM | POA: Diagnosis not present

## 2012-02-16 ENCOUNTER — Encounter (HOSPITAL_COMMUNITY)
Admission: RE | Admit: 2012-02-16 | Discharge: 2012-02-16 | Disposition: A | Discharge status: Home / Self Care | Payer: Medicare Other | Source: Ambulatory Visit | Attending: Interventional Cardiology | Admitting: Interventional Cardiology

## 2012-02-18 ENCOUNTER — Encounter (HOSPITAL_COMMUNITY)
Admission: RE | Admit: 2012-02-18 | Discharge: 2012-02-18 | Disposition: A | Discharge status: Home / Self Care | Payer: Medicare Other | Source: Ambulatory Visit | Attending: Interventional Cardiology | Admitting: Interventional Cardiology

## 2012-02-21 ENCOUNTER — Encounter (HOSPITAL_COMMUNITY): Payer: Medicare Other

## 2012-02-21 DIAGNOSIS — I359 Nonrheumatic aortic valve disorder, unspecified: Secondary | ICD-10-CM | POA: Diagnosis not present

## 2012-02-21 DIAGNOSIS — I25810 Atherosclerosis of coronary artery bypass graft(s) without angina pectoris: Secondary | ICD-10-CM | POA: Diagnosis not present

## 2012-02-21 DIAGNOSIS — Z23 Encounter for immunization: Secondary | ICD-10-CM | POA: Diagnosis not present

## 2012-02-21 DIAGNOSIS — I119 Hypertensive heart disease without heart failure: Secondary | ICD-10-CM | POA: Diagnosis not present

## 2012-02-23 ENCOUNTER — Encounter (HOSPITAL_COMMUNITY)
Admission: RE | Admit: 2012-02-23 | Discharge: 2012-02-23 | Disposition: A | Discharge status: Home / Self Care | Payer: Medicare Other | Source: Ambulatory Visit | Attending: Interventional Cardiology | Admitting: Interventional Cardiology

## 2012-02-25 ENCOUNTER — Encounter (HOSPITAL_COMMUNITY): Payer: Medicare Other

## 2012-02-25 DIAGNOSIS — M5137 Other intervertebral disc degeneration, lumbosacral region: Secondary | ICD-10-CM | POA: Diagnosis not present

## 2012-02-25 DIAGNOSIS — M999 Biomechanical lesion, unspecified: Secondary | ICD-10-CM | POA: Diagnosis not present

## 2012-02-25 DIAGNOSIS — M5412 Radiculopathy, cervical region: Secondary | ICD-10-CM | POA: Diagnosis not present

## 2012-02-25 DIAGNOSIS — M9981 Other biomechanical lesions of cervical region: Secondary | ICD-10-CM | POA: Diagnosis not present

## 2012-02-28 ENCOUNTER — Encounter (HOSPITAL_COMMUNITY): Payer: Medicare Other

## 2012-03-01 ENCOUNTER — Encounter (HOSPITAL_COMMUNITY)
Admission: RE | Admit: 2012-03-01 | Discharge: 2012-03-01 | Disposition: A | Discharge status: Home / Self Care | Payer: Medicare Other | Source: Ambulatory Visit | Attending: Interventional Cardiology | Admitting: Interventional Cardiology

## 2012-03-03 ENCOUNTER — Encounter (HOSPITAL_COMMUNITY)
Admission: RE | Admit: 2012-03-03 | Discharge: 2012-03-03 | Disposition: A | Discharge status: Home / Self Care | Payer: Medicare Other | Source: Ambulatory Visit | Attending: Interventional Cardiology | Admitting: Interventional Cardiology

## 2012-03-03 DIAGNOSIS — M5137 Other intervertebral disc degeneration, lumbosacral region: Secondary | ICD-10-CM | POA: Diagnosis not present

## 2012-03-03 DIAGNOSIS — M5412 Radiculopathy, cervical region: Secondary | ICD-10-CM | POA: Diagnosis not present

## 2012-03-03 DIAGNOSIS — M9981 Other biomechanical lesions of cervical region: Secondary | ICD-10-CM | POA: Diagnosis not present

## 2012-03-03 DIAGNOSIS — M999 Biomechanical lesion, unspecified: Secondary | ICD-10-CM | POA: Diagnosis not present

## 2012-03-06 ENCOUNTER — Encounter (HOSPITAL_COMMUNITY)
Admission: RE | Admit: 2012-03-06 | Discharge: 2012-03-06 | Disposition: A | Discharge status: Home / Self Care | Payer: Medicare Other | Source: Ambulatory Visit | Attending: Interventional Cardiology | Admitting: Interventional Cardiology

## 2012-03-08 ENCOUNTER — Encounter (HOSPITAL_COMMUNITY)
Admission: RE | Admit: 2012-03-08 | Discharge: 2012-03-08 | Disposition: A | Discharge status: Home / Self Care | Payer: Medicare Other | Source: Ambulatory Visit | Attending: Interventional Cardiology | Admitting: Interventional Cardiology

## 2012-03-09 DIAGNOSIS — M5412 Radiculopathy, cervical region: Secondary | ICD-10-CM | POA: Diagnosis not present

## 2012-03-09 DIAGNOSIS — M999 Biomechanical lesion, unspecified: Secondary | ICD-10-CM | POA: Diagnosis not present

## 2012-03-09 DIAGNOSIS — M5137 Other intervertebral disc degeneration, lumbosacral region: Secondary | ICD-10-CM | POA: Diagnosis not present

## 2012-03-09 DIAGNOSIS — M9981 Other biomechanical lesions of cervical region: Secondary | ICD-10-CM | POA: Diagnosis not present

## 2012-03-10 ENCOUNTER — Encounter (HOSPITAL_COMMUNITY): Payer: Medicare Other

## 2012-03-10 DIAGNOSIS — M999 Biomechanical lesion, unspecified: Secondary | ICD-10-CM | POA: Diagnosis not present

## 2012-03-10 DIAGNOSIS — M5137 Other intervertebral disc degeneration, lumbosacral region: Secondary | ICD-10-CM | POA: Diagnosis not present

## 2012-03-10 DIAGNOSIS — M9981 Other biomechanical lesions of cervical region: Secondary | ICD-10-CM | POA: Diagnosis not present

## 2012-03-10 DIAGNOSIS — M5412 Radiculopathy, cervical region: Secondary | ICD-10-CM | POA: Diagnosis not present

## 2012-03-13 ENCOUNTER — Encounter (HOSPITAL_COMMUNITY)
Admission: RE | Admit: 2012-03-13 | Discharge: 2012-03-13 | Disposition: A | Discharge status: Home / Self Care | Payer: Medicare Other | Source: Ambulatory Visit | Attending: Interventional Cardiology | Admitting: Interventional Cardiology

## 2012-03-13 DIAGNOSIS — Z5189 Encounter for other specified aftercare: Secondary | ICD-10-CM | POA: Insufficient documentation

## 2012-03-13 DIAGNOSIS — I509 Heart failure, unspecified: Secondary | ICD-10-CM | POA: Diagnosis not present

## 2012-03-13 DIAGNOSIS — E785 Hyperlipidemia, unspecified: Secondary | ICD-10-CM | POA: Insufficient documentation

## 2012-03-13 DIAGNOSIS — I359 Nonrheumatic aortic valve disorder, unspecified: Secondary | ICD-10-CM | POA: Insufficient documentation

## 2012-03-13 DIAGNOSIS — Q231 Congenital insufficiency of aortic valve: Secondary | ICD-10-CM | POA: Diagnosis not present

## 2012-03-13 DIAGNOSIS — I2510 Atherosclerotic heart disease of native coronary artery without angina pectoris: Secondary | ICD-10-CM | POA: Insufficient documentation

## 2012-03-13 DIAGNOSIS — I5033 Acute on chronic diastolic (congestive) heart failure: Secondary | ICD-10-CM | POA: Diagnosis not present

## 2012-03-13 DIAGNOSIS — I10 Essential (primary) hypertension: Secondary | ICD-10-CM | POA: Diagnosis not present

## 2012-03-13 DIAGNOSIS — Z951 Presence of aortocoronary bypass graft: Secondary | ICD-10-CM | POA: Insufficient documentation

## 2012-03-15 ENCOUNTER — Encounter (HOSPITAL_COMMUNITY)
Admission: RE | Admit: 2012-03-15 | Discharge: 2012-03-15 | Disposition: A | Discharge status: Home / Self Care | Payer: Medicare Other | Source: Ambulatory Visit | Attending: Interventional Cardiology | Admitting: Interventional Cardiology

## 2012-03-17 ENCOUNTER — Encounter (HOSPITAL_COMMUNITY)
Admission: RE | Admit: 2012-03-17 | Discharge: 2012-03-17 | Disposition: A | Discharge status: Home / Self Care | Payer: Medicare Other | Source: Ambulatory Visit | Attending: Interventional Cardiology | Admitting: Interventional Cardiology

## 2012-03-17 DIAGNOSIS — M999 Biomechanical lesion, unspecified: Secondary | ICD-10-CM | POA: Diagnosis not present

## 2012-03-17 DIAGNOSIS — M5137 Other intervertebral disc degeneration, lumbosacral region: Secondary | ICD-10-CM | POA: Diagnosis not present

## 2012-03-17 DIAGNOSIS — M9981 Other biomechanical lesions of cervical region: Secondary | ICD-10-CM | POA: Diagnosis not present

## 2012-03-17 DIAGNOSIS — M5412 Radiculopathy, cervical region: Secondary | ICD-10-CM | POA: Diagnosis not present

## 2012-03-20 ENCOUNTER — Encounter (HOSPITAL_COMMUNITY)
Admission: RE | Admit: 2012-03-20 | Discharge: 2012-03-20 | Disposition: A | Discharge status: Home / Self Care | Payer: Medicare Other | Source: Ambulatory Visit | Attending: Interventional Cardiology | Admitting: Interventional Cardiology

## 2012-03-21 DIAGNOSIS — I119 Hypertensive heart disease without heart failure: Secondary | ICD-10-CM | POA: Diagnosis not present

## 2012-03-21 DIAGNOSIS — E7800 Pure hypercholesterolemia, unspecified: Secondary | ICD-10-CM | POA: Diagnosis not present

## 2012-03-21 DIAGNOSIS — R7309 Other abnormal glucose: Secondary | ICD-10-CM | POA: Diagnosis not present

## 2012-03-21 DIAGNOSIS — Z79899 Other long term (current) drug therapy: Secondary | ICD-10-CM | POA: Diagnosis not present

## 2012-03-21 DIAGNOSIS — R209 Unspecified disturbances of skin sensation: Secondary | ICD-10-CM | POA: Diagnosis not present

## 2012-03-22 ENCOUNTER — Encounter (HOSPITAL_COMMUNITY): Payer: Self-pay

## 2012-03-22 ENCOUNTER — Encounter (HOSPITAL_COMMUNITY)
Admission: RE | Admit: 2012-03-22 | Discharge: 2012-03-22 | Disposition: A | Discharge status: Home / Self Care | Payer: Medicare Other | Source: Ambulatory Visit | Attending: Interventional Cardiology | Admitting: Interventional Cardiology

## 2012-03-22 NOTE — Progress Notes (Signed)
Pt graduated from cardiac rehab today.  Pt has made significant positive lifestyle changes and should be commended for his efforts.  Pt plans to exercise on his own at home with stationary bike and walking.

## 2012-03-24 ENCOUNTER — Encounter (HOSPITAL_COMMUNITY): Payer: Medicare Other

## 2012-03-27 ENCOUNTER — Encounter (HOSPITAL_COMMUNITY): Payer: Medicare Other

## 2012-03-29 ENCOUNTER — Encounter (HOSPITAL_COMMUNITY): Payer: Medicare Other

## 2012-03-31 ENCOUNTER — Encounter (HOSPITAL_COMMUNITY): Payer: Medicare Other

## 2012-03-31 DIAGNOSIS — M5412 Radiculopathy, cervical region: Secondary | ICD-10-CM | POA: Diagnosis not present

## 2012-03-31 DIAGNOSIS — M999 Biomechanical lesion, unspecified: Secondary | ICD-10-CM | POA: Diagnosis not present

## 2012-03-31 DIAGNOSIS — M9981 Other biomechanical lesions of cervical region: Secondary | ICD-10-CM | POA: Diagnosis not present

## 2012-03-31 DIAGNOSIS — M5137 Other intervertebral disc degeneration, lumbosacral region: Secondary | ICD-10-CM | POA: Diagnosis not present

## 2012-04-07 DIAGNOSIS — M9981 Other biomechanical lesions of cervical region: Secondary | ICD-10-CM | POA: Diagnosis not present

## 2012-04-07 DIAGNOSIS — M5412 Radiculopathy, cervical region: Secondary | ICD-10-CM | POA: Diagnosis not present

## 2012-04-07 DIAGNOSIS — M999 Biomechanical lesion, unspecified: Secondary | ICD-10-CM | POA: Diagnosis not present

## 2012-04-07 DIAGNOSIS — M5137 Other intervertebral disc degeneration, lumbosacral region: Secondary | ICD-10-CM | POA: Diagnosis not present

## 2012-04-27 DIAGNOSIS — M5137 Other intervertebral disc degeneration, lumbosacral region: Secondary | ICD-10-CM | POA: Diagnosis not present

## 2012-04-27 DIAGNOSIS — M5412 Radiculopathy, cervical region: Secondary | ICD-10-CM | POA: Diagnosis not present

## 2012-04-27 DIAGNOSIS — M999 Biomechanical lesion, unspecified: Secondary | ICD-10-CM | POA: Diagnosis not present

## 2012-04-27 DIAGNOSIS — M9981 Other biomechanical lesions of cervical region: Secondary | ICD-10-CM | POA: Diagnosis not present

## 2012-04-28 DIAGNOSIS — M5412 Radiculopathy, cervical region: Secondary | ICD-10-CM | POA: Diagnosis not present

## 2012-04-28 DIAGNOSIS — M9981 Other biomechanical lesions of cervical region: Secondary | ICD-10-CM | POA: Diagnosis not present

## 2012-04-28 DIAGNOSIS — M5137 Other intervertebral disc degeneration, lumbosacral region: Secondary | ICD-10-CM | POA: Diagnosis not present

## 2012-04-28 DIAGNOSIS — M999 Biomechanical lesion, unspecified: Secondary | ICD-10-CM | POA: Diagnosis not present

## 2012-05-19 DIAGNOSIS — H16009 Unspecified corneal ulcer, unspecified eye: Secondary | ICD-10-CM | POA: Diagnosis not present

## 2012-05-19 DIAGNOSIS — H16229 Keratoconjunctivitis sicca, not specified as Sjogren's, unspecified eye: Secondary | ICD-10-CM | POA: Diagnosis not present

## 2012-05-26 DIAGNOSIS — M5412 Radiculopathy, cervical region: Secondary | ICD-10-CM | POA: Diagnosis not present

## 2012-05-26 DIAGNOSIS — M5137 Other intervertebral disc degeneration, lumbosacral region: Secondary | ICD-10-CM | POA: Diagnosis not present

## 2012-05-26 DIAGNOSIS — M999 Biomechanical lesion, unspecified: Secondary | ICD-10-CM | POA: Diagnosis not present

## 2012-05-26 DIAGNOSIS — M9981 Other biomechanical lesions of cervical region: Secondary | ICD-10-CM | POA: Diagnosis not present

## 2012-05-29 DIAGNOSIS — N401 Enlarged prostate with lower urinary tract symptoms: Secondary | ICD-10-CM | POA: Diagnosis not present

## 2012-05-29 DIAGNOSIS — N138 Other obstructive and reflux uropathy: Secondary | ICD-10-CM | POA: Diagnosis not present

## 2012-06-01 DIAGNOSIS — N529 Male erectile dysfunction, unspecified: Secondary | ICD-10-CM | POA: Diagnosis not present

## 2012-06-01 DIAGNOSIS — N138 Other obstructive and reflux uropathy: Secondary | ICD-10-CM | POA: Diagnosis not present

## 2012-06-02 DIAGNOSIS — M5412 Radiculopathy, cervical region: Secondary | ICD-10-CM | POA: Diagnosis not present

## 2012-06-02 DIAGNOSIS — M5137 Other intervertebral disc degeneration, lumbosacral region: Secondary | ICD-10-CM | POA: Diagnosis not present

## 2012-06-02 DIAGNOSIS — M9981 Other biomechanical lesions of cervical region: Secondary | ICD-10-CM | POA: Diagnosis not present

## 2012-06-02 DIAGNOSIS — M999 Biomechanical lesion, unspecified: Secondary | ICD-10-CM | POA: Diagnosis not present

## 2012-06-08 DIAGNOSIS — M9981 Other biomechanical lesions of cervical region: Secondary | ICD-10-CM | POA: Diagnosis not present

## 2012-06-08 DIAGNOSIS — M5412 Radiculopathy, cervical region: Secondary | ICD-10-CM | POA: Diagnosis not present

## 2012-06-08 DIAGNOSIS — M5137 Other intervertebral disc degeneration, lumbosacral region: Secondary | ICD-10-CM | POA: Diagnosis not present

## 2012-06-08 DIAGNOSIS — M999 Biomechanical lesion, unspecified: Secondary | ICD-10-CM | POA: Diagnosis not present

## 2012-06-09 DIAGNOSIS — M999 Biomechanical lesion, unspecified: Secondary | ICD-10-CM | POA: Diagnosis not present

## 2012-06-09 DIAGNOSIS — M9981 Other biomechanical lesions of cervical region: Secondary | ICD-10-CM | POA: Diagnosis not present

## 2012-06-09 DIAGNOSIS — M5412 Radiculopathy, cervical region: Secondary | ICD-10-CM | POA: Diagnosis not present

## 2012-06-09 DIAGNOSIS — M5137 Other intervertebral disc degeneration, lumbosacral region: Secondary | ICD-10-CM | POA: Diagnosis not present

## 2012-06-16 DIAGNOSIS — Z952 Presence of prosthetic heart valve: Secondary | ICD-10-CM | POA: Diagnosis not present

## 2012-06-16 DIAGNOSIS — I10 Essential (primary) hypertension: Secondary | ICD-10-CM | POA: Diagnosis not present

## 2012-06-23 DIAGNOSIS — M5412 Radiculopathy, cervical region: Secondary | ICD-10-CM | POA: Diagnosis not present

## 2012-06-23 DIAGNOSIS — M9981 Other biomechanical lesions of cervical region: Secondary | ICD-10-CM | POA: Diagnosis not present

## 2012-06-23 DIAGNOSIS — M999 Biomechanical lesion, unspecified: Secondary | ICD-10-CM | POA: Diagnosis not present

## 2012-06-23 DIAGNOSIS — M5137 Other intervertebral disc degeneration, lumbosacral region: Secondary | ICD-10-CM | POA: Diagnosis not present

## 2012-07-06 DIAGNOSIS — Z952 Presence of prosthetic heart valve: Secondary | ICD-10-CM | POA: Diagnosis not present

## 2012-07-06 DIAGNOSIS — I10 Essential (primary) hypertension: Secondary | ICD-10-CM | POA: Diagnosis not present

## 2012-07-25 DIAGNOSIS — M79609 Pain in unspecified limb: Secondary | ICD-10-CM | POA: Diagnosis not present

## 2012-07-25 DIAGNOSIS — R7309 Other abnormal glucose: Secondary | ICD-10-CM | POA: Diagnosis not present

## 2012-07-25 DIAGNOSIS — N182 Chronic kidney disease, stage 2 (mild): Secondary | ICD-10-CM | POA: Diagnosis not present

## 2012-07-25 DIAGNOSIS — Z125 Encounter for screening for malignant neoplasm of prostate: Secondary | ICD-10-CM | POA: Diagnosis not present

## 2012-07-25 DIAGNOSIS — I119 Hypertensive heart disease without heart failure: Secondary | ICD-10-CM | POA: Diagnosis not present

## 2012-07-25 DIAGNOSIS — Z79899 Other long term (current) drug therapy: Secondary | ICD-10-CM | POA: Diagnosis not present

## 2012-07-28 DIAGNOSIS — M999 Biomechanical lesion, unspecified: Secondary | ICD-10-CM | POA: Diagnosis not present

## 2012-07-28 DIAGNOSIS — IMO0002 Reserved for concepts with insufficient information to code with codable children: Secondary | ICD-10-CM | POA: Diagnosis not present

## 2012-08-04 DIAGNOSIS — IMO0002 Reserved for concepts with insufficient information to code with codable children: Secondary | ICD-10-CM | POA: Diagnosis not present

## 2012-08-04 DIAGNOSIS — M999 Biomechanical lesion, unspecified: Secondary | ICD-10-CM | POA: Diagnosis not present

## 2012-08-24 DIAGNOSIS — M5412 Radiculopathy, cervical region: Secondary | ICD-10-CM | POA: Diagnosis not present

## 2012-08-24 DIAGNOSIS — M9981 Other biomechanical lesions of cervical region: Secondary | ICD-10-CM | POA: Diagnosis not present

## 2012-08-24 DIAGNOSIS — M5137 Other intervertebral disc degeneration, lumbosacral region: Secondary | ICD-10-CM | POA: Diagnosis not present

## 2012-08-24 DIAGNOSIS — M999 Biomechanical lesion, unspecified: Secondary | ICD-10-CM | POA: Diagnosis not present

## 2012-08-25 DIAGNOSIS — M999 Biomechanical lesion, unspecified: Secondary | ICD-10-CM | POA: Diagnosis not present

## 2012-08-25 DIAGNOSIS — M5412 Radiculopathy, cervical region: Secondary | ICD-10-CM | POA: Diagnosis not present

## 2012-08-25 DIAGNOSIS — M9981 Other biomechanical lesions of cervical region: Secondary | ICD-10-CM | POA: Diagnosis not present

## 2012-08-25 DIAGNOSIS — M5137 Other intervertebral disc degeneration, lumbosacral region: Secondary | ICD-10-CM | POA: Diagnosis not present

## 2012-09-08 DIAGNOSIS — M9981 Other biomechanical lesions of cervical region: Secondary | ICD-10-CM | POA: Diagnosis not present

## 2012-09-08 DIAGNOSIS — M999 Biomechanical lesion, unspecified: Secondary | ICD-10-CM | POA: Diagnosis not present

## 2012-09-08 DIAGNOSIS — M5137 Other intervertebral disc degeneration, lumbosacral region: Secondary | ICD-10-CM | POA: Diagnosis not present

## 2012-09-08 DIAGNOSIS — IMO0002 Reserved for concepts with insufficient information to code with codable children: Secondary | ICD-10-CM | POA: Diagnosis not present

## 2012-09-08 DIAGNOSIS — M5412 Radiculopathy, cervical region: Secondary | ICD-10-CM | POA: Diagnosis not present

## 2012-09-14 DIAGNOSIS — M5412 Radiculopathy, cervical region: Secondary | ICD-10-CM | POA: Diagnosis not present

## 2012-09-14 DIAGNOSIS — M9981 Other biomechanical lesions of cervical region: Secondary | ICD-10-CM | POA: Diagnosis not present

## 2012-09-14 DIAGNOSIS — M999 Biomechanical lesion, unspecified: Secondary | ICD-10-CM | POA: Diagnosis not present

## 2012-09-14 DIAGNOSIS — M5137 Other intervertebral disc degeneration, lumbosacral region: Secondary | ICD-10-CM | POA: Diagnosis not present

## 2012-09-14 DIAGNOSIS — IMO0002 Reserved for concepts with insufficient information to code with codable children: Secondary | ICD-10-CM | POA: Diagnosis not present

## 2012-09-15 DIAGNOSIS — M999 Biomechanical lesion, unspecified: Secondary | ICD-10-CM | POA: Diagnosis not present

## 2012-09-15 DIAGNOSIS — M5412 Radiculopathy, cervical region: Secondary | ICD-10-CM | POA: Diagnosis not present

## 2012-09-15 DIAGNOSIS — IMO0002 Reserved for concepts with insufficient information to code with codable children: Secondary | ICD-10-CM | POA: Diagnosis not present

## 2012-09-15 DIAGNOSIS — M5137 Other intervertebral disc degeneration, lumbosacral region: Secondary | ICD-10-CM | POA: Diagnosis not present

## 2012-09-15 DIAGNOSIS — M9981 Other biomechanical lesions of cervical region: Secondary | ICD-10-CM | POA: Diagnosis not present

## 2012-09-28 DIAGNOSIS — M999 Biomechanical lesion, unspecified: Secondary | ICD-10-CM | POA: Diagnosis not present

## 2012-09-28 DIAGNOSIS — M9981 Other biomechanical lesions of cervical region: Secondary | ICD-10-CM | POA: Diagnosis not present

## 2012-09-28 DIAGNOSIS — M5137 Other intervertebral disc degeneration, lumbosacral region: Secondary | ICD-10-CM | POA: Diagnosis not present

## 2012-09-28 DIAGNOSIS — M5412 Radiculopathy, cervical region: Secondary | ICD-10-CM | POA: Diagnosis not present

## 2012-09-29 DIAGNOSIS — IMO0002 Reserved for concepts with insufficient information to code with codable children: Secondary | ICD-10-CM | POA: Diagnosis not present

## 2012-09-29 DIAGNOSIS — M9981 Other biomechanical lesions of cervical region: Secondary | ICD-10-CM | POA: Diagnosis not present

## 2012-09-29 DIAGNOSIS — M999 Biomechanical lesion, unspecified: Secondary | ICD-10-CM | POA: Diagnosis not present

## 2012-09-29 DIAGNOSIS — M5412 Radiculopathy, cervical region: Secondary | ICD-10-CM | POA: Diagnosis not present

## 2012-09-29 DIAGNOSIS — M5137 Other intervertebral disc degeneration, lumbosacral region: Secondary | ICD-10-CM | POA: Diagnosis not present

## 2012-10-13 DIAGNOSIS — M9981 Other biomechanical lesions of cervical region: Secondary | ICD-10-CM | POA: Diagnosis not present

## 2012-10-13 DIAGNOSIS — M5137 Other intervertebral disc degeneration, lumbosacral region: Secondary | ICD-10-CM | POA: Diagnosis not present

## 2012-10-13 DIAGNOSIS — M5412 Radiculopathy, cervical region: Secondary | ICD-10-CM | POA: Diagnosis not present

## 2012-10-13 DIAGNOSIS — M999 Biomechanical lesion, unspecified: Secondary | ICD-10-CM | POA: Diagnosis not present

## 2012-10-31 DIAGNOSIS — M47817 Spondylosis without myelopathy or radiculopathy, lumbosacral region: Secondary | ICD-10-CM | POA: Diagnosis not present

## 2012-11-16 DIAGNOSIS — H16009 Unspecified corneal ulcer, unspecified eye: Secondary | ICD-10-CM | POA: Diagnosis not present

## 2012-11-16 DIAGNOSIS — H04129 Dry eye syndrome of unspecified lacrimal gland: Secondary | ICD-10-CM | POA: Diagnosis not present

## 2012-11-17 DIAGNOSIS — IMO0002 Reserved for concepts with insufficient information to code with codable children: Secondary | ICD-10-CM | POA: Diagnosis not present

## 2012-11-17 DIAGNOSIS — M999 Biomechanical lesion, unspecified: Secondary | ICD-10-CM | POA: Diagnosis not present

## 2012-11-24 DIAGNOSIS — M999 Biomechanical lesion, unspecified: Secondary | ICD-10-CM | POA: Diagnosis not present

## 2012-11-24 DIAGNOSIS — IMO0002 Reserved for concepts with insufficient information to code with codable children: Secondary | ICD-10-CM | POA: Diagnosis not present

## 2012-11-29 DIAGNOSIS — M47817 Spondylosis without myelopathy or radiculopathy, lumbosacral region: Secondary | ICD-10-CM | POA: Diagnosis not present

## 2012-12-01 DIAGNOSIS — IMO0002 Reserved for concepts with insufficient information to code with codable children: Secondary | ICD-10-CM | POA: Diagnosis not present

## 2012-12-01 DIAGNOSIS — M999 Biomechanical lesion, unspecified: Secondary | ICD-10-CM | POA: Diagnosis not present

## 2012-12-05 DIAGNOSIS — R7309 Other abnormal glucose: Secondary | ICD-10-CM | POA: Diagnosis not present

## 2012-12-05 DIAGNOSIS — I119 Hypertensive heart disease without heart failure: Secondary | ICD-10-CM | POA: Diagnosis not present

## 2012-12-05 DIAGNOSIS — N183 Chronic kidney disease, stage 3 unspecified: Secondary | ICD-10-CM | POA: Diagnosis not present

## 2012-12-05 DIAGNOSIS — M19079 Primary osteoarthritis, unspecified ankle and foot: Secondary | ICD-10-CM | POA: Diagnosis not present

## 2012-12-12 IMAGING — CT CT CHEST W/O CM
2 of 3 series · 15 of 36 positions shown, 18 images · non-contrast
Comparison: 09/19/2011.

CLINICAL DATA: Visual pleural fluid.  Right thoracentesis
yesterday.

CT CHEST WITHOUT CONTRAST
TECHNIQUE: Multidetector CT imaging of the chest was performed
following the standard protocol without IV contrast.

[Series 2: routine chest 5.0 st · axial · 0.70mm/px · z∈[-238,+18]mm · 12 of 61 slices shown, 15 images]
[im 5/61  mediastinal]
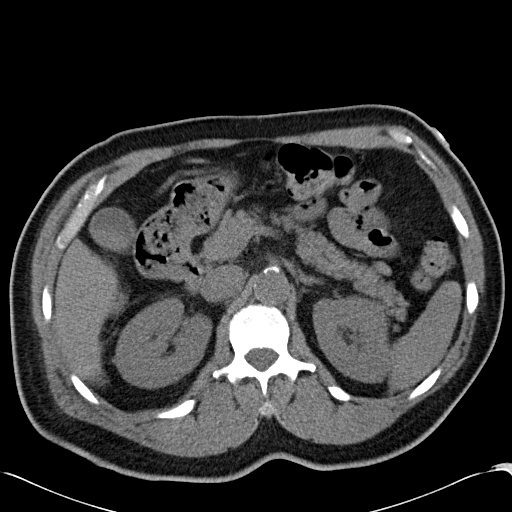
[im 5/61  lung]
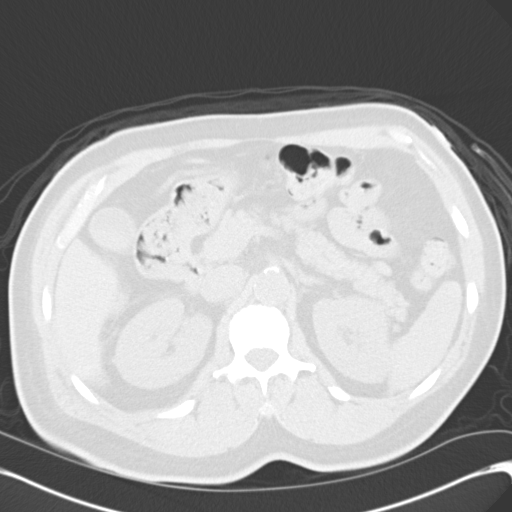
[im 9/61  lung]
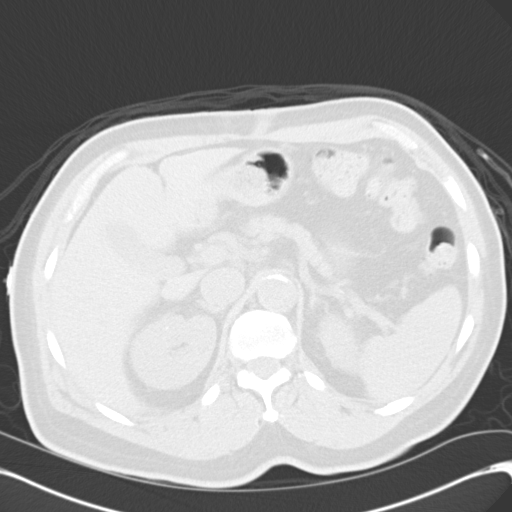
[im 14/61  lung]
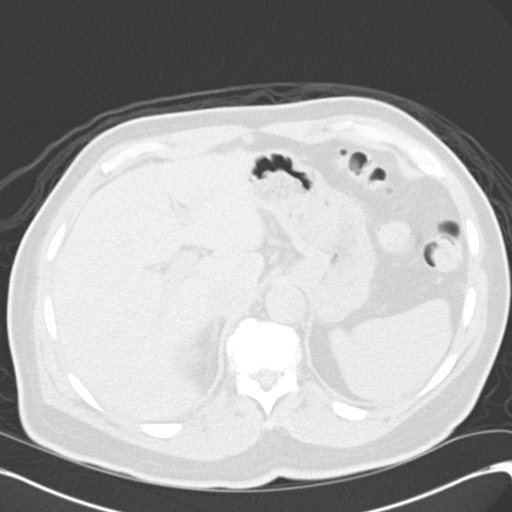
[im 18/61  lung]
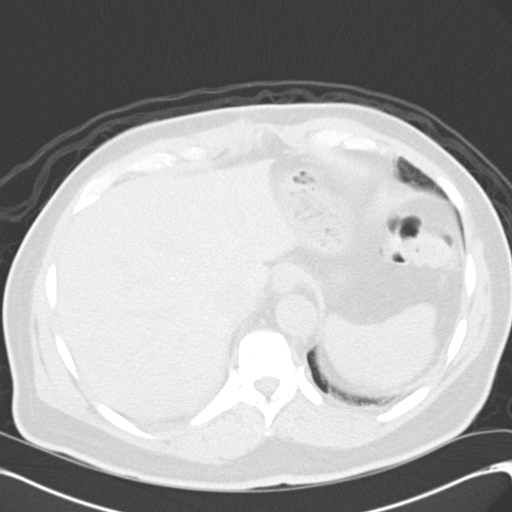
[im 23/61  mediastinal]
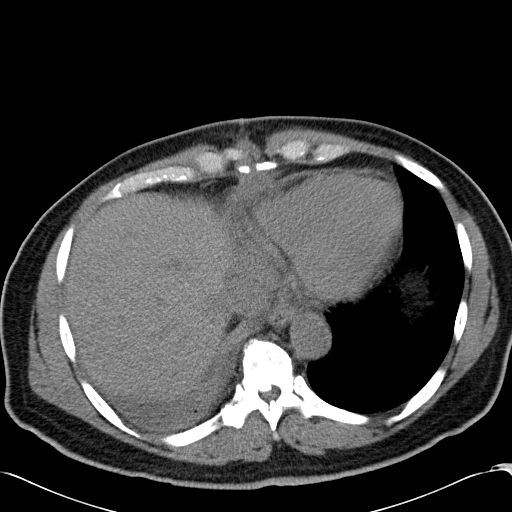
[im 23/61  lung]
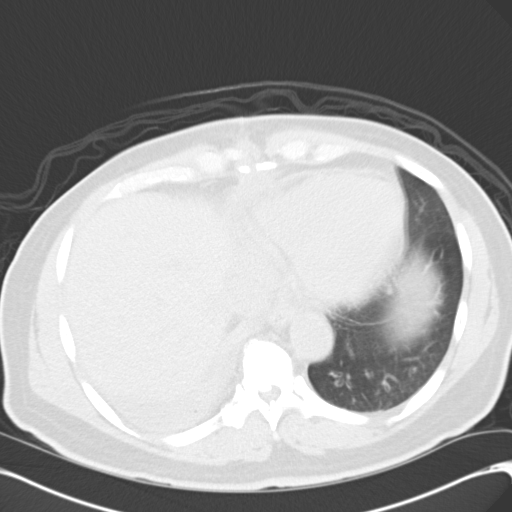
[im 27/61  lung]
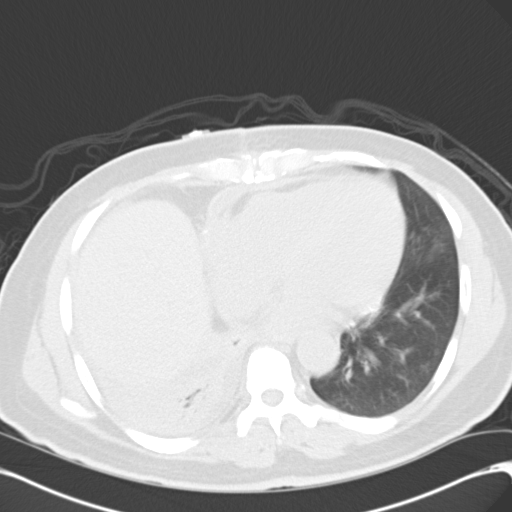
[im 34/61  lung]
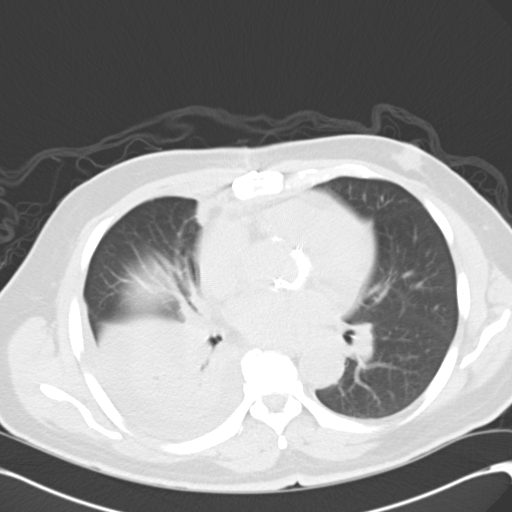
[im 38/61  lung]
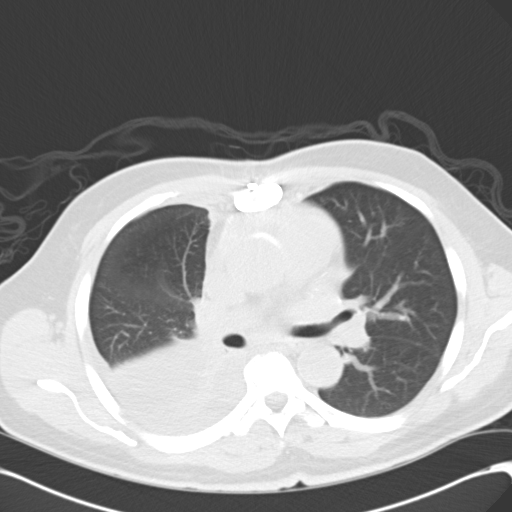
[im 43/61  mediastinal]
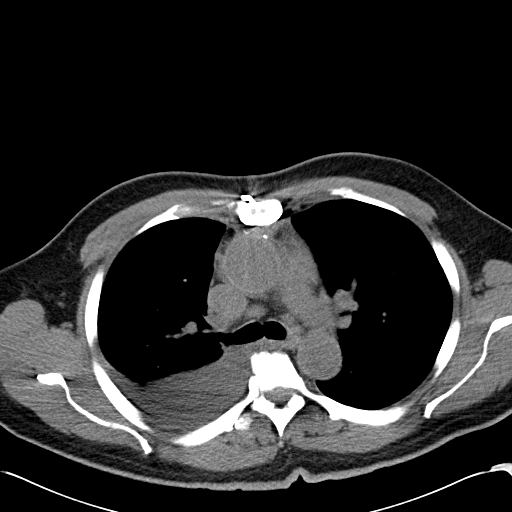
[im 43/61  lung]
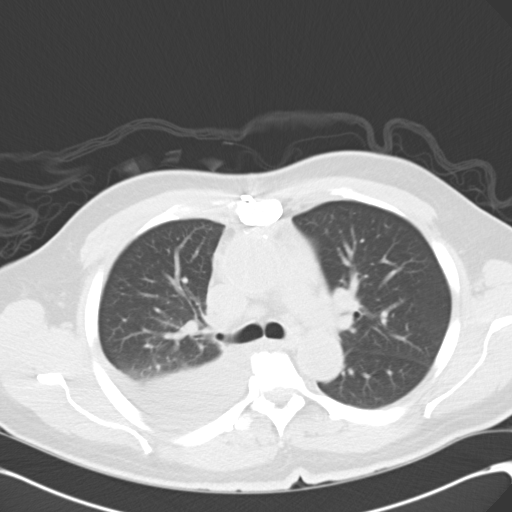
[im 47/61  lung]
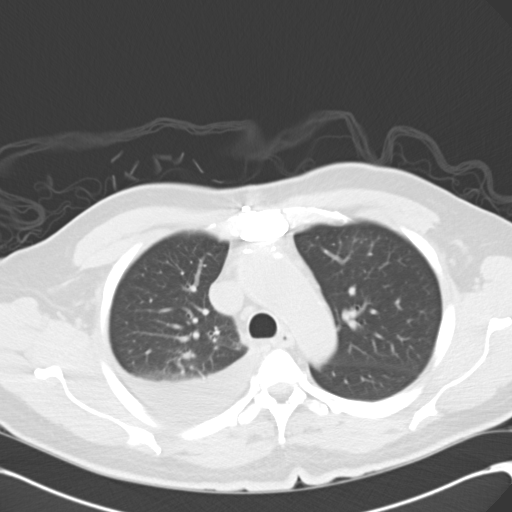
[im 52/61  lung]
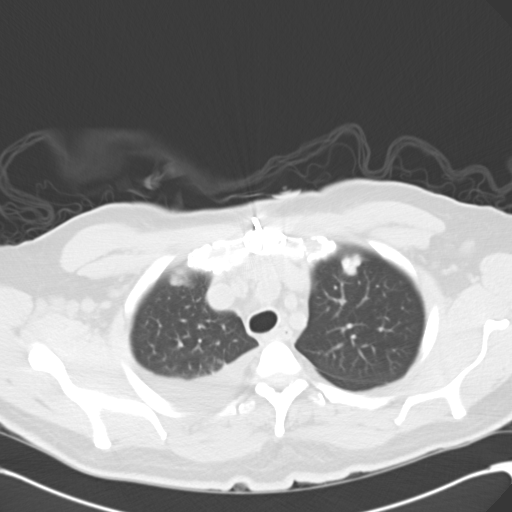
[im 56/61  lung]
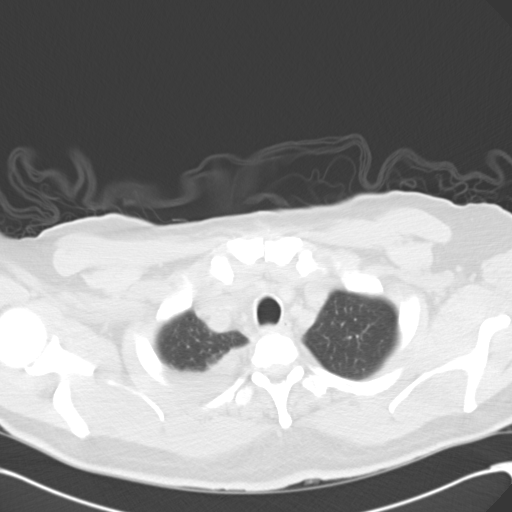

[Series 602: <mpr thick coronals · coronal · 0.70mm/px · 3 of 75 slices shown]
[im 15/75  lung]
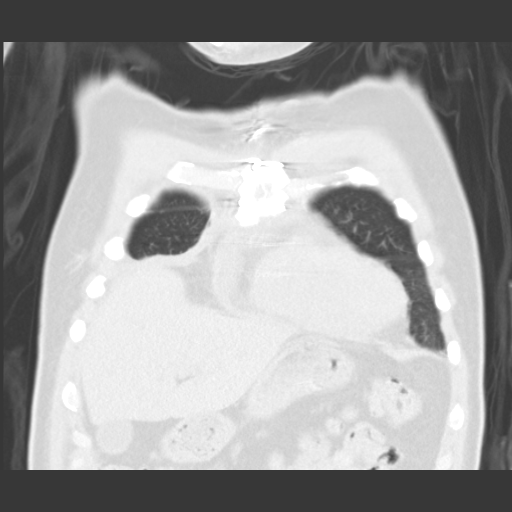
[im 30/75  lung]
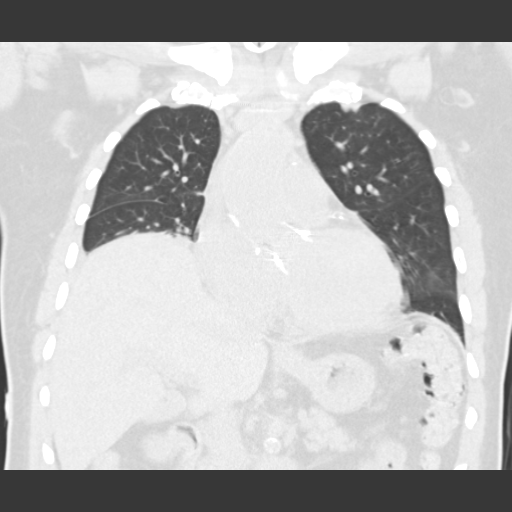
[im 45/75  lung]
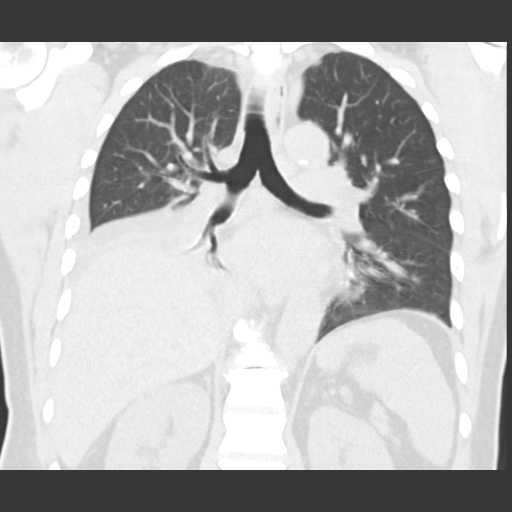

[15 of 36 positions shown; findings below may reference images not displayed]

FINDINGS: Small to moderate dependently layering right pleural
effusion is present.  There is consolidation of the right lower
lobe with air bronchograms, most compatible with pneumonia.
Bilateral gynecomastia is incidentally noted.  Postoperative
changes of median sternotomy. Mild pericardial fluid or thickening
is present.  Low attenuation of the intravascular compartment
suggesting anemia.  Incidental imaging of the upper abdomen is
within normal limits. Aortic valve replacement and CABG markers
noted.  The left lung demonstrates atelectasis.  Aerated portions
of the right lung demonstrate atelectasis. Thoracic spine
degenerative disease is present.  No destructive osseous lesions.
There is no axillary adenopathy.  Mediastinal lymph nodes are
present which may be reactive.
IMPRESSION: 1.  Small to moderate right pleural effusion layering posteriorly.
2.  Collapse / consolidation of the right lower lobe with air
bronchograms.
3.  Postoperative changes of aortic valve replacement and CABG.

## 2012-12-29 DIAGNOSIS — M5137 Other intervertebral disc degeneration, lumbosacral region: Secondary | ICD-10-CM | POA: Diagnosis not present

## 2012-12-29 DIAGNOSIS — M999 Biomechanical lesion, unspecified: Secondary | ICD-10-CM | POA: Diagnosis not present

## 2012-12-29 DIAGNOSIS — M5412 Radiculopathy, cervical region: Secondary | ICD-10-CM | POA: Diagnosis not present

## 2012-12-29 DIAGNOSIS — M9981 Other biomechanical lesions of cervical region: Secondary | ICD-10-CM | POA: Diagnosis not present

## 2013-01-12 DIAGNOSIS — M5137 Other intervertebral disc degeneration, lumbosacral region: Secondary | ICD-10-CM | POA: Diagnosis not present

## 2013-01-12 DIAGNOSIS — M999 Biomechanical lesion, unspecified: Secondary | ICD-10-CM | POA: Diagnosis not present

## 2013-01-12 DIAGNOSIS — M9981 Other biomechanical lesions of cervical region: Secondary | ICD-10-CM | POA: Diagnosis not present

## 2013-01-12 DIAGNOSIS — M5412 Radiculopathy, cervical region: Secondary | ICD-10-CM | POA: Diagnosis not present

## 2013-01-19 DIAGNOSIS — IMO0002 Reserved for concepts with insufficient information to code with codable children: Secondary | ICD-10-CM | POA: Diagnosis not present

## 2013-01-19 DIAGNOSIS — M999 Biomechanical lesion, unspecified: Secondary | ICD-10-CM | POA: Diagnosis not present

## 2013-01-22 DIAGNOSIS — IMO0002 Reserved for concepts with insufficient information to code with codable children: Secondary | ICD-10-CM | POA: Diagnosis not present

## 2013-01-22 DIAGNOSIS — M999 Biomechanical lesion, unspecified: Secondary | ICD-10-CM | POA: Diagnosis not present

## 2013-02-05 DIAGNOSIS — IMO0002 Reserved for concepts with insufficient information to code with codable children: Secondary | ICD-10-CM | POA: Diagnosis not present

## 2013-02-05 DIAGNOSIS — M999 Biomechanical lesion, unspecified: Secondary | ICD-10-CM | POA: Diagnosis not present

## 2013-02-09 DIAGNOSIS — IMO0002 Reserved for concepts with insufficient information to code with codable children: Secondary | ICD-10-CM | POA: Diagnosis not present

## 2013-02-09 DIAGNOSIS — M999 Biomechanical lesion, unspecified: Secondary | ICD-10-CM | POA: Diagnosis not present

## 2013-02-19 DIAGNOSIS — M999 Biomechanical lesion, unspecified: Secondary | ICD-10-CM | POA: Diagnosis not present

## 2013-02-19 DIAGNOSIS — IMO0002 Reserved for concepts with insufficient information to code with codable children: Secondary | ICD-10-CM | POA: Diagnosis not present

## 2013-02-26 DIAGNOSIS — R350 Frequency of micturition: Secondary | ICD-10-CM | POA: Diagnosis not present

## 2013-02-26 DIAGNOSIS — N138 Other obstructive and reflux uropathy: Secondary | ICD-10-CM | POA: Diagnosis not present

## 2013-02-26 DIAGNOSIS — N401 Enlarged prostate with lower urinary tract symptoms: Secondary | ICD-10-CM | POA: Diagnosis not present

## 2013-03-02 DIAGNOSIS — M999 Biomechanical lesion, unspecified: Secondary | ICD-10-CM | POA: Diagnosis not present

## 2013-03-02 DIAGNOSIS — IMO0002 Reserved for concepts with insufficient information to code with codable children: Secondary | ICD-10-CM | POA: Diagnosis not present

## 2013-03-09 ENCOUNTER — Telehealth: Payer: Self-pay

## 2013-03-09 DIAGNOSIS — M5137 Other intervertebral disc degeneration, lumbosacral region: Secondary | ICD-10-CM | POA: Diagnosis not present

## 2013-03-09 DIAGNOSIS — M999 Biomechanical lesion, unspecified: Secondary | ICD-10-CM | POA: Diagnosis not present

## 2013-03-09 DIAGNOSIS — M9981 Other biomechanical lesions of cervical region: Secondary | ICD-10-CM | POA: Diagnosis not present

## 2013-03-09 DIAGNOSIS — M5030 Other cervical disc degeneration, unspecified cervical region: Secondary | ICD-10-CM | POA: Diagnosis not present

## 2013-03-09 MED ORDER — LOSARTAN POTASSIUM-HCTZ 50-12.5 MG PO TABS: 1.0000 | ORAL_TABLET | Freq: Every day | ORAL | Status: DC

## 2013-03-09 NOTE — Telephone Encounter (Signed)
eceived refill rqst for pt [name] pt has not been seen since 01/2012. called a lmom for pt to call and sch a f/u appt.will refill [name] 30 R-0

## 2013-03-12 ENCOUNTER — Telehealth: Payer: Self-pay | Admitting: *Deleted

## 2013-03-12 MED ORDER — LOSARTAN POTASSIUM-HCTZ 50-12.5 MG PO TABS: 1.0000 | ORAL_TABLET | Freq: Every day | ORAL | Status: DC

## 2013-03-12 NOTE — Telephone Encounter (Signed)
[facility] sent prior authorization for for hyzaar, called them and if generic losartin-HCTZ is order will not need prior authorization, called [facility] they have not received order, will reorder with generic

## 2013-04-12 DIAGNOSIS — N401 Enlarged prostate with lower urinary tract symptoms: Secondary | ICD-10-CM | POA: Diagnosis not present

## 2013-04-12 DIAGNOSIS — N138 Other obstructive and reflux uropathy: Secondary | ICD-10-CM | POA: Diagnosis not present

## 2013-04-12 DIAGNOSIS — N139 Obstructive and reflux uropathy, unspecified: Secondary | ICD-10-CM | POA: Diagnosis not present

## 2013-04-17 DIAGNOSIS — R7309 Other abnormal glucose: Secondary | ICD-10-CM | POA: Diagnosis not present

## 2013-04-17 DIAGNOSIS — M5137 Other intervertebral disc degeneration, lumbosacral region: Secondary | ICD-10-CM | POA: Diagnosis not present

## 2013-04-17 DIAGNOSIS — M109 Gout, unspecified: Secondary | ICD-10-CM | POA: Diagnosis not present

## 2013-04-17 DIAGNOSIS — E7800 Pure hypercholesterolemia, unspecified: Secondary | ICD-10-CM | POA: Diagnosis not present

## 2013-04-17 DIAGNOSIS — I119 Hypertensive heart disease without heart failure: Secondary | ICD-10-CM | POA: Diagnosis not present

## 2013-04-17 DIAGNOSIS — N183 Chronic kidney disease, stage 3 unspecified: Secondary | ICD-10-CM | POA: Diagnosis not present

## 2013-04-21 ENCOUNTER — Encounter: Payer: Self-pay | Admitting: Interventional Cardiology

## 2013-04-23 ENCOUNTER — Ambulatory Visit (INDEPENDENT_AMBULATORY_CARE_PROVIDER_SITE_OTHER): Payer: Medicare Other | Admitting: Interventional Cardiology

## 2013-04-23 ENCOUNTER — Encounter: Payer: Self-pay | Admitting: Interventional Cardiology

## 2013-04-23 VITALS — BP 102/78 | HR 60 | Ht 70.0 in | Wt 198.0 lb

## 2013-04-23 DIAGNOSIS — I2510 Atherosclerotic heart disease of native coronary artery without angina pectoris: Secondary | ICD-10-CM | POA: Diagnosis not present

## 2013-04-23 DIAGNOSIS — I2699 Other pulmonary embolism without acute cor pulmonale: Secondary | ICD-10-CM

## 2013-04-23 DIAGNOSIS — Z954 Presence of other heart-valve replacement: Secondary | ICD-10-CM | POA: Diagnosis not present

## 2013-04-23 DIAGNOSIS — I10 Essential (primary) hypertension: Secondary | ICD-10-CM | POA: Diagnosis not present

## 2013-04-23 DIAGNOSIS — Z952 Presence of prosthetic heart valve: Secondary | ICD-10-CM

## 2013-04-23 NOTE — Patient Instructions (Signed)
Your physician recommends that you continue on your current medications as directed. Please refer to the Current Medication list given to you today.  Your physician wants you to follow-up in: 1 year. You will receive a reminder letter in the mail two months in advance. If you don't receive a letter, please call our office to schedule the follow-up appointment.  
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
## 2013-04-27 DIAGNOSIS — M5030 Other cervical disc degeneration, unspecified cervical region: Secondary | ICD-10-CM | POA: Diagnosis not present

## 2013-04-27 DIAGNOSIS — M5137 Other intervertebral disc degeneration, lumbosacral region: Secondary | ICD-10-CM | POA: Diagnosis not present

## 2013-04-27 DIAGNOSIS — M9981 Other biomechanical lesions of cervical region: Secondary | ICD-10-CM | POA: Diagnosis not present

## 2013-04-27 DIAGNOSIS — M999 Biomechanical lesion, unspecified: Secondary | ICD-10-CM | POA: Diagnosis not present

## 2013-05-25 DIAGNOSIS — M545 Low back pain, unspecified: Secondary | ICD-10-CM | POA: Diagnosis not present

## 2013-05-25 DIAGNOSIS — M542 Cervicalgia: Secondary | ICD-10-CM | POA: Diagnosis not present

## 2013-05-25 DIAGNOSIS — M5137 Other intervertebral disc degeneration, lumbosacral region: Secondary | ICD-10-CM | POA: Diagnosis not present

## 2013-05-25 DIAGNOSIS — M999 Biomechanical lesion, unspecified: Secondary | ICD-10-CM | POA: Diagnosis not present

## 2013-05-25 DIAGNOSIS — M62838 Other muscle spasm: Secondary | ICD-10-CM | POA: Diagnosis not present

## 2013-06-01 DIAGNOSIS — M5137 Other intervertebral disc degeneration, lumbosacral region: Secondary | ICD-10-CM | POA: Diagnosis not present

## 2013-06-01 DIAGNOSIS — M545 Low back pain, unspecified: Secondary | ICD-10-CM | POA: Diagnosis not present

## 2013-06-01 DIAGNOSIS — M542 Cervicalgia: Secondary | ICD-10-CM | POA: Diagnosis not present

## 2013-06-01 DIAGNOSIS — IMO0002 Reserved for concepts with insufficient information to code with codable children: Secondary | ICD-10-CM | POA: Diagnosis not present

## 2013-06-01 DIAGNOSIS — M62838 Other muscle spasm: Secondary | ICD-10-CM | POA: Diagnosis not present

## 2013-06-01 DIAGNOSIS — M999 Biomechanical lesion, unspecified: Secondary | ICD-10-CM | POA: Diagnosis not present

## 2013-06-15 DIAGNOSIS — M542 Cervicalgia: Secondary | ICD-10-CM | POA: Diagnosis not present

## 2013-06-15 DIAGNOSIS — M999 Biomechanical lesion, unspecified: Secondary | ICD-10-CM | POA: Diagnosis not present

## 2013-06-15 DIAGNOSIS — M62838 Other muscle spasm: Secondary | ICD-10-CM | POA: Diagnosis not present

## 2013-06-15 DIAGNOSIS — M545 Low back pain, unspecified: Secondary | ICD-10-CM | POA: Diagnosis not present

## 2013-06-15 DIAGNOSIS — M5137 Other intervertebral disc degeneration, lumbosacral region: Secondary | ICD-10-CM | POA: Diagnosis not present

## 2013-07-06 DIAGNOSIS — M9981 Other biomechanical lesions of cervical region: Secondary | ICD-10-CM | POA: Diagnosis not present

## 2013-07-06 DIAGNOSIS — M62838 Other muscle spasm: Secondary | ICD-10-CM | POA: Diagnosis not present

## 2013-07-06 DIAGNOSIS — M5412 Radiculopathy, cervical region: Secondary | ICD-10-CM | POA: Diagnosis not present

## 2013-07-06 DIAGNOSIS — M5137 Other intervertebral disc degeneration, lumbosacral region: Secondary | ICD-10-CM | POA: Diagnosis not present

## 2013-07-06 DIAGNOSIS — IMO0002 Reserved for concepts with insufficient information to code with codable children: Secondary | ICD-10-CM | POA: Diagnosis not present

## 2013-07-06 DIAGNOSIS — M545 Low back pain, unspecified: Secondary | ICD-10-CM | POA: Diagnosis not present

## 2013-07-06 DIAGNOSIS — M542 Cervicalgia: Secondary | ICD-10-CM | POA: Diagnosis not present

## 2013-07-06 DIAGNOSIS — M999 Biomechanical lesion, unspecified: Secondary | ICD-10-CM | POA: Diagnosis not present

## 2013-07-09 DIAGNOSIS — M545 Low back pain, unspecified: Secondary | ICD-10-CM | POA: Diagnosis not present

## 2013-07-09 DIAGNOSIS — M62838 Other muscle spasm: Secondary | ICD-10-CM | POA: Diagnosis not present

## 2013-07-09 DIAGNOSIS — M9981 Other biomechanical lesions of cervical region: Secondary | ICD-10-CM | POA: Diagnosis not present

## 2013-07-09 DIAGNOSIS — M999 Biomechanical lesion, unspecified: Secondary | ICD-10-CM | POA: Diagnosis not present

## 2013-07-09 DIAGNOSIS — M5412 Radiculopathy, cervical region: Secondary | ICD-10-CM | POA: Diagnosis not present

## 2013-07-09 DIAGNOSIS — IMO0002 Reserved for concepts with insufficient information to code with codable children: Secondary | ICD-10-CM | POA: Diagnosis not present

## 2013-07-09 DIAGNOSIS — M542 Cervicalgia: Secondary | ICD-10-CM | POA: Diagnosis not present

## 2013-07-09 DIAGNOSIS — M5137 Other intervertebral disc degeneration, lumbosacral region: Secondary | ICD-10-CM | POA: Diagnosis not present

## 2013-07-20 DIAGNOSIS — M545 Low back pain, unspecified: Secondary | ICD-10-CM | POA: Diagnosis not present

## 2013-07-20 DIAGNOSIS — M999 Biomechanical lesion, unspecified: Secondary | ICD-10-CM | POA: Diagnosis not present

## 2013-07-20 DIAGNOSIS — M62838 Other muscle spasm: Secondary | ICD-10-CM | POA: Diagnosis not present

## 2013-07-20 DIAGNOSIS — M542 Cervicalgia: Secondary | ICD-10-CM | POA: Diagnosis not present

## 2013-07-20 DIAGNOSIS — M5137 Other intervertebral disc degeneration, lumbosacral region: Secondary | ICD-10-CM | POA: Diagnosis not present

## 2013-07-23 DIAGNOSIS — M545 Low back pain, unspecified: Secondary | ICD-10-CM | POA: Diagnosis not present

## 2013-07-23 DIAGNOSIS — M542 Cervicalgia: Secondary | ICD-10-CM | POA: Diagnosis not present

## 2013-07-23 DIAGNOSIS — M999 Biomechanical lesion, unspecified: Secondary | ICD-10-CM | POA: Diagnosis not present

## 2013-07-23 DIAGNOSIS — M5137 Other intervertebral disc degeneration, lumbosacral region: Secondary | ICD-10-CM | POA: Diagnosis not present

## 2013-07-23 DIAGNOSIS — M62838 Other muscle spasm: Secondary | ICD-10-CM | POA: Diagnosis not present

## 2013-08-03 DIAGNOSIS — M545 Low back pain, unspecified: Secondary | ICD-10-CM | POA: Diagnosis not present

## 2013-08-03 DIAGNOSIS — M999 Biomechanical lesion, unspecified: Secondary | ICD-10-CM | POA: Diagnosis not present

## 2013-08-03 DIAGNOSIS — M62838 Other muscle spasm: Secondary | ICD-10-CM | POA: Diagnosis not present

## 2013-08-03 DIAGNOSIS — M5137 Other intervertebral disc degeneration, lumbosacral region: Secondary | ICD-10-CM | POA: Diagnosis not present

## 2013-08-03 DIAGNOSIS — M542 Cervicalgia: Secondary | ICD-10-CM | POA: Diagnosis not present

## 2013-08-24 DIAGNOSIS — M62838 Other muscle spasm: Secondary | ICD-10-CM | POA: Diagnosis not present

## 2013-08-24 DIAGNOSIS — M542 Cervicalgia: Secondary | ICD-10-CM | POA: Diagnosis not present

## 2013-08-24 DIAGNOSIS — M545 Low back pain, unspecified: Secondary | ICD-10-CM | POA: Diagnosis not present

## 2013-08-24 DIAGNOSIS — M5137 Other intervertebral disc degeneration, lumbosacral region: Secondary | ICD-10-CM | POA: Diagnosis not present

## 2013-08-24 DIAGNOSIS — M999 Biomechanical lesion, unspecified: Secondary | ICD-10-CM | POA: Diagnosis not present

## 2013-08-28 DIAGNOSIS — Z952 Presence of prosthetic heart valve: Secondary | ICD-10-CM | POA: Diagnosis not present

## 2013-08-28 DIAGNOSIS — N183 Chronic kidney disease, stage 3 unspecified: Secondary | ICD-10-CM | POA: Diagnosis not present

## 2013-08-28 DIAGNOSIS — I119 Hypertensive heart disease without heart failure: Secondary | ICD-10-CM | POA: Diagnosis not present

## 2013-08-28 DIAGNOSIS — IMO0001 Reserved for inherently not codable concepts without codable children: Secondary | ICD-10-CM | POA: Diagnosis not present

## 2013-08-28 DIAGNOSIS — B356 Tinea cruris: Secondary | ICD-10-CM | POA: Diagnosis not present

## 2013-08-31 DIAGNOSIS — M5137 Other intervertebral disc degeneration, lumbosacral region: Secondary | ICD-10-CM | POA: Diagnosis not present

## 2013-08-31 DIAGNOSIS — M542 Cervicalgia: Secondary | ICD-10-CM | POA: Diagnosis not present

## 2013-08-31 DIAGNOSIS — M999 Biomechanical lesion, unspecified: Secondary | ICD-10-CM | POA: Diagnosis not present

## 2013-08-31 DIAGNOSIS — M545 Low back pain, unspecified: Secondary | ICD-10-CM | POA: Diagnosis not present

## 2013-08-31 DIAGNOSIS — M62838 Other muscle spasm: Secondary | ICD-10-CM | POA: Diagnosis not present

## 2013-09-14 DIAGNOSIS — M545 Low back pain, unspecified: Secondary | ICD-10-CM | POA: Diagnosis not present

## 2013-09-14 DIAGNOSIS — M62838 Other muscle spasm: Secondary | ICD-10-CM | POA: Diagnosis not present

## 2013-09-14 DIAGNOSIS — M999 Biomechanical lesion, unspecified: Secondary | ICD-10-CM | POA: Diagnosis not present

## 2013-09-14 DIAGNOSIS — M5137 Other intervertebral disc degeneration, lumbosacral region: Secondary | ICD-10-CM | POA: Diagnosis not present

## 2013-09-14 DIAGNOSIS — M542 Cervicalgia: Secondary | ICD-10-CM | POA: Diagnosis not present

## 2013-09-17 DIAGNOSIS — M545 Low back pain, unspecified: Secondary | ICD-10-CM | POA: Diagnosis not present

## 2013-09-17 DIAGNOSIS — M542 Cervicalgia: Secondary | ICD-10-CM | POA: Diagnosis not present

## 2013-09-17 DIAGNOSIS — M62838 Other muscle spasm: Secondary | ICD-10-CM | POA: Diagnosis not present

## 2013-09-17 DIAGNOSIS — M999 Biomechanical lesion, unspecified: Secondary | ICD-10-CM | POA: Diagnosis not present

## 2013-09-17 DIAGNOSIS — M5137 Other intervertebral disc degeneration, lumbosacral region: Secondary | ICD-10-CM | POA: Diagnosis not present

## 2013-10-05 DIAGNOSIS — M62838 Other muscle spasm: Secondary | ICD-10-CM | POA: Diagnosis not present

## 2013-10-05 DIAGNOSIS — M542 Cervicalgia: Secondary | ICD-10-CM | POA: Diagnosis not present

## 2013-10-05 DIAGNOSIS — M5137 Other intervertebral disc degeneration, lumbosacral region: Secondary | ICD-10-CM | POA: Diagnosis not present

## 2013-10-05 DIAGNOSIS — M545 Low back pain, unspecified: Secondary | ICD-10-CM | POA: Diagnosis not present

## 2013-10-05 DIAGNOSIS — M999 Biomechanical lesion, unspecified: Secondary | ICD-10-CM | POA: Diagnosis not present

## 2013-10-12 DIAGNOSIS — M9981 Other biomechanical lesions of cervical region: Secondary | ICD-10-CM | POA: Diagnosis not present

## 2013-10-12 DIAGNOSIS — S339XXA Sprain of unspecified parts of lumbar spine and pelvis, initial encounter: Secondary | ICD-10-CM | POA: Diagnosis not present

## 2013-10-12 DIAGNOSIS — M999 Biomechanical lesion, unspecified: Secondary | ICD-10-CM | POA: Diagnosis not present

## 2013-10-12 DIAGNOSIS — S139XXA Sprain of joints and ligaments of unspecified parts of neck, initial encounter: Secondary | ICD-10-CM | POA: Diagnosis not present

## 2013-10-30 DIAGNOSIS — H2589 Other age-related cataract: Secondary | ICD-10-CM | POA: Diagnosis not present

## 2013-10-30 DIAGNOSIS — H16229 Keratoconjunctivitis sicca, not specified as Sjogren's, unspecified eye: Secondary | ICD-10-CM | POA: Diagnosis not present

## 2013-11-02 DIAGNOSIS — M999 Biomechanical lesion, unspecified: Secondary | ICD-10-CM | POA: Diagnosis not present

## 2013-11-02 DIAGNOSIS — S139XXA Sprain of joints and ligaments of unspecified parts of neck, initial encounter: Secondary | ICD-10-CM | POA: Diagnosis not present

## 2013-11-02 DIAGNOSIS — S339XXA Sprain of unspecified parts of lumbar spine and pelvis, initial encounter: Secondary | ICD-10-CM | POA: Diagnosis not present

## 2013-11-02 DIAGNOSIS — M9981 Other biomechanical lesions of cervical region: Secondary | ICD-10-CM | POA: Diagnosis not present

## 2013-11-08 DIAGNOSIS — M999 Biomechanical lesion, unspecified: Secondary | ICD-10-CM | POA: Diagnosis not present

## 2013-11-08 DIAGNOSIS — S339XXA Sprain of unspecified parts of lumbar spine and pelvis, initial encounter: Secondary | ICD-10-CM | POA: Diagnosis not present

## 2013-11-08 DIAGNOSIS — M9981 Other biomechanical lesions of cervical region: Secondary | ICD-10-CM | POA: Diagnosis not present

## 2013-11-08 DIAGNOSIS — S139XXA Sprain of joints and ligaments of unspecified parts of neck, initial encounter: Secondary | ICD-10-CM | POA: Diagnosis not present

## 2013-11-12 DIAGNOSIS — S339XXA Sprain of unspecified parts of lumbar spine and pelvis, initial encounter: Secondary | ICD-10-CM | POA: Diagnosis not present

## 2013-11-12 DIAGNOSIS — M9981 Other biomechanical lesions of cervical region: Secondary | ICD-10-CM | POA: Diagnosis not present

## 2013-11-12 DIAGNOSIS — M999 Biomechanical lesion, unspecified: Secondary | ICD-10-CM | POA: Diagnosis not present

## 2013-11-12 DIAGNOSIS — S139XXA Sprain of joints and ligaments of unspecified parts of neck, initial encounter: Secondary | ICD-10-CM | POA: Diagnosis not present

## 2013-11-29 DIAGNOSIS — N410 Acute prostatitis: Secondary | ICD-10-CM | POA: Diagnosis not present

## 2013-11-30 DIAGNOSIS — M9981 Other biomechanical lesions of cervical region: Secondary | ICD-10-CM | POA: Diagnosis not present

## 2013-11-30 DIAGNOSIS — S339XXA Sprain of unspecified parts of lumbar spine and pelvis, initial encounter: Secondary | ICD-10-CM | POA: Diagnosis not present

## 2013-11-30 DIAGNOSIS — S139XXA Sprain of joints and ligaments of unspecified parts of neck, initial encounter: Secondary | ICD-10-CM | POA: Diagnosis not present

## 2013-11-30 DIAGNOSIS — M999 Biomechanical lesion, unspecified: Secondary | ICD-10-CM | POA: Diagnosis not present

## 2013-12-18 DIAGNOSIS — N419 Inflammatory disease of prostate, unspecified: Secondary | ICD-10-CM | POA: Diagnosis not present

## 2013-12-18 DIAGNOSIS — K648 Other hemorrhoids: Secondary | ICD-10-CM | POA: Diagnosis not present

## 2013-12-18 DIAGNOSIS — K625 Hemorrhage of anus and rectum: Secondary | ICD-10-CM | POA: Diagnosis not present

## 2013-12-18 DIAGNOSIS — M2550 Pain in unspecified joint: Secondary | ICD-10-CM | POA: Diagnosis not present

## 2014-01-09 ENCOUNTER — Encounter (HOSPITAL_COMMUNITY): Payer: Self-pay | Admitting: Emergency Medicine

## 2014-01-09 ENCOUNTER — Emergency Department (HOSPITAL_COMMUNITY)
Admission: EM | Admit: 2014-01-09 | Discharge: 2014-01-09 | Disposition: A | Discharge status: Home / Self Care | Payer: Medicare Other | Attending: Emergency Medicine | Admitting: Emergency Medicine

## 2014-01-09 ENCOUNTER — Emergency Department (HOSPITAL_COMMUNITY): Payer: Medicare Other

## 2014-01-09 DIAGNOSIS — M79609 Pain in unspecified limb: Secondary | ICD-10-CM | POA: Diagnosis not present

## 2014-01-09 DIAGNOSIS — M25539 Pain in unspecified wrist: Secondary | ICD-10-CM | POA: Diagnosis not present

## 2014-01-09 DIAGNOSIS — I10 Essential (primary) hypertension: Secondary | ICD-10-CM | POA: Diagnosis not present

## 2014-01-09 DIAGNOSIS — R05 Cough: Secondary | ICD-10-CM | POA: Diagnosis not present

## 2014-01-09 DIAGNOSIS — Z7982 Long term (current) use of aspirin: Secondary | ICD-10-CM | POA: Insufficient documentation

## 2014-01-09 DIAGNOSIS — Z79899 Other long term (current) drug therapy: Secondary | ICD-10-CM | POA: Diagnosis not present

## 2014-01-09 DIAGNOSIS — Z8659 Personal history of other mental and behavioral disorders: Secondary | ICD-10-CM | POA: Diagnosis not present

## 2014-01-09 DIAGNOSIS — R059 Cough, unspecified: Secondary | ICD-10-CM | POA: Insufficient documentation

## 2014-01-09 DIAGNOSIS — M25532 Pain in left wrist: Secondary | ICD-10-CM

## 2014-01-09 DIAGNOSIS — E785 Hyperlipidemia, unspecified: Secondary | ICD-10-CM | POA: Insufficient documentation

## 2014-01-09 DIAGNOSIS — M11239 Other chondrocalcinosis, unspecified wrist: Secondary | ICD-10-CM | POA: Diagnosis not present

## 2014-01-09 DIAGNOSIS — Z87448 Personal history of other diseases of urinary system: Secondary | ICD-10-CM | POA: Insufficient documentation

## 2014-01-09 DIAGNOSIS — Z872 Personal history of diseases of the skin and subcutaneous tissue: Secondary | ICD-10-CM | POA: Diagnosis not present

## 2014-01-09 DIAGNOSIS — Z8701 Personal history of pneumonia (recurrent): Secondary | ICD-10-CM | POA: Diagnosis not present

## 2014-01-09 DIAGNOSIS — I2510 Atherosclerotic heart disease of native coronary artery without angina pectoris: Secondary | ICD-10-CM | POA: Insufficient documentation

## 2014-01-09 DIAGNOSIS — R011 Cardiac murmur, unspecified: Secondary | ICD-10-CM | POA: Insufficient documentation

## 2014-01-09 LAB — I-STAT CHEM 8, ED
BUN: 21 mg/dL (ref 6–23)
CALCIUM ION: 1.23 mmol/L (ref 1.13–1.30)
CREATININE: 1.4 mg/dL — AB (ref 0.50–1.35)
Chloride: 103 mEq/L (ref 96–112)
GLUCOSE: 108 mg/dL — AB (ref 70–99)
HCT: 43 % (ref 39.0–52.0)
HEMOGLOBIN: 14.6 g/dL (ref 13.0–17.0)
Potassium: 3.5 mEq/L — ABNORMAL LOW (ref 3.7–5.3)
Sodium: 139 mEq/L (ref 137–147)
TCO2: 25 mmol/L (ref 0–100)

## 2014-01-09 MED ORDER — HYDROCODONE-ACETAMINOPHEN 5-325 MG PO TABS: 1.0000 | ORAL_TABLET | Freq: Four times a day (QID) | ORAL | Status: DC | PRN

## 2014-01-09 MED ORDER — HYDROCODONE-ACETAMINOPHEN 5-325 MG PO TABS
1.0000 | ORAL_TABLET | Freq: Once | ORAL | Status: AC
  Administered 2014-01-09: 1 via ORAL
  Filled 2014-01-09: qty 1
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
## 2014-01-09 NOTE — ED Notes (Signed)
Ortho paged for hand splint

## 2014-01-09 NOTE — ED Notes (Addendum)
Pt c/o left wrist pain that started yesterday evening while sitting down and watching TV.  Denies any injuries that could have caused pain. Pt also stated that past couple of nights has been waking up coughing and has to sit up to get some relief. Cough is non productive.

## 2014-01-09 NOTE — ED Provider Notes (Signed)
Medical screening examination/treatment/procedure(s) were performed by non-physician practitioner and as supervising physician I was immediately available for consultation/collaboration.  Megan Docherty, MD 01/09/14 1645 

## 2014-01-09 NOTE — Discharge Instructions (Signed)

## 2014-01-18 DIAGNOSIS — M545 Low back pain, unspecified: Secondary | ICD-10-CM | POA: Diagnosis not present

## 2014-01-18 DIAGNOSIS — M5137 Other intervertebral disc degeneration, lumbosacral region: Secondary | ICD-10-CM | POA: Diagnosis not present

## 2014-01-18 DIAGNOSIS — M999 Biomechanical lesion, unspecified: Secondary | ICD-10-CM | POA: Diagnosis not present

## 2014-01-18 DIAGNOSIS — M542 Cervicalgia: Secondary | ICD-10-CM | POA: Diagnosis not present

## 2014-01-18 DIAGNOSIS — M62838 Other muscle spasm: Secondary | ICD-10-CM | POA: Diagnosis not present

## 2014-02-15 DIAGNOSIS — M5032 Other cervical disc degeneration, mid-cervical region: Secondary | ICD-10-CM | POA: Diagnosis not present

## 2014-02-15 DIAGNOSIS — S335XXA Sprain of ligaments of lumbar spine, initial encounter: Secondary | ICD-10-CM | POA: Diagnosis not present

## 2014-02-15 DIAGNOSIS — M9901 Segmental and somatic dysfunction of cervical region: Secondary | ICD-10-CM | POA: Diagnosis not present

## 2014-02-15 DIAGNOSIS — M9903 Segmental and somatic dysfunction of lumbar region: Secondary | ICD-10-CM | POA: Diagnosis not present

## 2014-03-01 DIAGNOSIS — S335XXA Sprain of ligaments of lumbar spine, initial encounter: Secondary | ICD-10-CM | POA: Diagnosis not present

## 2014-03-01 DIAGNOSIS — M9903 Segmental and somatic dysfunction of lumbar region: Secondary | ICD-10-CM | POA: Diagnosis not present

## 2014-03-01 DIAGNOSIS — M5032 Other cervical disc degeneration, mid-cervical region: Secondary | ICD-10-CM | POA: Diagnosis not present

## 2014-03-01 DIAGNOSIS — M9901 Segmental and somatic dysfunction of cervical region: Secondary | ICD-10-CM | POA: Diagnosis not present

## 2014-03-11 DIAGNOSIS — M2550 Pain in unspecified joint: Secondary | ICD-10-CM | POA: Diagnosis not present

## 2014-03-11 DIAGNOSIS — N183 Chronic kidney disease, stage 3 (moderate): Secondary | ICD-10-CM | POA: Diagnosis not present

## 2014-03-11 DIAGNOSIS — R7302 Impaired glucose tolerance (oral): Secondary | ICD-10-CM | POA: Diagnosis not present

## 2014-03-11 DIAGNOSIS — I119 Hypertensive heart disease without heart failure: Secondary | ICD-10-CM | POA: Diagnosis not present

## 2014-03-22 DIAGNOSIS — S335XXA Sprain of ligaments of lumbar spine, initial encounter: Secondary | ICD-10-CM | POA: Diagnosis not present

## 2014-03-22 DIAGNOSIS — M9903 Segmental and somatic dysfunction of lumbar region: Secondary | ICD-10-CM | POA: Diagnosis not present

## 2014-03-22 DIAGNOSIS — M9901 Segmental and somatic dysfunction of cervical region: Secondary | ICD-10-CM | POA: Diagnosis not present

## 2014-03-22 DIAGNOSIS — M5032 Other cervical disc degeneration, mid-cervical region: Secondary | ICD-10-CM | POA: Diagnosis not present

## 2014-04-24 ENCOUNTER — Ambulatory Visit (INDEPENDENT_AMBULATORY_CARE_PROVIDER_SITE_OTHER): Payer: Medicare Other | Admitting: Interventional Cardiology

## 2014-04-24 ENCOUNTER — Encounter: Payer: Self-pay | Admitting: Interventional Cardiology

## 2014-04-24 VITALS — BP 115/82 | HR 58 | Ht 70.0 in | Wt 206.0 lb

## 2014-04-24 DIAGNOSIS — I10 Essential (primary) hypertension: Secondary | ICD-10-CM | POA: Diagnosis not present

## 2014-04-24 DIAGNOSIS — I25810 Atherosclerosis of coronary artery bypass graft(s) without angina pectoris: Secondary | ICD-10-CM

## 2014-04-24 DIAGNOSIS — I5032 Chronic diastolic (congestive) heart failure: Secondary | ICD-10-CM

## 2014-04-24 DIAGNOSIS — Z954 Presence of other heart-valve replacement: Secondary | ICD-10-CM

## 2014-04-24 DIAGNOSIS — Z952 Presence of prosthetic heart valve: Secondary | ICD-10-CM

## 2014-04-24 MED ORDER — VALSARTAN-HYDROCHLOROTHIAZIDE 160-12.5 MG PO TABS: 1.0000 | ORAL_TABLET | Freq: Every day | ORAL | Status: DC
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
## 2014-04-24 NOTE — Patient Instructions (Addendum)
Your physician has recommended you make the following change in your medication:  1) STOP Hyzaar  Continue taking all other medications as prescribed  Your physician has requested that you have an echocardiogram. Echocardiography is a painless test that uses sound waves to create images of your heart. It provides your doctor with information about the size and shape of your heart and how well your heart's chambers and valves are working. This procedure takes approximately one hour. There are no restrictions for this procedure.   Your physician wants you to follow-up in: 1 year with Dr.[name] You will receive a reminder letter in the mail two months in advance. If you don't receive a letter, please call our office to schedule the follow-up appointment.

## 2014-04-29 ENCOUNTER — Other Ambulatory Visit (HOSPITAL_COMMUNITY): Payer: Medicare Other

## 2014-04-30 ENCOUNTER — Ambulatory Visit (HOSPITAL_COMMUNITY): Payer: Medicare Other | Attending: Interventional Cardiology | Admitting: Cardiology

## 2014-04-30 ENCOUNTER — Encounter (HOSPITAL_COMMUNITY): Payer: Self-pay | Admitting: Interventional Cardiology

## 2014-04-30 DIAGNOSIS — Z954 Presence of other heart-valve replacement: Secondary | ICD-10-CM | POA: Diagnosis not present

## 2014-04-30 DIAGNOSIS — Z09 Encounter for follow-up examination after completed treatment for conditions other than malignant neoplasm: Secondary | ICD-10-CM | POA: Insufficient documentation

## 2014-04-30 DIAGNOSIS — I371 Nonrheumatic pulmonary valve insufficiency: Secondary | ICD-10-CM | POA: Diagnosis not present

## 2014-04-30 DIAGNOSIS — I509 Heart failure, unspecified: Secondary | ICD-10-CM | POA: Diagnosis not present

## 2014-04-30 DIAGNOSIS — E785 Hyperlipidemia, unspecified: Secondary | ICD-10-CM | POA: Diagnosis not present

## 2014-04-30 DIAGNOSIS — I10 Essential (primary) hypertension: Secondary | ICD-10-CM | POA: Insufficient documentation

## 2014-04-30 DIAGNOSIS — Z952 Presence of prosthetic heart valve: Secondary | ICD-10-CM

## 2014-04-30 DIAGNOSIS — I2510 Atherosclerotic heart disease of native coronary artery without angina pectoris: Secondary | ICD-10-CM | POA: Diagnosis not present

## 2014-04-30 DIAGNOSIS — I080 Rheumatic disorders of both mitral and aortic valves: Secondary | ICD-10-CM | POA: Diagnosis not present

## 2014-04-30 NOTE — Progress Notes (Signed)
Echo performed. 

## 2014-05-14 ENCOUNTER — Telehealth: Payer: Self-pay

## 2014-05-14 NOTE — Telephone Encounter (Signed)
-----   Message from [place], MD sent at 05/10/2014  6:22 PM EST ----- Valve function and structure is normal. There is mild LVH.Function is normal

## 2014-05-14 NOTE — Telephone Encounter (Signed)
Called to give pt echo result.lmtcb

## 2014-05-15 NOTE — Telephone Encounter (Signed)
Pt aware of echo results.  Valve function and structure is normal. There is mild LVH.Function is normal  Pt verbalized understanding.

## 2014-05-15 NOTE — Telephone Encounter (Signed)
Follow Up ° ° ° ° ° ° ° °Pt returning phone call °

## 2014-05-15 NOTE — Telephone Encounter (Signed)
-----   Message from [place], MD sent at 05/10/2014  6:22 PM EST ----- Valve function and structure is normal. There is mild LVH.Function is normal

## 2014-05-30 DIAGNOSIS — R3915 Urgency of urination: Secondary | ICD-10-CM | POA: Diagnosis not present

## 2014-05-30 DIAGNOSIS — N401 Enlarged prostate with lower urinary tract symptoms: Secondary | ICD-10-CM | POA: Diagnosis not present

## 2014-05-30 DIAGNOSIS — R351 Nocturia: Secondary | ICD-10-CM | POA: Diagnosis not present

## 2014-07-01 DIAGNOSIS — I359 Nonrheumatic aortic valve disorder, unspecified: Secondary | ICD-10-CM | POA: Diagnosis not present

## 2014-07-01 DIAGNOSIS — M1000 Idiopathic gout, unspecified site: Secondary | ICD-10-CM | POA: Diagnosis not present

## 2014-07-01 DIAGNOSIS — M2550 Pain in unspecified joint: Secondary | ICD-10-CM | POA: Diagnosis not present

## 2014-07-01 DIAGNOSIS — Z125 Encounter for screening for malignant neoplasm of prostate: Secondary | ICD-10-CM | POA: Diagnosis not present

## 2014-07-01 DIAGNOSIS — Z79899 Other long term (current) drug therapy: Secondary | ICD-10-CM | POA: Diagnosis not present

## 2014-07-01 DIAGNOSIS — I119 Hypertensive heart disease without heart failure: Secondary | ICD-10-CM | POA: Diagnosis not present

## 2014-07-01 DIAGNOSIS — E780 Pure hypercholesterolemia: Secondary | ICD-10-CM | POA: Diagnosis not present

## 2014-07-01 DIAGNOSIS — L299 Pruritus, unspecified: Secondary | ICD-10-CM | POA: Diagnosis not present

## 2014-08-15 DIAGNOSIS — N451 Epididymitis: Secondary | ICD-10-CM | POA: Diagnosis not present

## 2014-08-19 DIAGNOSIS — H2513 Age-related nuclear cataract, bilateral: Secondary | ICD-10-CM | POA: Diagnosis not present

## 2014-08-19 DIAGNOSIS — H43813 Vitreous degeneration, bilateral: Secondary | ICD-10-CM | POA: Diagnosis not present

## 2014-08-19 DIAGNOSIS — H04123 Dry eye syndrome of bilateral lacrimal glands: Secondary | ICD-10-CM | POA: Diagnosis not present

## 2014-09-12 DIAGNOSIS — N401 Enlarged prostate with lower urinary tract symptoms: Secondary | ICD-10-CM | POA: Diagnosis not present

## 2014-09-12 DIAGNOSIS — N451 Epididymitis: Secondary | ICD-10-CM | POA: Diagnosis not present

## 2014-09-12 DIAGNOSIS — N138 Other obstructive and reflux uropathy: Secondary | ICD-10-CM | POA: Diagnosis not present

## 2014-09-12 DIAGNOSIS — R351 Nocturia: Secondary | ICD-10-CM | POA: Diagnosis not present

## 2014-11-05 DIAGNOSIS — I119 Hypertensive heart disease without heart failure: Secondary | ICD-10-CM | POA: Diagnosis not present

## 2014-11-05 DIAGNOSIS — I2510 Atherosclerotic heart disease of native coronary artery without angina pectoris: Secondary | ICD-10-CM | POA: Diagnosis not present

## 2014-11-05 DIAGNOSIS — M25551 Pain in right hip: Secondary | ICD-10-CM | POA: Diagnosis not present

## 2014-11-05 DIAGNOSIS — R7302 Impaired glucose tolerance (oral): Secondary | ICD-10-CM | POA: Diagnosis not present

## 2014-11-05 DIAGNOSIS — Z79899 Other long term (current) drug therapy: Secondary | ICD-10-CM | POA: Diagnosis not present

## 2014-11-05 DIAGNOSIS — I350 Nonrheumatic aortic (valve) stenosis: Secondary | ICD-10-CM | POA: Diagnosis not present

## 2014-11-05 DIAGNOSIS — M159 Polyosteoarthritis, unspecified: Secondary | ICD-10-CM | POA: Diagnosis not present

## 2014-11-05 DIAGNOSIS — N183 Chronic kidney disease, stage 3 (moderate): Secondary | ICD-10-CM | POA: Diagnosis not present

## 2015-03-27 DIAGNOSIS — I2510 Atherosclerotic heart disease of native coronary artery without angina pectoris: Secondary | ICD-10-CM | POA: Diagnosis not present

## 2015-03-27 DIAGNOSIS — R7302 Impaired glucose tolerance (oral): Secondary | ICD-10-CM | POA: Diagnosis not present

## 2015-03-27 DIAGNOSIS — M545 Low back pain: Secondary | ICD-10-CM | POA: Diagnosis not present

## 2015-03-27 DIAGNOSIS — N183 Chronic kidney disease, stage 3 (moderate): Secondary | ICD-10-CM | POA: Diagnosis not present

## 2015-03-27 DIAGNOSIS — I119 Hypertensive heart disease without heart failure: Secondary | ICD-10-CM | POA: Diagnosis not present

## 2015-03-27 DIAGNOSIS — I069 Rheumatic aortic valve disease, unspecified: Secondary | ICD-10-CM | POA: Diagnosis not present

## 2015-03-27 DIAGNOSIS — M159 Polyosteoarthritis, unspecified: Secondary | ICD-10-CM | POA: Diagnosis not present

## 2015-03-27 DIAGNOSIS — Z79899 Other long term (current) drug therapy: Secondary | ICD-10-CM | POA: Diagnosis not present

## 2015-03-27 DIAGNOSIS — E7800 Pure hypercholesterolemia, unspecified: Secondary | ICD-10-CM | POA: Diagnosis not present

## 2015-03-27 DIAGNOSIS — M25551 Pain in right hip: Secondary | ICD-10-CM | POA: Diagnosis not present

## 2015-04-07 ENCOUNTER — Telehealth: Payer: Self-pay | Admitting: Interventional Cardiology

## 2015-04-07 NOTE — Telephone Encounter (Signed)
Returned pt call. Pt sts that he received a letter from our office stating he should d/c Hyzaar. Adv pt that I could not locate any documentation stating that, adv pt that our office have not mailed any correspondences to him since Dec 2015. Pt is scheduled to see Dr.[name] in Feb 2017, pt will bring the letter with him. Pt has not interrupted Hyzaar and will continue taking. Pt voiced appreciation for the call back.

## 2015-04-07 NOTE — Telephone Encounter (Signed)
New problem   Pt has a question concerning a medication that he was told to stop taking. Please call pt.

## 2015-04-22 DIAGNOSIS — M5441 Lumbago with sciatica, right side: Secondary | ICD-10-CM | POA: Diagnosis not present

## 2015-04-22 DIAGNOSIS — M7061 Trochanteric bursitis, right hip: Secondary | ICD-10-CM | POA: Diagnosis not present

## 2015-05-07 DIAGNOSIS — M5441 Lumbago with sciatica, right side: Secondary | ICD-10-CM | POA: Diagnosis not present

## 2015-05-19 DIAGNOSIS — H16223 Keratoconjunctivitis sicca, not specified as Sjogren's, bilateral: Secondary | ICD-10-CM | POA: Diagnosis not present

## 2015-05-19 DIAGNOSIS — H2513 Age-related nuclear cataract, bilateral: Secondary | ICD-10-CM | POA: Diagnosis not present

## 2015-05-19 DIAGNOSIS — H43813 Vitreous degeneration, bilateral: Secondary | ICD-10-CM | POA: Diagnosis not present

## 2015-05-27 DIAGNOSIS — M4806 Spinal stenosis, lumbar region: Secondary | ICD-10-CM | POA: Diagnosis not present

## 2015-05-27 DIAGNOSIS — M47817 Spondylosis without myelopathy or radiculopathy, lumbosacral region: Secondary | ICD-10-CM | POA: Diagnosis not present

## 2015-05-27 DIAGNOSIS — M5441 Lumbago with sciatica, right side: Secondary | ICD-10-CM | POA: Diagnosis not present

## 2015-05-29 DIAGNOSIS — N401 Enlarged prostate with lower urinary tract symptoms: Secondary | ICD-10-CM | POA: Diagnosis not present

## 2015-05-29 DIAGNOSIS — Z0000 Encounter for general adult medical examination without abnormal findings: Secondary | ICD-10-CM | POA: Diagnosis not present

## 2015-05-29 DIAGNOSIS — N138 Other obstructive and reflux uropathy: Secondary | ICD-10-CM | POA: Diagnosis not present

## 2015-05-30 DIAGNOSIS — G8929 Other chronic pain: Secondary | ICD-10-CM | POA: Diagnosis not present

## 2015-05-30 DIAGNOSIS — M4806 Spinal stenosis, lumbar region: Secondary | ICD-10-CM | POA: Diagnosis not present

## 2015-05-30 DIAGNOSIS — M5441 Lumbago with sciatica, right side: Secondary | ICD-10-CM | POA: Diagnosis not present

## 2015-06-04 DIAGNOSIS — M545 Low back pain: Secondary | ICD-10-CM | POA: Diagnosis not present

## 2015-06-06 DIAGNOSIS — M545 Low back pain: Secondary | ICD-10-CM | POA: Diagnosis not present

## 2015-06-06 DIAGNOSIS — M4726 Other spondylosis with radiculopathy, lumbar region: Secondary | ICD-10-CM | POA: Diagnosis not present

## 2015-06-06 DIAGNOSIS — M5416 Radiculopathy, lumbar region: Secondary | ICD-10-CM | POA: Diagnosis not present

## 2015-06-10 DIAGNOSIS — M4806 Spinal stenosis, lumbar region: Secondary | ICD-10-CM | POA: Diagnosis not present

## 2015-06-10 DIAGNOSIS — M5441 Lumbago with sciatica, right side: Secondary | ICD-10-CM | POA: Diagnosis not present

## 2015-06-19 DIAGNOSIS — M5441 Lumbago with sciatica, right side: Secondary | ICD-10-CM | POA: Diagnosis not present

## 2015-06-19 DIAGNOSIS — M5416 Radiculopathy, lumbar region: Secondary | ICD-10-CM | POA: Diagnosis not present

## 2015-06-19 DIAGNOSIS — G8929 Other chronic pain: Secondary | ICD-10-CM | POA: Diagnosis not present

## 2015-06-19 DIAGNOSIS — M4806 Spinal stenosis, lumbar region: Secondary | ICD-10-CM | POA: Diagnosis not present

## 2015-06-23 ENCOUNTER — Encounter (HOSPITAL_COMMUNITY): Payer: Self-pay | Admitting: *Deleted

## 2015-06-23 ENCOUNTER — Emergency Department (HOSPITAL_COMMUNITY)
Admission: EM | Admit: 2015-06-23 | Discharge: 2015-06-23 | Disposition: A | Discharge status: Home / Self Care | Payer: Medicare Other | Attending: Emergency Medicine | Admitting: Emergency Medicine

## 2015-06-23 ENCOUNTER — Emergency Department (HOSPITAL_COMMUNITY): Payer: Medicare Other

## 2015-06-23 DIAGNOSIS — E785 Hyperlipidemia, unspecified: Secondary | ICD-10-CM | POA: Insufficient documentation

## 2015-06-23 DIAGNOSIS — Z7982 Long term (current) use of aspirin: Secondary | ICD-10-CM | POA: Insufficient documentation

## 2015-06-23 DIAGNOSIS — Q231 Congenital insufficiency of aortic valve: Secondary | ICD-10-CM | POA: Diagnosis not present

## 2015-06-23 DIAGNOSIS — Z8701 Personal history of pneumonia (recurrent): Secondary | ICD-10-CM | POA: Diagnosis not present

## 2015-06-23 DIAGNOSIS — Z8719 Personal history of other diseases of the digestive system: Secondary | ICD-10-CM | POA: Insufficient documentation

## 2015-06-23 DIAGNOSIS — Z79899 Other long term (current) drug therapy: Secondary | ICD-10-CM | POA: Diagnosis not present

## 2015-06-23 DIAGNOSIS — Z8659 Personal history of other mental and behavioral disorders: Secondary | ICD-10-CM | POA: Diagnosis not present

## 2015-06-23 DIAGNOSIS — N400 Enlarged prostate without lower urinary tract symptoms: Secondary | ICD-10-CM | POA: Diagnosis not present

## 2015-06-23 DIAGNOSIS — I2510 Atherosclerotic heart disease of native coronary artery without angina pectoris: Secondary | ICD-10-CM | POA: Diagnosis not present

## 2015-06-23 DIAGNOSIS — Z872 Personal history of diseases of the skin and subcutaneous tissue: Secondary | ICD-10-CM | POA: Insufficient documentation

## 2015-06-23 DIAGNOSIS — R1110 Vomiting, unspecified: Secondary | ICD-10-CM | POA: Insufficient documentation

## 2015-06-23 DIAGNOSIS — I10 Essential (primary) hypertension: Secondary | ICD-10-CM | POA: Insufficient documentation

## 2015-06-23 DIAGNOSIS — R42 Dizziness and giddiness: Secondary | ICD-10-CM | POA: Diagnosis not present

## 2015-06-23 LAB — CBC
HEMATOCRIT: 45.3 % (ref 39.0–52.0)
HEMOGLOBIN: 14.7 g/dL (ref 13.0–17.0)
MCH: 28.3 pg (ref 26.0–34.0)
MCHC: 32.5 g/dL (ref 30.0–36.0)
MCV: 87.1 fL (ref 78.0–100.0)
Platelets: 183 10*3/uL (ref 150–400)
RBC: 5.2 MIL/uL (ref 4.22–5.81)
RDW: 13.9 % (ref 11.5–15.5)
WBC: 5.4 10*3/uL (ref 4.0–10.5)

## 2015-06-23 LAB — URINALYSIS, ROUTINE W REFLEX MICROSCOPIC
BILIRUBIN URINE: NEGATIVE
GLUCOSE, UA: NEGATIVE mg/dL
HGB URINE DIPSTICK: NEGATIVE
KETONES UR: NEGATIVE mg/dL
Leukocytes, UA: NEGATIVE
Nitrite: NEGATIVE
PH: 7.5 (ref 5.0–8.0)
Protein, ur: NEGATIVE mg/dL
SPECIFIC GRAVITY, URINE: 1.015 (ref 1.005–1.030)

## 2015-06-23 LAB — I-STAT CHEM 8, ED
BUN: 22 mg/dL — AB (ref 6–20)
CREATININE: 1.4 mg/dL — AB (ref 0.61–1.24)
Calcium, Ion: 1.13 mmol/L (ref 1.13–1.30)
Chloride: 100 mmol/L — ABNORMAL LOW (ref 101–111)
GLUCOSE: 116 mg/dL — AB (ref 65–99)
HEMATOCRIT: 47 % (ref 39.0–52.0)
HEMOGLOBIN: 16 g/dL (ref 13.0–17.0)
Potassium: 3.7 mmol/L (ref 3.5–5.1)
Sodium: 140 mmol/L (ref 135–145)
TCO2: 28 mmol/L (ref 0–100)

## 2015-06-23 LAB — COMPREHENSIVE METABOLIC PANEL
ALBUMIN: 4.9 g/dL (ref 3.5–5.0)
ALT: 22 U/L (ref 17–63)
ANION GAP: 10 (ref 5–15)
AST: 28 U/L (ref 15–41)
Alkaline Phosphatase: 34 U/L — ABNORMAL LOW (ref 38–126)
BILIRUBIN TOTAL: 0.8 mg/dL (ref 0.3–1.2)
BUN: 21 mg/dL — AB (ref 6–20)
CO2: 28 mmol/L (ref 22–32)
Calcium: 10.1 mg/dL (ref 8.9–10.3)
Chloride: 102 mmol/L (ref 101–111)
Creatinine, Ser: 1.33 mg/dL — ABNORMAL HIGH (ref 0.61–1.24)
GFR calc Af Amer: 59 mL/min — ABNORMAL LOW (ref >60)
GFR calc non Af Amer: 51 mL/min — ABNORMAL LOW (ref >60)
GLUCOSE: 124 mg/dL — AB (ref 65–99)
POTASSIUM: 3.6 mmol/L (ref 3.5–5.1)
SODIUM: 140 mmol/L (ref 135–145)
TOTAL PROTEIN: 8.2 g/dL — AB (ref 6.5–8.1)

## 2015-06-23 LAB — RAPID URINE DRUG SCREEN, HOSP PERFORMED
Amphetamines: NOT DETECTED
BENZODIAZEPINES: NOT DETECTED
Barbiturates: NOT DETECTED
COCAINE: NOT DETECTED
OPIATES: NOT DETECTED
TETRAHYDROCANNABINOL: NOT DETECTED

## 2015-06-23 LAB — DIFFERENTIAL
BASOS ABS: 0 10*3/uL (ref 0.0–0.1)
Basophils Relative: 0 %
EOS ABS: 0.1 10*3/uL (ref 0.0–0.7)
EOS PCT: 1 %
LYMPHS ABS: 1.3 10*3/uL (ref 0.7–4.0)
LYMPHS PCT: 22 %
Monocytes Absolute: 0.5 10*3/uL (ref 0.1–1.0)
Monocytes Relative: 8 %
NEUTROS PCT: 69 %
Neutro Abs: 3.9 10*3/uL (ref 1.7–7.7)

## 2015-06-23 LAB — I-STAT TROPONIN, ED: Troponin i, poc: 0.01 ng/mL (ref 0.00–0.08)

## 2015-06-23 LAB — PROTIME-INR
INR: 1.18 (ref 0.00–1.49)
Prothrombin Time: 15.2 seconds (ref 11.6–15.2)

## 2015-06-23 LAB — ETHANOL: Alcohol, Ethyl (B): <5 mg/dL (ref <5)

## 2015-06-23 LAB — LIPASE, BLOOD: Lipase: 42 U/L (ref 11–51)

## 2015-06-23 MED ORDER — LORAZEPAM 1 MG PO TABS
1.0000 mg | ORAL_TABLET | Freq: Once | ORAL | Status: AC
  Administered 2015-06-23: 1 mg via ORAL
  Filled 2015-06-23: qty 1

## 2015-06-23 MED ORDER — LORAZEPAM 1 MG PO TABS
1.0000 mg | ORAL_TABLET | Freq: Three times a day (TID) | ORAL | Status: DC | PRN
Start: 2015-06-23 — End: 2019-01-08
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
## 2015-06-23 NOTE — ED Notes (Signed)
Pt ambulated 40 ft; however, c/o slight dizziness and "feeling like something is not quite right"; wife stated that gait was a lot better than this morning, "much improved"; but not his usual gait.  Pt ambulated with and without his cane.  Pt felt better without cane.

## 2015-06-23 NOTE — ED Notes (Signed)
Pt taken to MRI  

## 2015-06-23 NOTE — Discharge Instructions (Signed)
Take your medication as prescribed as needed for dizziness.Continue taking your home medications as prescribed. Follow-up with your primary care provider in the next 3-4 days. I also recommend calling the ENT office listed above to schedule a follow-up appointment if your dizziness continues. Return to the emergency department if symptoms worsen or new onset of fever, headache, visual changes, chest pain, shortness of breath, numbness, tingling, weakness, syncope, seizure.

## 2015-06-23 NOTE — ED Provider Notes (Signed)
Pt is a 74 yo male with PMH of HLD, aortic stenosis, HTN, CAD, enlarged prostate, anxiety who presents to the ED with complaint of vertigo. Pt reports yesterday when he was lying in bed he began to have reported dizziness, "room spinning" resulting in him having difficulty wakling. He notes his woke up with sxs this morning. Endorses associated N/V, feeling weak all over. Stroke risk factors include CAD, aortica valve replacement. Denies CP, SOB, fever, difficulty speaking/swallowing, HA. Pt reports taking ASA daily.  Physical Exam  BP 111/73 mmHg  Pulse 67  Temp(Src) 97.6 F (36.4 C) (Oral)  Resp 14  SpO2 100%  Physical Exam  Constitutional: He is oriented to person, place, and time. He appears well-developed and well-nourished. No distress.  HENT:  Head: Normocephalic and atraumatic.  Mouth/Throat: Oropharynx is clear and moist. No oropharyngeal exudate.  Eyes: Conjunctivae and EOM are normal. Pupils are equal, round, and reactive to light. Right eye exhibits no discharge. Left eye exhibits no discharge. No scleral icterus.  Neck: Normal range of motion. Neck supple.  Cardiovascular: Normal rate, regular rhythm, normal heart sounds and intact distal pulses.   Pulmonary/Chest: Effort normal and breath sounds normal. No respiratory distress. He has no wheezes. He has no rales. He exhibits no tenderness.  Abdominal: Soft. Bowel sounds are normal. He exhibits no distension and no mass. There is no tenderness. There is no rebound and no guarding.  Musculoskeletal: Normal range of motion. He exhibits no edema or tenderness.  Lymphadenopathy:    He has no cervical adenopathy.  Neurological: He is alert and oriented to person, place, and time. He has normal strength. No cranial nerve deficit or sensory deficit. He displays a negative Romberg sign. Coordination normal.  Skin: Skin is warm and dry. He is not diaphoretic. No pallor.  Nursing note and vitals reviewed.   ED Course   Procedures  MDM  Hand-off from Margarita Mail, PA-C. Pending MRI brain.  Patient presents with vertigo symptoms. Initial labs and head CT negative. Due to patient with history of arterial disease MRI brain was ordered for further evaluation.  On my initial evaluation, pt reports his vertigo sxs have significantly improved since arrival to the ED. Exam unremarkable, no neuro deficits. MR brain showed no acute intracranial findings. Patient given Ativan in the ED for vertigo sxs. Will try to ambulate pt in the hall with plan to d/c pt home. Patient able to ambulate without any reported episodes of lightheadedness/dizziness. No ataxia noted. I suspect patient's symptoms are likely due to peripheral vertigo etiology. Plan to discharge patient home with ENT follow-up.  Evaluation does not show pathology requring ongoing emergent intervention or admission. Pt is hemodynamically stable and mentating appropriately. Discussed findings/results and plan with patient/guardian, who agrees with plan. All questions answered. Return precautions discussed and outpatient follow up given.      Chesley Noon [place], [place] 06/23/15 2347  Davonna Belling, MD 06/24/15 0000

## 2015-06-23 NOTE — ED Notes (Signed)
Made first request for urine,pt unable to provide one at this time

## 2015-06-23 NOTE — ED Notes (Signed)
Per EMS, pt from home, pt reports dizziness and nausea with vomiting x 2 days.  Pt reports weakness as well, not able to eat or drink or ambulate to the BR d/t weakness.

## 2015-06-23 NOTE — ED Notes (Signed)
PA at bedside.

## 2015-06-23 NOTE — Progress Notes (Signed)
Pt confirms pcp as [facility]

## 2015-07-03 ENCOUNTER — Ambulatory Visit (INDEPENDENT_AMBULATORY_CARE_PROVIDER_SITE_OTHER): Payer: Medicare Other | Admitting: Interventional Cardiology

## 2015-07-03 ENCOUNTER — Encounter: Payer: Self-pay | Admitting: Interventional Cardiology

## 2015-07-03 VITALS — BP 100/72 | HR 56 | Ht 70.0 in | Wt 197.8 lb

## 2015-07-03 DIAGNOSIS — I10 Essential (primary) hypertension: Secondary | ICD-10-CM | POA: Diagnosis not present

## 2015-07-03 DIAGNOSIS — I25810 Atherosclerosis of coronary artery bypass graft(s) without angina pectoris: Secondary | ICD-10-CM | POA: Diagnosis not present

## 2015-07-03 DIAGNOSIS — Z952 Presence of prosthetic heart valve: Secondary | ICD-10-CM

## 2015-07-03 DIAGNOSIS — R42 Dizziness and giddiness: Secondary | ICD-10-CM

## 2015-07-03 DIAGNOSIS — I5032 Chronic diastolic (congestive) heart failure: Secondary | ICD-10-CM

## 2015-07-03 DIAGNOSIS — Z954 Presence of other heart-valve replacement: Secondary | ICD-10-CM | POA: Diagnosis not present

## 2015-07-03 DIAGNOSIS — R0989 Other specified symptoms and signs involving the circulatory and respiratory systems: Secondary | ICD-10-CM

## 2015-07-03 MED ORDER — MECLIZINE HCL 12.5 MG PO TABS: 12.5000 mg | ORAL_TABLET | Freq: Two times a day (BID) | ORAL | Status: DC

## 2015-07-03 NOTE — Patient Instructions (Addendum)
Medication Instructions:  Your physician has recommended you make the following change in your medication:  START Antivert 12.5mg  twice daily as needed for vertigo. An Rx has been sent to your pharmacy   Labwork: None ordered   Testing/Procedures: Your physician has requested that you have a carotid duplex. This test is an ultrasound of the carotid arteries in your neck. It looks at blood flow through these arteries that supply the brain with blood. Allow one hour for this exam. There are no restrictions or special instructions.   Follow-Up: Your physician wants you to follow-up in: 1 year with Dr.[name] You will receive a reminder letter in the mail two months in advance. If you don't receive a letter, please call our office to schedule the follow-up appointment.    Any Other Special Instructions Will Be Listed Below (If Applicable).     If you need a refill on your cardiac medications before your next appointment, please call your pharmacy.
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
## 2015-07-08 ENCOUNTER — Ambulatory Visit (HOSPITAL_COMMUNITY)
Admission: RE | Admit: 2015-07-08 | Discharge: 2015-07-08 | Disposition: A | Discharge status: Home / Self Care | Payer: Medicare Other | Source: Ambulatory Visit | Attending: Interventional Cardiology | Admitting: Interventional Cardiology

## 2015-07-08 DIAGNOSIS — E785 Hyperlipidemia, unspecified: Secondary | ICD-10-CM | POA: Diagnosis not present

## 2015-07-08 DIAGNOSIS — I10 Essential (primary) hypertension: Secondary | ICD-10-CM | POA: Insufficient documentation

## 2015-07-08 DIAGNOSIS — I6523 Occlusion and stenosis of bilateral carotid arteries: Secondary | ICD-10-CM | POA: Insufficient documentation

## 2015-07-08 DIAGNOSIS — R0989 Other specified symptoms and signs involving the circulatory and respiratory systems: Secondary | ICD-10-CM | POA: Insufficient documentation

## 2015-07-08 DIAGNOSIS — R42 Dizziness and giddiness: Secondary | ICD-10-CM

## 2015-07-17 DIAGNOSIS — Z1211 Encounter for screening for malignant neoplasm of colon: Secondary | ICD-10-CM | POA: Diagnosis not present

## 2015-07-17 DIAGNOSIS — I119 Hypertensive heart disease without heart failure: Secondary | ICD-10-CM | POA: Diagnosis not present

## 2015-07-17 DIAGNOSIS — I2510 Atherosclerotic heart disease of native coronary artery without angina pectoris: Secondary | ICD-10-CM | POA: Diagnosis not present

## 2015-07-17 DIAGNOSIS — M25551 Pain in right hip: Secondary | ICD-10-CM | POA: Diagnosis not present

## 2015-07-17 DIAGNOSIS — M159 Polyosteoarthritis, unspecified: Secondary | ICD-10-CM | POA: Diagnosis not present

## 2015-07-17 DIAGNOSIS — N183 Chronic kidney disease, stage 3 (moderate): Secondary | ICD-10-CM | POA: Diagnosis not present

## 2015-07-17 DIAGNOSIS — Z79899 Other long term (current) drug therapy: Secondary | ICD-10-CM | POA: Diagnosis not present

## 2015-07-17 DIAGNOSIS — R7302 Impaired glucose tolerance (oral): Secondary | ICD-10-CM | POA: Diagnosis not present

## 2015-07-17 DIAGNOSIS — Z0001 Encounter for general adult medical examination with abnormal findings: Secondary | ICD-10-CM | POA: Diagnosis not present

## 2015-07-17 DIAGNOSIS — E7800 Pure hypercholesterolemia, unspecified: Secondary | ICD-10-CM | POA: Diagnosis not present

## 2015-07-17 DIAGNOSIS — M545 Low back pain: Secondary | ICD-10-CM | POA: Diagnosis not present

## 2015-07-21 DIAGNOSIS — R531 Weakness: Secondary | ICD-10-CM | POA: Diagnosis not present

## 2015-07-22 DIAGNOSIS — M5441 Lumbago with sciatica, right side: Secondary | ICD-10-CM | POA: Diagnosis not present

## 2015-07-22 DIAGNOSIS — M4806 Spinal stenosis, lumbar region: Secondary | ICD-10-CM | POA: Diagnosis not present

## 2015-07-22 DIAGNOSIS — M47817 Spondylosis without myelopathy or radiculopathy, lumbosacral region: Secondary | ICD-10-CM | POA: Diagnosis not present

## 2015-08-05 DIAGNOSIS — H43813 Vitreous degeneration, bilateral: Secondary | ICD-10-CM | POA: Diagnosis not present

## 2015-08-05 DIAGNOSIS — H2513 Age-related nuclear cataract, bilateral: Secondary | ICD-10-CM | POA: Diagnosis not present

## 2015-08-05 DIAGNOSIS — H04123 Dry eye syndrome of bilateral lacrimal glands: Secondary | ICD-10-CM | POA: Diagnosis not present

## 2015-09-23 DIAGNOSIS — M4806 Spinal stenosis, lumbar region: Secondary | ICD-10-CM | POA: Diagnosis not present

## 2015-09-23 DIAGNOSIS — M5441 Lumbago with sciatica, right side: Secondary | ICD-10-CM | POA: Diagnosis not present

## 2015-09-23 DIAGNOSIS — M5416 Radiculopathy, lumbar region: Secondary | ICD-10-CM | POA: Diagnosis not present

## 2015-09-23 DIAGNOSIS — M47817 Spondylosis without myelopathy or radiculopathy, lumbosacral region: Secondary | ICD-10-CM | POA: Diagnosis not present

## 2015-11-24 ENCOUNTER — Other Ambulatory Visit (HOSPITAL_COMMUNITY): Payer: Self-pay | Admitting: Pulmonary Disease

## 2015-11-24 DIAGNOSIS — Z79899 Other long term (current) drug therapy: Secondary | ICD-10-CM | POA: Diagnosis not present

## 2015-11-24 DIAGNOSIS — Z952 Presence of prosthetic heart valve: Secondary | ICD-10-CM | POA: Diagnosis not present

## 2015-11-24 DIAGNOSIS — M545 Low back pain: Secondary | ICD-10-CM | POA: Diagnosis not present

## 2015-11-24 DIAGNOSIS — E7800 Pure hypercholesterolemia, unspecified: Secondary | ICD-10-CM | POA: Diagnosis not present

## 2015-11-24 DIAGNOSIS — I119 Hypertensive heart disease without heart failure: Secondary | ICD-10-CM | POA: Diagnosis not present

## 2015-11-24 DIAGNOSIS — R634 Abnormal weight loss: Secondary | ICD-10-CM

## 2015-11-24 DIAGNOSIS — I351 Nonrheumatic aortic (valve) insufficiency: Secondary | ICD-10-CM | POA: Diagnosis not present

## 2015-11-24 DIAGNOSIS — I350 Nonrheumatic aortic (valve) stenosis: Secondary | ICD-10-CM | POA: Diagnosis not present

## 2015-11-24 DIAGNOSIS — I2510 Atherosclerotic heart disease of native coronary artery without angina pectoris: Secondary | ICD-10-CM | POA: Diagnosis not present

## 2015-11-24 DIAGNOSIS — R7302 Impaired glucose tolerance (oral): Secondary | ICD-10-CM | POA: Diagnosis not present

## 2015-11-24 DIAGNOSIS — M1990 Unspecified osteoarthritis, unspecified site: Secondary | ICD-10-CM | POA: Diagnosis not present

## 2015-11-24 DIAGNOSIS — R079 Chest pain, unspecified: Secondary | ICD-10-CM | POA: Diagnosis not present

## 2015-11-27 ENCOUNTER — Telehealth: Payer: Self-pay | Admitting: Interventional Cardiology

## 2015-11-27 NOTE — Telephone Encounter (Signed)
NEW MESSAGE   PT PCP OFFICE IS CALLING TO REPORT CHEST PAIN  Pt c/o of Chest Pain: STAT if CP now or developed within 24 hours  1. Are you having CP right now? UNKNOWN  2. Are you experiencing any other symptoms (ex. SOB, nausea, vomiting, sweating)? NO 3. How long have you been experiencing CP? 3 WEEKS 4. Is your CP continuous or coming and going?  coming and going  5. Have you taken Nitroglycerin? NO?  7/17 BP    102/67   HR 51

## 2015-11-27 NOTE — Telephone Encounter (Signed)
Spoke w/pt.  He states he has been having chest discomfort off and on for about 1 week.  States discomfort is in (L) side of chest and rates it about a "3" out of 10.  Denies SOB. Has not taken any NTG. [name] had an opening tomorrow 7/21.  Pt states he can be here at that time.  Advised to bring all of his medications.

## 2015-11-27 NOTE — Telephone Encounter (Signed)
Dr. Oralia Rud office calling stating [name] has been having CP off and on for 3 wks.  Tried calling pt and had to LM.

## 2015-11-28 ENCOUNTER — Encounter: Payer: Self-pay | Admitting: Interventional Cardiology

## 2015-11-28 ENCOUNTER — Ambulatory Visit (INDEPENDENT_AMBULATORY_CARE_PROVIDER_SITE_OTHER): Payer: Medicare Other | Admitting: Interventional Cardiology

## 2015-11-28 VITALS — BP 114/76 | HR 54 | Ht 70.0 in | Wt 197.4 lb

## 2015-11-28 DIAGNOSIS — I10 Essential (primary) hypertension: Secondary | ICD-10-CM | POA: Diagnosis not present

## 2015-11-28 DIAGNOSIS — Z954 Presence of other heart-valve replacement: Secondary | ICD-10-CM | POA: Diagnosis not present

## 2015-11-28 DIAGNOSIS — R079 Chest pain, unspecified: Secondary | ICD-10-CM | POA: Diagnosis not present

## 2015-11-28 DIAGNOSIS — I25810 Atherosclerosis of coronary artery bypass graft(s) without angina pectoris: Secondary | ICD-10-CM

## 2015-11-28 DIAGNOSIS — I5032 Chronic diastolic (congestive) heart failure: Secondary | ICD-10-CM

## 2015-11-28 DIAGNOSIS — Z952 Presence of prosthetic heart valve: Secondary | ICD-10-CM

## 2015-11-28 NOTE — Patient Instructions (Addendum)
Medication Instructions:  Your physician recommends that you continue on your current medications as directed. Please refer to the Current Medication list given to you today.   Labwork: None ordered  Testing/Procedures: Your physician has requested that you have a lexiscan myoview. For further information please visit [email or web address]. Please follow instruction sheet, as given. (To be scheduled the first week in August)  Follow-Up: Follow up as planned or sooner pending test results   Any Other Special Instructions Will Be Listed Below (If Applicable).     If you need a refill on your cardiac medications before your next appointment, please call your pharmacy.
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
## 2015-12-02 ENCOUNTER — Ambulatory Visit (HOSPITAL_COMMUNITY)
Admission: RE | Admit: 2015-12-02 | Discharge: 2015-12-02 | Disposition: A | Discharge status: Home / Self Care | Payer: Medicare Other | Source: Ambulatory Visit | Attending: Pulmonary Disease | Admitting: Pulmonary Disease

## 2015-12-02 DIAGNOSIS — I700 Atherosclerosis of aorta: Secondary | ICD-10-CM | POA: Diagnosis not present

## 2015-12-02 DIAGNOSIS — R911 Solitary pulmonary nodule: Secondary | ICD-10-CM | POA: Diagnosis not present

## 2015-12-02 DIAGNOSIS — R634 Abnormal weight loss: Secondary | ICD-10-CM | POA: Diagnosis not present

## 2015-12-02 DIAGNOSIS — N400 Enlarged prostate without lower urinary tract symptoms: Secondary | ICD-10-CM | POA: Insufficient documentation

## 2015-12-09 ENCOUNTER — Telehealth (HOSPITAL_COMMUNITY): Payer: Self-pay | Admitting: *Deleted

## 2015-12-09 NOTE — Telephone Encounter (Signed)
Left message on voicemail per DPR in reference to upcoming appointment scheduled on  12/11/15 with detailed instructions given per Stress Test Requisition Sheet for the test. LM to arrive 30 minutes early, and that it is imperative to arrive on time for appointment to keep from having the test rescheduled. If you need to cancel or reschedule your appointment, please call the office within 24 hours of your appointment. Failure to do so may result in a cancellation of your appointment, and a $50 no show fee. Phone number given for call back for any questions. Hubbard Robinson, RN

## 2015-12-11 ENCOUNTER — Ambulatory Visit (HOSPITAL_COMMUNITY): Payer: Medicare Other | Attending: Cardiology

## 2015-12-11 DIAGNOSIS — I10 Essential (primary) hypertension: Secondary | ICD-10-CM | POA: Diagnosis not present

## 2015-12-11 DIAGNOSIS — I25810 Atherosclerosis of coronary artery bypass graft(s) without angina pectoris: Secondary | ICD-10-CM

## 2015-12-11 DIAGNOSIS — R079 Chest pain, unspecified: Secondary | ICD-10-CM

## 2015-12-11 LAB — MYOCARDIAL PERFUSION IMAGING
CHL CUP NUCLEAR SDS: 3
CHL CUP NUCLEAR SSS: 3
CHL CUP RESTING HR STRESS: 56 {beats}/min
CHL CUP STRESS STAGE 1 SPEED: 0 mph
CHL CUP STRESS STAGE 3 GRADE: 0 %
CHL CUP STRESS STAGE 3 HR: 62 {beats}/min
CHL CUP STRESS STAGE 3 SBP: 151 mmHg
CHL CUP STRESS STAGE 3 SPEED: 0 mph
CHL CUP STRESS STAGE 4 GRADE: 0 %
CHL CUP STRESS STAGE 4 HR: 83 {beats}/min
CHL CUP STRESS STAGE 4 SPEED: 0 mph
CHL CUP STRESS STAGE 5 DBP: 66 mmHg
CHL CUP STRESS STAGE 5 GRADE: 0 %
CHL CUP STRESS STAGE 6 DBP: 74 mmHg
CHL CUP STRESS STAGE 6 SPEED: 0 mph
CSEPEW: 1 METS
CSEPPHR: 83 {beats}/min
LHR: 0.28
LV dias vol: 128 mL (ref 62–150)
LV sys vol: 47 mL
NUC STRESS TID: 0.97
Percent of predicted max HR: 57 %
SRS: 0
Stage 1 DBP: 76 mmHg
Stage 1 Grade: 0 %
Stage 1 HR: 57 {beats}/min
Stage 1 SBP: 139 mmHg
Stage 2 Grade: 0 %
Stage 2 HR: 57 {beats}/min
Stage 2 Speed: 0 mph
Stage 3 DBP: 90 mmHg
Stage 5 HR: 77 {beats}/min
Stage 5 SBP: 136 mmHg
Stage 5 Speed: 0 mph
Stage 6 Grade: 0 %
Stage 6 HR: 71 {beats}/min
Stage 6 SBP: 142 mmHg

## 2015-12-11 MED ORDER — TECHNETIUM TC 99M TETROFOSMIN IV KIT
11.0000 | PACK | Freq: Once | INTRAVENOUS | Status: AC | PRN
  Administered 2015-12-11: 11 via INTRAVENOUS
  Filled 2015-12-11: qty 11

## 2015-12-11 MED ORDER — REGADENOSON 0.4 MG/5ML IV SOLN
0.4000 mg | Freq: Once | INTRAVENOUS | Status: AC
  Administered 2015-12-11: 0.4 mg via INTRAVENOUS

## 2015-12-11 MED ORDER — TECHNETIUM TC 99M TETROFOSMIN IV KIT
32.8000 | PACK | Freq: Once | INTRAVENOUS | Status: AC | PRN
  Administered 2015-12-11: 32.8 via INTRAVENOUS
  Filled 2015-12-11: qty 33

## 2016-02-24 DIAGNOSIS — I351 Nonrheumatic aortic (valve) insufficiency: Secondary | ICD-10-CM | POA: Diagnosis not present

## 2016-02-24 DIAGNOSIS — I350 Nonrheumatic aortic (valve) stenosis: Secondary | ICD-10-CM | POA: Diagnosis not present

## 2016-02-24 DIAGNOSIS — M545 Low back pain: Secondary | ICD-10-CM | POA: Diagnosis not present

## 2016-02-24 DIAGNOSIS — Z23 Encounter for immunization: Secondary | ICD-10-CM | POA: Diagnosis not present

## 2016-02-24 DIAGNOSIS — Z954 Presence of other heart-valve replacement: Secondary | ICD-10-CM | POA: Diagnosis not present

## 2016-02-24 DIAGNOSIS — N183 Chronic kidney disease, stage 3 (moderate): Secondary | ICD-10-CM | POA: Diagnosis not present

## 2016-02-24 DIAGNOSIS — Z79899 Other long term (current) drug therapy: Secondary | ICD-10-CM | POA: Diagnosis not present

## 2016-02-24 DIAGNOSIS — I119 Hypertensive heart disease without heart failure: Secondary | ICD-10-CM | POA: Diagnosis not present

## 2016-02-24 DIAGNOSIS — I2510 Atherosclerotic heart disease of native coronary artery without angina pectoris: Secondary | ICD-10-CM | POA: Diagnosis not present

## 2016-02-24 DIAGNOSIS — E291 Testicular hypofunction: Secondary | ICD-10-CM | POA: Diagnosis not present

## 2016-02-24 DIAGNOSIS — E119 Type 2 diabetes mellitus without complications: Secondary | ICD-10-CM | POA: Diagnosis not present

## 2016-02-24 DIAGNOSIS — E7800 Pure hypercholesterolemia, unspecified: Secondary | ICD-10-CM | POA: Diagnosis not present

## 2016-05-19 ENCOUNTER — Ambulatory Visit (HOSPITAL_COMMUNITY)
Admission: RE | Admit: 2016-05-19 | Discharge: 2016-05-19 | Disposition: A | Discharge status: Home / Self Care | Payer: Medicare Other | Source: Ambulatory Visit | Attending: Pulmonary Disease | Admitting: Pulmonary Disease

## 2016-05-19 ENCOUNTER — Other Ambulatory Visit (HOSPITAL_COMMUNITY): Payer: Self-pay | Admitting: Pulmonary Disease

## 2016-05-19 DIAGNOSIS — R0602 Shortness of breath: Secondary | ICD-10-CM

## 2016-05-19 DIAGNOSIS — I517 Cardiomegaly: Secondary | ICD-10-CM | POA: Diagnosis not present

## 2016-05-21 ENCOUNTER — Encounter: Payer: Self-pay | Admitting: Student

## 2016-05-21 ENCOUNTER — Telehealth: Payer: Self-pay | Admitting: Interventional Cardiology

## 2016-05-21 ENCOUNTER — Ambulatory Visit (INDEPENDENT_AMBULATORY_CARE_PROVIDER_SITE_OTHER): Payer: Medicare Other | Admitting: Student

## 2016-05-21 VITALS — BP 126/82 | HR 50 | Ht 70.0 in | Wt 201.6 lb

## 2016-05-21 DIAGNOSIS — I350 Nonrheumatic aortic (valve) stenosis: Secondary | ICD-10-CM

## 2016-05-21 DIAGNOSIS — I2510 Atherosclerotic heart disease of native coronary artery without angina pectoris: Secondary | ICD-10-CM | POA: Diagnosis not present

## 2016-05-21 DIAGNOSIS — I10 Essential (primary) hypertension: Secondary | ICD-10-CM | POA: Diagnosis not present

## 2016-05-21 DIAGNOSIS — R0609 Other forms of dyspnea: Secondary | ICD-10-CM | POA: Diagnosis not present

## 2016-05-21 MED ORDER — FUROSEMIDE 20 MG PO TABS: 20.0000 mg | ORAL_TABLET | Freq: Every day | ORAL | 3 refills | Status: DC

## 2016-05-21 MED ORDER — LOSARTAN POTASSIUM 100 MG PO TABS: 100.0000 mg | ORAL_TABLET | Freq: Every day | ORAL | 1 refills | Status: DC

## 2016-05-21 NOTE — Patient Instructions (Signed)
Medication Instructions:  STOP HYZAAR START LOSARTAN 100MG  DAILY START LASIX 20MG  DAILY  If you need a refill on your cardiac medications before your next appointment, please call your pharmacy.  Labwork: NONE  Testing/Procedures: Your physician has requested that you have an echocardiogram. Echocardiography is a painless test that uses sound waves to create images of your heart. It provides your doctor with information about the size and shape of your heart and how well your heart's chambers and valves are working. This procedure takes approximately one hour. There are no restrictions for this procedure.   Follow-Up: Your physician recommends that you schedule a follow-up appointment in: [place]   Thank you for choosing [facility], LPN Bernerd Pho, PA-C

## 2016-05-21 NOTE — Telephone Encounter (Signed)
Returned call to patient.He stated he has been having no energy,sob for the past 3 to 4 days.Stated he saw PCP, a cxr was done revealed enlarged heart.PCP advised him to see cardiologist.Stated he would like to be seen today.Appointment scheduled with Bernerd Pho PA today at 3:00 pm at [facility] office.

## 2016-05-21 NOTE — Telephone Encounter (Signed)
New message      Pt c/o Shortness Of Breath: STAT if SOB developed within the last 24 hours or pt is noticeably SOB on the phone  1. Are you currently SOB (can you hear that pt is SOB on the phone)?  no  2. How long have you been experiencing SOB?  About 1 week ago. Pt saw PCP wed for this problem, he had xray at PCP which showed  enlarged heart  3. Are you SOB when sitting or when up moving around?  Sob on exertion  4. Are you currently experiencing any other symptoms?  fatigue
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
## 2016-06-09 ENCOUNTER — Other Ambulatory Visit: Payer: Self-pay

## 2016-06-09 ENCOUNTER — Ambulatory Visit (HOSPITAL_COMMUNITY): Payer: Medicare Other | Attending: Cardiovascular Disease

## 2016-06-09 DIAGNOSIS — R0609 Other forms of dyspnea: Secondary | ICD-10-CM | POA: Diagnosis not present

## 2016-06-22 DIAGNOSIS — M25511 Pain in right shoulder: Secondary | ICD-10-CM | POA: Diagnosis not present

## 2016-06-22 DIAGNOSIS — E7800 Pure hypercholesterolemia, unspecified: Secondary | ICD-10-CM | POA: Diagnosis not present

## 2016-06-22 DIAGNOSIS — M545 Low back pain: Secondary | ICD-10-CM | POA: Diagnosis not present

## 2016-06-22 DIAGNOSIS — E119 Type 2 diabetes mellitus without complications: Secondary | ICD-10-CM | POA: Diagnosis not present

## 2016-06-22 DIAGNOSIS — N182 Chronic kidney disease, stage 2 (mild): Secondary | ICD-10-CM | POA: Diagnosis not present

## 2016-06-22 DIAGNOSIS — I2510 Atherosclerotic heart disease of native coronary artery without angina pectoris: Secondary | ICD-10-CM | POA: Diagnosis not present

## 2016-06-22 DIAGNOSIS — I350 Nonrheumatic aortic (valve) stenosis: Secondary | ICD-10-CM | POA: Diagnosis not present

## 2016-06-22 DIAGNOSIS — Z79899 Other long term (current) drug therapy: Secondary | ICD-10-CM | POA: Diagnosis not present

## 2016-06-22 DIAGNOSIS — I119 Hypertensive heart disease without heart failure: Secondary | ICD-10-CM | POA: Diagnosis not present

## 2016-06-22 DIAGNOSIS — M1990 Unspecified osteoarthritis, unspecified site: Secondary | ICD-10-CM | POA: Diagnosis not present

## 2016-06-22 DIAGNOSIS — I358 Other nonrheumatic aortic valve disorders: Secondary | ICD-10-CM | POA: Diagnosis not present

## 2016-06-22 DIAGNOSIS — I351 Nonrheumatic aortic (valve) insufficiency: Secondary | ICD-10-CM | POA: Diagnosis not present

## 2016-06-23 DIAGNOSIS — N5201 Erectile dysfunction due to arterial insufficiency: Secondary | ICD-10-CM | POA: Diagnosis not present

## 2016-06-23 DIAGNOSIS — N401 Enlarged prostate with lower urinary tract symptoms: Secondary | ICD-10-CM | POA: Diagnosis not present

## 2016-06-23 DIAGNOSIS — R351 Nocturia: Secondary | ICD-10-CM | POA: Diagnosis not present

## 2016-06-26 ENCOUNTER — Other Ambulatory Visit (HOSPITAL_COMMUNITY): Payer: Self-pay | Admitting: Pulmonary Disease

## 2016-06-26 ENCOUNTER — Ambulatory Visit (HOSPITAL_COMMUNITY)
Admission: RE | Admit: 2016-06-26 | Discharge: 2016-06-26 | Disposition: A | Discharge status: Home / Self Care | Payer: Medicare Other | Source: Ambulatory Visit | Attending: Pulmonary Disease | Admitting: Pulmonary Disease

## 2016-06-26 DIAGNOSIS — I700 Atherosclerosis of aorta: Secondary | ICD-10-CM | POA: Diagnosis not present

## 2016-06-26 DIAGNOSIS — M47812 Spondylosis without myelopathy or radiculopathy, cervical region: Secondary | ICD-10-CM | POA: Diagnosis not present

## 2016-06-26 DIAGNOSIS — M19011 Primary osteoarthritis, right shoulder: Secondary | ICD-10-CM | POA: Diagnosis not present

## 2016-06-26 DIAGNOSIS — M479 Spondylosis, unspecified: Secondary | ICD-10-CM | POA: Insufficient documentation

## 2016-06-26 DIAGNOSIS — M129 Arthropathy, unspecified: Secondary | ICD-10-CM | POA: Diagnosis not present

## 2016-06-26 DIAGNOSIS — M4804 Spinal stenosis, thoracic region: Secondary | ICD-10-CM | POA: Diagnosis not present

## 2016-06-26 DIAGNOSIS — Z952 Presence of prosthetic heart valve: Secondary | ICD-10-CM | POA: Diagnosis not present

## 2016-06-26 DIAGNOSIS — M47814 Spondylosis without myelopathy or radiculopathy, thoracic region: Secondary | ICD-10-CM | POA: Diagnosis not present

## 2016-06-26 DIAGNOSIS — M4802 Spinal stenosis, cervical region: Secondary | ICD-10-CM | POA: Diagnosis not present

## 2016-06-26 DIAGNOSIS — R52 Pain, unspecified: Secondary | ICD-10-CM | POA: Insufficient documentation

## 2016-07-08 ENCOUNTER — Encounter: Payer: Self-pay | Admitting: Interventional Cardiology

## 2016-07-14 ENCOUNTER — Encounter: Payer: Self-pay | Admitting: Interventional Cardiology

## 2016-07-14 ENCOUNTER — Ambulatory Visit (INDEPENDENT_AMBULATORY_CARE_PROVIDER_SITE_OTHER): Payer: Medicare Other | Admitting: Interventional Cardiology

## 2016-07-14 VITALS — BP 106/70 | HR 54 | Ht 70.0 in | Wt 198.4 lb

## 2016-07-14 DIAGNOSIS — R001 Bradycardia, unspecified: Secondary | ICD-10-CM | POA: Diagnosis not present

## 2016-07-14 DIAGNOSIS — I10 Essential (primary) hypertension: Secondary | ICD-10-CM | POA: Diagnosis not present

## 2016-07-14 DIAGNOSIS — Z952 Presence of prosthetic heart valve: Secondary | ICD-10-CM

## 2016-07-14 DIAGNOSIS — I2510 Atherosclerotic heart disease of native coronary artery without angina pectoris: Secondary | ICD-10-CM

## 2016-07-14 DIAGNOSIS — I5032 Chronic diastolic (congestive) heart failure: Secondary | ICD-10-CM

## 2016-07-14 MED ORDER — METOPROLOL TARTRATE 25 MG PO TABS: 25.0000 mg | ORAL_TABLET | Freq: Two times a day (BID) | ORAL | 3 refills | Status: DC

## 2016-07-14 MED ORDER — LOSARTAN POTASSIUM-HCTZ 100-25 MG PO TABS: 1.0000 | ORAL_TABLET | Freq: Every day | ORAL | 3 refills | Status: DC

## 2016-07-14 NOTE — Patient Instructions (Signed)
Medication Instructions:  1) DISCONTINUE Cozaar and Furosemide 2) DECREASE Metoprolol Tartrate to 25mg  twice daily 3) START Hyzaar 100/25mg  once daily  Labwork: Your physician recommends that you return for lab work in: 2 weeks (BMET)   Testing/Procedures: None  Follow-Up: Your physician wants you to follow-up in: 6 months with [name] with an EKG.  You will receive a reminder letter in the mail two months in advance. If you don't receive a letter, please call our office to schedule the follow-up appointment.   Any Other Special Instructions Will Be Listed Below (If Applicable).  Please call our office in 2 weeks and let us know how you are feeling.  Specifically let us know how your energy and breathing are.    If you need a refill on your cardiac medications before your next appointment, please call your pharmacy.
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
## 2016-07-15 DIAGNOSIS — G8929 Other chronic pain: Secondary | ICD-10-CM | POA: Diagnosis not present

## 2016-07-15 DIAGNOSIS — M5416 Radiculopathy, lumbar region: Secondary | ICD-10-CM | POA: Diagnosis not present

## 2016-07-15 DIAGNOSIS — M48061 Spinal stenosis, lumbar region without neurogenic claudication: Secondary | ICD-10-CM | POA: Diagnosis not present

## 2016-07-15 DIAGNOSIS — M5441 Lumbago with sciatica, right side: Secondary | ICD-10-CM | POA: Diagnosis not present

## 2016-07-28 ENCOUNTER — Other Ambulatory Visit: Payer: Medicare Other | Admitting: *Deleted

## 2016-07-28 DIAGNOSIS — I2510 Atherosclerotic heart disease of native coronary artery without angina pectoris: Secondary | ICD-10-CM

## 2016-07-28 DIAGNOSIS — Z952 Presence of prosthetic heart valve: Secondary | ICD-10-CM | POA: Diagnosis not present

## 2016-07-28 DIAGNOSIS — I5032 Chronic diastolic (congestive) heart failure: Secondary | ICD-10-CM

## 2016-07-28 DIAGNOSIS — I10 Essential (primary) hypertension: Secondary | ICD-10-CM

## 2016-07-28 LAB — BASIC METABOLIC PANEL
BUN / CREAT RATIO: 13 (ref 10–24)
BUN: 16 mg/dL (ref 8–27)
CO2: 27 mmol/L (ref 18–29)
Calcium: 9.7 mg/dL (ref 8.6–10.2)
Chloride: 98 mmol/L (ref 96–106)
Creatinine, Ser: 1.23 mg/dL (ref 0.76–1.27)
GFR calc Af Amer: 66 mL/min/{1.73_m2} (ref >59)
GFR calc non Af Amer: 57 mL/min/{1.73_m2} — ABNORMAL LOW (ref >59)
GLUCOSE: 98 mg/dL (ref 65–99)
Potassium: 3.6 mmol/L (ref 3.5–5.2)
SODIUM: 140 mmol/L (ref 134–144)

## 2016-08-03 DIAGNOSIS — M48061 Spinal stenosis, lumbar region without neurogenic claudication: Secondary | ICD-10-CM | POA: Diagnosis not present

## 2016-08-27 ENCOUNTER — Telehealth: Payer: Self-pay | Admitting: Interventional Cardiology

## 2016-08-27 NOTE — Telephone Encounter (Signed)
Patient satates that he is no longer having exertional fatigue or dyspnea. Patient advised to stay off of the metoprolol. Patient instructed to monitor his BP and to plan to see him in September unless he needs [place] before then. Patient verbalizes understanding and is in agreement with this plan.

## 2016-08-27 NOTE — Telephone Encounter (Signed)
New message   Pt verbalized that he is calling for rn   because he has stopped taking the medication   Metoprolol 50mg  and that he wants to know when   should he proceed to take it again or should he stay off of it  He verbalized that he feels very will off of the medicaiton

## 2016-08-27 NOTE — Telephone Encounter (Signed)
Patient calling and states that [name] instructed him on 07/30/16 to decrease his metoprolol to 25 mg daily for 2 weeks and then to d/c. Patient states that he is off of the metoprolol now. He states that he feels better and has not had any issues and denies any cardiac symptoms. Patient asking if he should stay off of the metoprolol or if it should be restarted. Patient reports his last BP as 103/83 and HR 83. Patient advised to stay off of the metoprolol for now and information would be forwarded to [name] for review.

## 2016-08-27 NOTE — Telephone Encounter (Signed)
I am interested in whether or not exertional fatigue and dyspnea is better? If so, metoprolol should be discontinued as instructed. He should keep an eye on his blood pressure. I would otherwise see him again at next regularly scheduled visit.

## 2016-09-09 DIAGNOSIS — M545 Low back pain: Secondary | ICD-10-CM | POA: Diagnosis not present

## 2016-09-09 DIAGNOSIS — E119 Type 2 diabetes mellitus without complications: Secondary | ICD-10-CM | POA: Diagnosis not present

## 2016-09-09 DIAGNOSIS — I351 Nonrheumatic aortic (valve) insufficiency: Secondary | ICD-10-CM | POA: Diagnosis not present

## 2016-09-09 DIAGNOSIS — I119 Hypertensive heart disease without heart failure: Secondary | ICD-10-CM | POA: Diagnosis not present

## 2016-09-09 DIAGNOSIS — N182 Chronic kidney disease, stage 2 (mild): Secondary | ICD-10-CM | POA: Diagnosis not present

## 2016-09-09 DIAGNOSIS — I2510 Atherosclerotic heart disease of native coronary artery without angina pectoris: Secondary | ICD-10-CM | POA: Diagnosis not present

## 2016-09-09 DIAGNOSIS — Z125 Encounter for screening for malignant neoplasm of prostate: Secondary | ICD-10-CM | POA: Diagnosis not present

## 2016-09-09 DIAGNOSIS — M159 Polyosteoarthritis, unspecified: Secondary | ICD-10-CM | POA: Diagnosis not present

## 2016-09-09 DIAGNOSIS — I358 Other nonrheumatic aortic valve disorders: Secondary | ICD-10-CM | POA: Diagnosis not present

## 2016-09-09 DIAGNOSIS — Z79899 Other long term (current) drug therapy: Secondary | ICD-10-CM | POA: Diagnosis not present

## 2016-09-09 DIAGNOSIS — E7800 Pure hypercholesterolemia, unspecified: Secondary | ICD-10-CM | POA: Diagnosis not present

## 2016-10-13 ENCOUNTER — Other Ambulatory Visit: Payer: Self-pay | Admitting: Interventional Cardiology

## 2016-10-13 DIAGNOSIS — R42 Dizziness and giddiness: Secondary | ICD-10-CM

## 2016-10-13 NOTE — Telephone Encounter (Signed)
Needs to come from PCP.  Thanks! 

## 2017-01-13 DIAGNOSIS — I350 Nonrheumatic aortic (valve) stenosis: Secondary | ICD-10-CM | POA: Diagnosis not present

## 2017-01-13 DIAGNOSIS — E119 Type 2 diabetes mellitus without complications: Secondary | ICD-10-CM | POA: Diagnosis not present

## 2017-01-13 DIAGNOSIS — Z79899 Other long term (current) drug therapy: Secondary | ICD-10-CM | POA: Diagnosis not present

## 2017-01-13 DIAGNOSIS — E7800 Pure hypercholesterolemia, unspecified: Secondary | ICD-10-CM | POA: Diagnosis not present

## 2017-01-13 DIAGNOSIS — Z0001 Encounter for general adult medical examination with abnormal findings: Secondary | ICD-10-CM | POA: Diagnosis not present

## 2017-01-13 DIAGNOSIS — N182 Chronic kidney disease, stage 2 (mild): Secondary | ICD-10-CM | POA: Diagnosis not present

## 2017-01-13 DIAGNOSIS — T829XXS Unspecified complication of cardiac and vascular prosthetic device, implant and graft, sequela: Secondary | ICD-10-CM | POA: Diagnosis not present

## 2017-01-13 DIAGNOSIS — M899 Disorder of bone, unspecified: Secondary | ICD-10-CM | POA: Diagnosis not present

## 2017-01-13 DIAGNOSIS — I119 Hypertensive heart disease without heart failure: Secondary | ICD-10-CM | POA: Diagnosis not present

## 2017-01-13 DIAGNOSIS — I110 Hypertensive heart disease with heart failure: Secondary | ICD-10-CM | POA: Diagnosis not present

## 2017-01-13 DIAGNOSIS — M545 Low back pain: Secondary | ICD-10-CM | POA: Diagnosis not present

## 2017-01-13 DIAGNOSIS — I2510 Atherosclerotic heart disease of native coronary artery without angina pectoris: Secondary | ICD-10-CM | POA: Diagnosis not present
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
## 2017-01-17 ENCOUNTER — Telehealth: Payer: Self-pay | Admitting: Interventional Cardiology

## 2017-01-17 ENCOUNTER — Encounter: Payer: Self-pay | Admitting: Interventional Cardiology

## 2017-01-17 ENCOUNTER — Ambulatory Visit (INDEPENDENT_AMBULATORY_CARE_PROVIDER_SITE_OTHER): Payer: Medicare Other | Admitting: Interventional Cardiology

## 2017-01-17 VITALS — BP 128/82 | HR 60 | Ht 70.0 in | Wt 196.8 lb

## 2017-01-17 DIAGNOSIS — I10 Essential (primary) hypertension: Secondary | ICD-10-CM | POA: Diagnosis not present

## 2017-01-17 DIAGNOSIS — I25118 Atherosclerotic heart disease of native coronary artery with other forms of angina pectoris: Secondary | ICD-10-CM | POA: Diagnosis not present

## 2017-01-17 DIAGNOSIS — I2510 Atherosclerotic heart disease of native coronary artery without angina pectoris: Secondary | ICD-10-CM

## 2017-01-17 DIAGNOSIS — Z952 Presence of prosthetic heart valve: Secondary | ICD-10-CM | POA: Diagnosis not present

## 2017-01-17 DIAGNOSIS — I209 Angina pectoris, unspecified: Secondary | ICD-10-CM | POA: Diagnosis not present

## 2017-01-17 DIAGNOSIS — I5032 Chronic diastolic (congestive) heart failure: Secondary | ICD-10-CM | POA: Diagnosis not present

## 2017-01-17 LAB — BASIC METABOLIC PANEL
BUN/Creatinine Ratio: 10 (ref 10–24)
BUN: 15 mg/dL (ref 8–27)
CALCIUM: 9.3 mg/dL (ref 8.6–10.2)
CHLORIDE: 103 mmol/L (ref 96–106)
CO2: 25 mmol/L (ref 20–29)
Creatinine, Ser: 1.49 mg/dL — ABNORMAL HIGH (ref 0.76–1.27)
GFR calc Af Amer: 52 mL/min/{1.73_m2} — ABNORMAL LOW (ref >59)
GFR calc non Af Amer: 45 mL/min/{1.73_m2} — ABNORMAL LOW (ref >59)
GLUCOSE: 78 mg/dL (ref 65–99)
Potassium: 4.2 mmol/L (ref 3.5–5.2)
Sodium: 139 mmol/L (ref 134–144)

## 2017-01-17 MED ORDER — LOSARTAN POTASSIUM 100 MG PO TABS: 100.0000 mg | ORAL_TABLET | Freq: Every day | ORAL | 3 refills | Status: DC

## 2017-01-17 MED ORDER — METOPROLOL SUCCINATE ER 25 MG PO TB24: 25.0000 mg | ORAL_TABLET | Freq: Every day | ORAL | 3 refills | Status: DC

## 2017-01-17 NOTE — Telephone Encounter (Signed)
Follow up    The furosemide is 20 mg

## 2017-01-17 NOTE — Telephone Encounter (Signed)
Spoke with pt and he states that he is unable to locate his Furosemide bottle.  Sorts medications into pill organizer when he gets them and then tosses bottle.  Pt will call his pharmacy and find out information and call me back.

## 2017-01-17 NOTE — Telephone Encounter (Signed)
Will route to [name] to let him know pt taking Furosemide 20mg  QD.  Will update med list.

## 2017-01-17 NOTE — Telephone Encounter (Signed)
New message    Pt is calling stating he is calling in some information for [facility]. Please call.

## 2017-01-17 NOTE — Patient Instructions (Signed)
Medication Instructions:  1) CHANGE your Metoprolol Tartrate to Metoprolol Succinate 25mg  once daily. 2) Call me with what dose of Furosemide you are taking.  Labwork: BMET today  Testing/Procedures: None  Follow-Up: Your physician wants you to follow-up in: 1 year with [name].  You will receive a reminder letter in the mail two months in advance. If you don't receive a letter, please call our office to schedule the follow-up appointment.     Any Other Special Instructions Will Be Listed Below (If Applicable).  Watch the amount of carbohydrates you are taking in!   Low-Sodium Eating Plan Sodium, which is an element that makes up salt, helps you maintain a healthy balance of fluids in your body. Too much sodium can increase your blood pressure and cause fluid and waste to be held in your body. Your health care provider or dietitian may recommend following this plan if you have high blood pressure (hypertension), kidney disease, liver disease, or heart failure. Eating less sodium can help lower your blood pressure, reduce swelling, and protect your heart, liver, and kidneys. What are tips for following this plan? General guidelines  Most people on this plan should limit their sodium intake to 1,500-2,000 mg (milligrams) of sodium each day. Reading food labels  The Nutrition Facts label lists the amount of sodium in one serving of the food. If you eat more than one serving, you must multiply the listed amount of sodium by the number of servings.  Choose foods with less than 140 mg of sodium per serving.  Avoid foods with 300 mg of sodium or more per serving. Shopping  Look for lower-sodium products, often labeled as "low-sodium" or "no salt added."  Always check the sodium content even if foods are labeled as "unsalted" or "no salt added".  Buy fresh foods. ? Avoid canned foods and premade or frozen meals. ? Avoid canned, cured, or processed meats  Buy breads that have  less than 80 mg of sodium per slice. Cooking  Eat more home-cooked food and less restaurant, buffet, and fast food.  Avoid adding salt when cooking. Use salt-free seasonings or herbs instead of table salt or sea salt. Check with your health care provider or pharmacist before using salt substitutes.  Cook with plant-based oils, such as canola, sunflower, or olive oil. Meal planning  When eating at a restaurant, ask that your food be prepared with less salt or no salt, if possible.  Avoid foods that contain MSG (monosodium glutamate). MSG is sometimes added to [place] food, bouillon, and some canned foods. What foods are recommended? The items listed may not be a complete list. Talk with your dietitian about what dietary choices are best for you. Grains Low-sodium cereals, including oats, puffed wheat and rice, and shredded wheat. Low-sodium crackers. Unsalted rice. Unsalted pasta. Low-sodium bread. Whole-grain breads and whole-grain pasta. Vegetables Fresh or frozen vegetables. "No salt added" canned vegetables. "No salt added" tomato sauce and paste. Low-sodium or reduced-sodium tomato and vegetable juice. Fruits Fresh, frozen, or canned fruit. Fruit juice. Meats and other protein foods Fresh or frozen (no salt added) meat, poultry, seafood, and fish. Low-sodium canned tuna and salmon. Unsalted nuts. Dried peas, beans, and lentils without added salt. Unsalted canned beans. Eggs. Unsalted nut butters. Dairy Milk. Soy milk. Cheese that is naturally low in sodium, such as ricotta cheese, fresh mozzarella, or Swiss cheese Low-sodium or reduced-sodium cheese. Cream cheese. Yogurt. Fats and oils Unsalted butter. Unsalted margarine with no trans fat. Vegetable oils such  as canola or olive oils. Seasonings and other foods Fresh and dried herbs and spices. Salt-free seasonings. Low-sodium mustard and ketchup. Sodium-free salad dressing. Sodium-free light mayonnaise. Fresh or refrigerated  horseradish. Lemon juice. Vinegar. Homemade, reduced-sodium, or low-sodium soups. Unsalted popcorn and pretzels. Low-salt or salt-free chips. What foods are not recommended? The items listed may not be a complete list. Talk with your dietitian about what dietary choices are best for you. Grains Instant hot cereals. Bread stuffing, pancake, and biscuit mixes. Croutons. Seasoned rice or pasta mixes. Noodle soup cups. Boxed or frozen macaroni and cheese. Regular salted crackers. Self-rising flour. Vegetables Sauerkraut, pickled vegetables, and relishes. Olives. [place] fries. Onion rings. Regular canned vegetables (not low-sodium or reduced-sodium). Regular canned tomato sauce and paste (not low-sodium or reduced-sodium). Regular tomato and vegetable juice (not low-sodium or reduced-sodium). Frozen vegetables in sauces. Meats and other protein foods Meat or fish that is salted, canned, smoked, spiced, or pickled. Bacon, ham, sausage, hotdogs, corned beef, chipped beef, packaged lunch meats, salt pork, jerky, pickled herring, anchovies, regular canned tuna, sardines, salted nuts. Dairy Processed cheese and cheese spreads. Cheese curds. Blue cheese. Feta cheese. String cheese. Regular cottage cheese. Buttermilk. Canned milk. Fats and oils Salted butter. Regular margarine. Ghee. Bacon fat. Seasonings and other foods Onion salt, garlic salt, seasoned salt, table salt, and sea salt. Canned and packaged gravies. Worcestershire sauce. Tartar sauce. Barbecue sauce. Teriyaki sauce. Soy sauce, including reduced-sodium. Steak sauce. Fish sauce. Oyster sauce. Cocktail sauce. Horseradish that you find on the shelf. Regular ketchup and mustard. Meat flavorings and tenderizers. Bouillon cubes. Hot sauce and Tabasco sauce. Premade or packaged marinades. Premade or packaged taco seasonings. Relishes. Regular salad dressings. Salsa. Potato and tortilla chips. Corn chips and puffs. Salted popcorn and pretzels. Canned or  dried soups. Pizza. Frozen entrees and pot pies. Summary  Eating less sodium can help lower your blood pressure, reduce swelling, and protect your heart, liver, and kidneys.  Most people on this plan should limit their sodium intake to 1,500-2,000 mg (milligrams) of sodium each day.  Canned, boxed, and frozen foods are high in sodium. Restaurant foods, fast foods, and pizza are also very high in sodium. You also get sodium by adding salt to food.  Try to cook at home, eat more fresh fruits and vegetables, and eat less fast food, canned, processed, or prepared foods. This information is not intended to replace advice given to you by your health care provider. Make sure you discuss any questions you have with your health care provider. Document Released: 10/16/2001 Document Revised: 04/19/2016 Document Reviewed: 04/19/2016 Elsevier Interactive Patient Education  2017 [employer].    If you need a refill on your cardiac medications before your next appointment, please call your pharmacy.

## 2017-01-18 NOTE — Telephone Encounter (Signed)
Continue meds as per his current regimen at home.

## 2017-01-21 ENCOUNTER — Telehealth: Payer: Self-pay | Admitting: Interventional Cardiology

## 2017-01-21 NOTE — Telephone Encounter (Signed)
Informed pt of lab results. Pt verbalized understanding. 

## 2017-01-21 NOTE — Telephone Encounter (Signed)
New message ° ° ° °Pt is returning call to Jennifer. °

## 2017-04-07 ENCOUNTER — Encounter: Payer: Self-pay | Admitting: Podiatry

## 2017-04-07 ENCOUNTER — Ambulatory Visit: Payer: Medicare Other

## 2017-04-07 ENCOUNTER — Ambulatory Visit (INDEPENDENT_AMBULATORY_CARE_PROVIDER_SITE_OTHER): Payer: Medicare Other | Admitting: Podiatry

## 2017-04-07 VITALS — BP 134/75 | HR 51 | Ht 68.0 in | Wt 204.0 lb

## 2017-04-07 DIAGNOSIS — M205X1 Other deformities of toe(s) (acquired), right foot: Secondary | ICD-10-CM | POA: Diagnosis not present

## 2017-04-07 DIAGNOSIS — M79672 Pain in left foot: Principal | ICD-10-CM

## 2017-04-07 DIAGNOSIS — M779 Enthesopathy, unspecified: Secondary | ICD-10-CM

## 2017-04-07 DIAGNOSIS — M7750 Other enthesopathy of unspecified foot: Secondary | ICD-10-CM | POA: Diagnosis not present

## 2017-04-07 DIAGNOSIS — M79671 Pain in right foot: Secondary | ICD-10-CM
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
## 2017-04-14 ENCOUNTER — Other Ambulatory Visit: Payer: Self-pay | Admitting: Interventional Cardiology

## 2017-04-14 MED ORDER — FUROSEMIDE 20 MG PO TABS: 20.0000 mg | ORAL_TABLET | Freq: Every day | ORAL | 2 refills | Status: DC

## 2017-04-28 ENCOUNTER — Ambulatory Visit (INDEPENDENT_AMBULATORY_CARE_PROVIDER_SITE_OTHER): Payer: Medicare Other | Admitting: Podiatry

## 2017-04-28 DIAGNOSIS — M7672 Peroneal tendinitis, left leg: Secondary | ICD-10-CM

## 2017-04-28 DIAGNOSIS — M779 Enthesopathy, unspecified: Secondary | ICD-10-CM | POA: Diagnosis not present

## 2017-04-28 DIAGNOSIS — M205X1 Other deformities of toe(s) (acquired), right foot: Secondary | ICD-10-CM | POA: Diagnosis not present

## 2017-05-09 NOTE — Progress Notes (Signed)
  Subjective:  Patient ID: [name], male    DOB: 19-Apr-1941,  MRN: [id number]  76 y.o. male returns for follow-up bilateral foot pain.  Reports improving pain at the left foot.  Continued pain to the right foot.  Objective:  There were no vitals filed for this visit. General AA&O x3. Normal mood and affect.  Vascular Pedal pulses palpable.  Neurologic Epicritic sensation grossly intact.  Dermatologic No open lesions. Skin normal texture and turgor.  Orthopedic:  Pain and range of motion right great toe   Assessment & Plan:  Patient was evaluated and treated and all questions answered.  Left peroneus brevis enthesitis -Improved no injection today.  Right hallux limitus -Injection delivered as below  Procedure: Joint Injection Location: Right 1st MPJt Skin Prep: Alcohol. Injectate: 0.5 cc 1% lidocaine plain, 0.5 cc dexamethasone phosphate. Disposition: Patient tolerated procedure well. Injection site dressed with a band-aid.  Return in about 3 weeks (around 05/19/2017) for Hallux Limitus.

## 2017-05-16 DIAGNOSIS — T829XXS Unspecified complication of cardiac and vascular prosthetic device, implant and graft, sequela: Secondary | ICD-10-CM | POA: Diagnosis not present

## 2017-05-16 DIAGNOSIS — E7800 Pure hypercholesterolemia, unspecified: Secondary | ICD-10-CM | POA: Diagnosis not present

## 2017-05-16 DIAGNOSIS — I351 Nonrheumatic aortic (valve) insufficiency: Secondary | ICD-10-CM | POA: Diagnosis not present

## 2017-05-16 DIAGNOSIS — I2510 Atherosclerotic heart disease of native coronary artery without angina pectoris: Secondary | ICD-10-CM | POA: Diagnosis not present

## 2017-05-16 DIAGNOSIS — I119 Hypertensive heart disease without heart failure: Secondary | ICD-10-CM | POA: Diagnosis not present

## 2017-05-16 DIAGNOSIS — I350 Nonrheumatic aortic (valve) stenosis: Secondary | ICD-10-CM | POA: Diagnosis not present

## 2017-05-16 DIAGNOSIS — M899 Disorder of bone, unspecified: Secondary | ICD-10-CM | POA: Diagnosis not present

## 2017-05-16 DIAGNOSIS — Z79899 Other long term (current) drug therapy: Secondary | ICD-10-CM | POA: Diagnosis not present

## 2017-05-16 DIAGNOSIS — E1165 Type 2 diabetes mellitus with hyperglycemia: Secondary | ICD-10-CM | POA: Diagnosis not present

## 2017-05-16 DIAGNOSIS — M1990 Unspecified osteoarthritis, unspecified site: Secondary | ICD-10-CM | POA: Diagnosis not present

## 2017-05-16 DIAGNOSIS — N182 Chronic kidney disease, stage 2 (mild): Secondary | ICD-10-CM | POA: Diagnosis not present

## 2017-05-16 DIAGNOSIS — N401 Enlarged prostate with lower urinary tract symptoms: Secondary | ICD-10-CM | POA: Diagnosis not present

## 2017-05-19 ENCOUNTER — Ambulatory Visit: Payer: BC Managed Care – PPO | Admitting: Podiatry

## 2017-05-26 ENCOUNTER — Ambulatory Visit (INDEPENDENT_AMBULATORY_CARE_PROVIDER_SITE_OTHER): Payer: Medicare Other | Admitting: Podiatry

## 2017-05-26 ENCOUNTER — Encounter: Payer: Self-pay | Admitting: Podiatry

## 2017-05-26 DIAGNOSIS — M779 Enthesopathy, unspecified: Secondary | ICD-10-CM | POA: Diagnosis not present

## 2017-05-26 DIAGNOSIS — M205X1 Other deformities of toe(s) (acquired), right foot: Secondary | ICD-10-CM | POA: Diagnosis not present

## 2017-05-26 DIAGNOSIS — M7672 Peroneal tendinitis, left leg: Secondary | ICD-10-CM | POA: Diagnosis not present

## 2017-05-26 NOTE — Progress Notes (Signed)
  Subjective:  Patient ID: REGNALD BOWENS, male    DOB: 1940/10/08,  MRN: [id number]  Chief Complaint  Patient presents with  . Foot Pain    3 week follow up Hallux limitus Right - pt stated "pain has improved although still having pain that comes and goes"    77 y.o. male returns for the above complaint. States the L foot is no longer hurting him. Still reports occasional pain in the R great toe joint.  Objective:  There were no vitals filed for this visit. General AA&O x3. Normal mood and affect.  Vascular Pedal pulses palpable.  Neurologic Epicritic sensation grossly intact.  Dermatologic No open lesions. Skin normal texture and turgor.  Orthopedic: Crepitus, pain on ROM of R 1st MPJ.   Assessment & Plan:  Patient was evaluated and treated and all questions answered.  Hallux Limitus R -Offered injection. Declined. -Will f/u PRN for injection / further surgical discussion.  Return if symptoms worsen or fail to improve.

## 2017-06-20 DIAGNOSIS — N401 Enlarged prostate with lower urinary tract symptoms: Secondary | ICD-10-CM | POA: Diagnosis not present

## 2017-06-27 DIAGNOSIS — N5201 Erectile dysfunction due to arterial insufficiency: Secondary | ICD-10-CM | POA: Diagnosis not present

## 2017-06-27 DIAGNOSIS — N401 Enlarged prostate with lower urinary tract symptoms: Secondary | ICD-10-CM | POA: Diagnosis not present

## 2017-06-27 DIAGNOSIS — R350 Frequency of micturition: Secondary | ICD-10-CM | POA: Diagnosis not present

## 2017-07-12 ENCOUNTER — Telehealth: Payer: Self-pay | Admitting: Interventional Cardiology

## 2017-07-12 NOTE — Telephone Encounter (Signed)
Spoke with pt and advised him to contact his pharmacy to see if his prescription of Losartan was part of the recall.  Pt concerned that BP has been 130-140/80's.  Advised pt we want his BP 140/90 or lower.  Advised pt to continue monitoring BP and if consistently 140/90 or higher to let us know.  Pt appreciative for call.

## 2017-07-12 NOTE — Telephone Encounter (Signed)
New message  Pt c/o medication issue:  1. Name of Medication: losartan (COZAAR) 100 MG tablet(Expired)  2. How are you currently taking this medication (dosage and times per day)? Take 1 tablet (100 mg total) by mouth daily.  3. Are you having a reaction (difficulty breathing--STAT)? no  4. What is your medication issue? Pt verbalized that he seen that the medications he is taking causes cancer and its not holding his bp down like the Cozaar. Bp averaging 140/82. Please call

## 2017-07-28 ENCOUNTER — Ambulatory Visit (INDEPENDENT_AMBULATORY_CARE_PROVIDER_SITE_OTHER): Payer: Medicare Other | Admitting: Podiatry

## 2017-07-28 DIAGNOSIS — M19079 Primary osteoarthritis, unspecified ankle and foot: Secondary | ICD-10-CM

## 2017-07-28 MED ORDER — MELOXICAM 7.5 MG PO TABS: 7.5000 mg | ORAL_TABLET | Freq: Every day | ORAL | 0 refills | Status: DC

## 2017-07-28 NOTE — Progress Notes (Signed)
  Subjective:  Patient ID: [name], male    DOB: 11/11/1940,  MRN: [id number]  Chief Complaint  Patient presents with  . Toe Pain    Left great toe pain in first and second joint, just like the right toe in the past. Xrays done in Novemebr 2018   77 y.o. male returns for the above complaint.  States the right foot is doing ok but the left is hurting him again.  Objective:  There were no vitals filed for this visit. General AA&O x3. Normal mood and affect.  Vascular Pedal pulses palpable.  Neurologic Epicritic sensation grossly intact.  Dermatologic No open lesions. Skin normal texture and turgor.  Orthopedic: Crepitus, pain on ROM of L 1st MPJ.   Assessment & Plan:  Patient was evaluated and treated and all questions answered.  Hallux Limitus L -Injection as below.  Procedure: Joint Injection Location: Left 1st MPJ joint Skin Prep: Alcohol. Injectate: 0.5 cc 1% lidocaine plain, 0.5 cc dexamethasone phosphate. Disposition: Patient tolerated procedure well. Injection site dressed with a band-aid.    Return in about 3 weeks (around 08/18/2017) for Hallux limitus f/u.

## 2017-08-15 DIAGNOSIS — N182 Chronic kidney disease, stage 2 (mild): Secondary | ICD-10-CM | POA: Diagnosis not present

## 2017-08-15 DIAGNOSIS — M899 Disorder of bone, unspecified: Secondary | ICD-10-CM | POA: Diagnosis not present

## 2017-08-15 DIAGNOSIS — N400 Enlarged prostate without lower urinary tract symptoms: Secondary | ICD-10-CM | POA: Diagnosis not present

## 2017-08-15 DIAGNOSIS — M159 Polyosteoarthritis, unspecified: Secondary | ICD-10-CM | POA: Diagnosis not present

## 2017-08-15 DIAGNOSIS — I119 Hypertensive heart disease without heart failure: Secondary | ICD-10-CM | POA: Diagnosis not present

## 2017-08-15 DIAGNOSIS — I350 Nonrheumatic aortic (valve) stenosis: Secondary | ICD-10-CM | POA: Diagnosis not present

## 2017-08-15 DIAGNOSIS — Z79899 Other long term (current) drug therapy: Secondary | ICD-10-CM | POA: Diagnosis not present

## 2017-08-15 DIAGNOSIS — I2510 Atherosclerotic heart disease of native coronary artery without angina pectoris: Secondary | ICD-10-CM | POA: Diagnosis not present

## 2017-08-15 DIAGNOSIS — I351 Nonrheumatic aortic (valve) insufficiency: Secondary | ICD-10-CM | POA: Diagnosis not present

## 2017-08-15 DIAGNOSIS — M25512 Pain in left shoulder: Secondary | ICD-10-CM | POA: Diagnosis not present

## 2017-08-15 DIAGNOSIS — E7800 Pure hypercholesterolemia, unspecified: Secondary | ICD-10-CM | POA: Diagnosis not present

## 2017-08-15 DIAGNOSIS — E119 Type 2 diabetes mellitus without complications: Secondary | ICD-10-CM | POA: Diagnosis not present

## 2017-08-18 ENCOUNTER — Ambulatory Visit: Payer: Medicare Other | Admitting: Podiatry

## 2017-10-10 DIAGNOSIS — I119 Hypertensive heart disease without heart failure: Secondary | ICD-10-CM | POA: Diagnosis not present

## 2017-10-10 DIAGNOSIS — E7800 Pure hypercholesterolemia, unspecified: Secondary | ICD-10-CM | POA: Diagnosis not present

## 2017-10-10 DIAGNOSIS — I2510 Atherosclerotic heart disease of native coronary artery without angina pectoris: Secondary | ICD-10-CM | POA: Diagnosis not present

## 2017-10-10 DIAGNOSIS — E119 Type 2 diabetes mellitus without complications: Secondary | ICD-10-CM | POA: Diagnosis not present

## 2017-10-10 DIAGNOSIS — N400 Enlarged prostate without lower urinary tract symptoms: Secondary | ICD-10-CM | POA: Diagnosis not present

## 2017-10-10 DIAGNOSIS — Z79899 Other long term (current) drug therapy: Secondary | ICD-10-CM | POA: Diagnosis not present

## 2017-10-10 DIAGNOSIS — M159 Polyosteoarthritis, unspecified: Secondary | ICD-10-CM | POA: Diagnosis not present

## 2017-10-10 DIAGNOSIS — M25512 Pain in left shoulder: Secondary | ICD-10-CM | POA: Diagnosis not present

## 2017-10-10 DIAGNOSIS — Z23 Encounter for immunization: Secondary | ICD-10-CM | POA: Diagnosis not present

## 2017-10-10 DIAGNOSIS — N182 Chronic kidney disease, stage 2 (mild): Secondary | ICD-10-CM | POA: Diagnosis not present

## 2017-10-10 DIAGNOSIS — I359 Nonrheumatic aortic valve disorder, unspecified: Secondary | ICD-10-CM | POA: Diagnosis not present

## 2017-11-15 DIAGNOSIS — M5136 Other intervertebral disc degeneration, lumbar region: Secondary | ICD-10-CM | POA: Diagnosis not present

## 2017-11-15 DIAGNOSIS — M5416 Radiculopathy, lumbar region: Secondary | ICD-10-CM | POA: Diagnosis not present

## 2017-11-19 DIAGNOSIS — M5416 Radiculopathy, lumbar region: Secondary | ICD-10-CM | POA: Insufficient documentation

## 2017-12-02 DIAGNOSIS — M5416 Radiculopathy, lumbar region: Secondary | ICD-10-CM | POA: Diagnosis not present

## 2017-12-19 DIAGNOSIS — M5416 Radiculopathy, lumbar region: Secondary | ICD-10-CM | POA: Diagnosis not present

## 2017-12-26 DIAGNOSIS — N401 Enlarged prostate with lower urinary tract symptoms: Secondary | ICD-10-CM | POA: Diagnosis not present

## 2017-12-26 DIAGNOSIS — M5416 Radiculopathy, lumbar region: Secondary | ICD-10-CM | POA: Diagnosis not present

## 2018-01-03 DIAGNOSIS — M542 Cervicalgia: Secondary | ICD-10-CM | POA: Diagnosis not present

## 2018-01-11 DIAGNOSIS — M5416 Radiculopathy, lumbar region: Secondary | ICD-10-CM | POA: Diagnosis not present

## 2018-01-17 DIAGNOSIS — M5416 Radiculopathy, lumbar region: Secondary | ICD-10-CM | POA: Diagnosis not present

## 2018-01-31 DIAGNOSIS — M5416 Radiculopathy, lumbar region: Secondary | ICD-10-CM | POA: Diagnosis not present

## 2018-02-03 DIAGNOSIS — M5416 Radiculopathy, lumbar region: Secondary | ICD-10-CM | POA: Diagnosis not present
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
## 2018-02-07 ENCOUNTER — Encounter: Payer: Self-pay | Admitting: Interventional Cardiology

## 2018-02-07 ENCOUNTER — Ambulatory Visit (INDEPENDENT_AMBULATORY_CARE_PROVIDER_SITE_OTHER): Payer: Medicare Other | Admitting: Interventional Cardiology

## 2018-02-07 VITALS — BP 120/72 | HR 60 | Ht 70.0 in | Wt 193.8 lb

## 2018-02-07 DIAGNOSIS — R001 Bradycardia, unspecified: Secondary | ICD-10-CM

## 2018-02-07 DIAGNOSIS — I10 Essential (primary) hypertension: Secondary | ICD-10-CM

## 2018-02-07 DIAGNOSIS — I2510 Atherosclerotic heart disease of native coronary artery without angina pectoris: Secondary | ICD-10-CM | POA: Diagnosis not present

## 2018-02-07 DIAGNOSIS — I5032 Chronic diastolic (congestive) heart failure: Secondary | ICD-10-CM

## 2018-02-07 DIAGNOSIS — Z952 Presence of prosthetic heart valve: Secondary | ICD-10-CM

## 2018-02-07 MED ORDER — METOPROLOL SUCCINATE ER 25 MG PO TB24: 12.5000 mg | ORAL_TABLET | Freq: Every day | ORAL | 3 refills | Status: DC

## 2018-02-07 NOTE — Patient Instructions (Signed)
Medication Instructions:  1) DECREASE Metoprolol Succinate to 12.5mg  once daily.  Labwork: None  Testing/Procedures: None  Follow-Up: Your physician wants you to follow-up in: 1 year with [name].  You will receive a reminder letter in the mail two months in advance. If you don't receive a letter, please call our office to schedule the follow-up appointment.   Any Other Special Instructions Will Be Listed Below (If Applicable).     If you need a refill on your cardiac medications before your next appointment, please call your pharmacy.

## 2018-02-08 DIAGNOSIS — M5416 Radiculopathy, lumbar region: Secondary | ICD-10-CM | POA: Diagnosis not present

## 2018-02-15 DIAGNOSIS — M5416 Radiculopathy, lumbar region: Secondary | ICD-10-CM | POA: Diagnosis not present

## 2018-02-17 DIAGNOSIS — M5416 Radiculopathy, lumbar region: Secondary | ICD-10-CM | POA: Diagnosis not present

## 2018-02-22 DIAGNOSIS — M5416 Radiculopathy, lumbar region: Secondary | ICD-10-CM | POA: Diagnosis not present

## 2018-02-27 DIAGNOSIS — I2510 Atherosclerotic heart disease of native coronary artery without angina pectoris: Secondary | ICD-10-CM | POA: Diagnosis not present

## 2018-02-27 DIAGNOSIS — I119 Hypertensive heart disease without heart failure: Secondary | ICD-10-CM | POA: Diagnosis not present

## 2018-02-27 DIAGNOSIS — N401 Enlarged prostate with lower urinary tract symptoms: Secondary | ICD-10-CM | POA: Diagnosis not present

## 2018-02-27 DIAGNOSIS — M542 Cervicalgia: Secondary | ICD-10-CM | POA: Diagnosis not present

## 2018-02-27 DIAGNOSIS — N183 Chronic kidney disease, stage 3 (moderate): Secondary | ICD-10-CM | POA: Diagnosis not present

## 2018-02-27 DIAGNOSIS — E119 Type 2 diabetes mellitus without complications: Secondary | ICD-10-CM | POA: Diagnosis not present

## 2018-02-27 DIAGNOSIS — M159 Polyosteoarthritis, unspecified: Secondary | ICD-10-CM | POA: Diagnosis not present

## 2018-02-27 DIAGNOSIS — Z125 Encounter for screening for malignant neoplasm of prostate: Secondary | ICD-10-CM | POA: Diagnosis not present

## 2018-02-27 DIAGNOSIS — E7800 Pure hypercholesterolemia, unspecified: Secondary | ICD-10-CM | POA: Diagnosis not present

## 2018-02-27 DIAGNOSIS — I359 Nonrheumatic aortic valve disorder, unspecified: Secondary | ICD-10-CM | POA: Diagnosis not present

## 2018-02-27 DIAGNOSIS — Z79899 Other long term (current) drug therapy: Secondary | ICD-10-CM | POA: Diagnosis not present

## 2018-02-27 DIAGNOSIS — M545 Low back pain: Secondary | ICD-10-CM | POA: Diagnosis not present

## 2018-02-28 ENCOUNTER — Other Ambulatory Visit: Payer: Self-pay | Admitting: Interventional Cardiology

## 2018-03-08 ENCOUNTER — Other Ambulatory Visit: Payer: Self-pay | Admitting: Interventional Cardiology

## 2018-03-08 DIAGNOSIS — M5416 Radiculopathy, lumbar region: Secondary | ICD-10-CM | POA: Diagnosis not present

## 2018-03-08 MED ORDER — METOPROLOL SUCCINATE ER 25 MG PO TB24: 12.5000 mg | ORAL_TABLET | Freq: Every day | ORAL | 3 refills | Status: DC

## 2018-03-10 ENCOUNTER — Encounter

## 2018-03-28 DIAGNOSIS — N401 Enlarged prostate with lower urinary tract symptoms: Secondary | ICD-10-CM | POA: Diagnosis not present

## 2018-04-10 DIAGNOSIS — R351 Nocturia: Secondary | ICD-10-CM | POA: Diagnosis not present

## 2018-04-10 DIAGNOSIS — N401 Enlarged prostate with lower urinary tract symptoms: Secondary | ICD-10-CM | POA: Diagnosis not present

## 2018-04-10 DIAGNOSIS — R972 Elevated prostate specific antigen [PSA]: Secondary | ICD-10-CM | POA: Diagnosis not present

## 2018-05-23 DIAGNOSIS — I2510 Atherosclerotic heart disease of native coronary artery without angina pectoris: Secondary | ICD-10-CM | POA: Diagnosis not present

## 2018-05-23 DIAGNOSIS — N401 Enlarged prostate with lower urinary tract symptoms: Secondary | ICD-10-CM | POA: Diagnosis not present

## 2018-05-23 DIAGNOSIS — I350 Nonrheumatic aortic (valve) stenosis: Secondary | ICD-10-CM | POA: Diagnosis not present

## 2018-05-23 DIAGNOSIS — M25512 Pain in left shoulder: Secondary | ICD-10-CM | POA: Diagnosis not present

## 2018-05-23 DIAGNOSIS — Z79899 Other long term (current) drug therapy: Secondary | ICD-10-CM | POA: Diagnosis not present

## 2018-05-23 DIAGNOSIS — M159 Polyosteoarthritis, unspecified: Secondary | ICD-10-CM | POA: Diagnosis not present

## 2018-05-23 DIAGNOSIS — R531 Weakness: Secondary | ICD-10-CM | POA: Diagnosis not present

## 2018-05-23 DIAGNOSIS — J029 Acute pharyngitis, unspecified: Secondary | ICD-10-CM | POA: Diagnosis not present

## 2018-05-23 DIAGNOSIS — N183 Chronic kidney disease, stage 3 (moderate): Secondary | ICD-10-CM | POA: Diagnosis not present

## 2018-05-23 DIAGNOSIS — I119 Hypertensive heart disease without heart failure: Secondary | ICD-10-CM | POA: Diagnosis not present

## 2018-05-23 DIAGNOSIS — E7800 Pure hypercholesterolemia, unspecified: Secondary | ICD-10-CM | POA: Diagnosis not present

## 2018-05-23 DIAGNOSIS — E119 Type 2 diabetes mellitus without complications: Secondary | ICD-10-CM | POA: Diagnosis not present

## 2018-05-23 DIAGNOSIS — G933 Postviral fatigue syndrome: Secondary | ICD-10-CM | POA: Diagnosis not present

## 2018-05-28 DIAGNOSIS — J069 Acute upper respiratory infection, unspecified: Secondary | ICD-10-CM | POA: Diagnosis not present

## 2018-06-05 DIAGNOSIS — R972 Elevated prostate specific antigen [PSA]: Secondary | ICD-10-CM | POA: Diagnosis not present

## 2018-06-12 DIAGNOSIS — R3912 Poor urinary stream: Secondary | ICD-10-CM | POA: Diagnosis not present

## 2018-06-12 DIAGNOSIS — N401 Enlarged prostate with lower urinary tract symptoms: Secondary | ICD-10-CM | POA: Diagnosis not present

## 2018-06-12 DIAGNOSIS — R350 Frequency of micturition: Secondary | ICD-10-CM | POA: Diagnosis not present

## 2018-06-12 DIAGNOSIS — R972 Elevated prostate specific antigen [PSA]: Secondary | ICD-10-CM | POA: Diagnosis not present

## 2018-07-24 DIAGNOSIS — R972 Elevated prostate specific antigen [PSA]: Secondary | ICD-10-CM | POA: Diagnosis not present

## 2018-07-24 DIAGNOSIS — C61 Malignant neoplasm of prostate: Secondary | ICD-10-CM | POA: Diagnosis not present

## 2018-07-24 DIAGNOSIS — D075 Carcinoma in situ of prostate: Secondary | ICD-10-CM | POA: Diagnosis not present

## 2018-09-02 DIAGNOSIS — Z952 Presence of prosthetic heart valve: Secondary | ICD-10-CM

## 2018-09-02 HISTORY — DX: Presence of prosthetic heart valve: Z95.2

## 2018-09-05 DIAGNOSIS — R972 Elevated prostate specific antigen [PSA]: Secondary | ICD-10-CM | POA: Diagnosis not present

## 2018-09-12 DIAGNOSIS — C61 Malignant neoplasm of prostate: Secondary | ICD-10-CM | POA: Diagnosis not present

## 2018-09-14 ENCOUNTER — Telehealth: Payer: Self-pay | Admitting: Interventional Cardiology

## 2018-09-14 DIAGNOSIS — R002 Palpitations: Secondary | ICD-10-CM

## 2018-09-14 NOTE — Telephone Encounter (Signed)
If these episodes occur frequently, we should do a 48-hour Holter monitor.  If these episodes are less frequent he should do a 14-day monitor to rule out atrial fib

## 2018-09-14 NOTE — Telephone Encounter (Signed)
Spoke with pt and he mentioned that he is having a prostate surgery and the surgeon's office is going to send over a clearance form.  He wanted to be sure this was the correct thing to do.  Advised this is right and this is what we need to get him cleared for surgery.  Pt also wanted to mention that occasionally he has episodes where he feels like his heart is racing.  Pt compared it to the feeling you get when someone startles you.  Comes and goes.  Doesn't last any particular time frame, varies each time.  Denies SOB, CP, dizziness or other issues when it occurs.  Vitals this AM were 102/64, HR 71.  Wife states pt falls asleep every time he sits down and this is similar to how he was prior to having valve replaced.  Pt states he feels fine, just concerned about those fluttering feelings.  Advised I will send message to [name] for review.

## 2018-09-14 NOTE — Telephone Encounter (Signed)
Spoke with pt and went over recommendations per [name].  Pt states he may have them a day or two and then not have them for several days.  Advised we will order a 14 day monitor.  Pt aware that monitor will be mailed to home.

## 2018-09-14 NOTE — Telephone Encounter (Signed)
New Message   Patient is having an upcoming procedure and is okay with the effects that it may have on his heart. The procedure that he is having is for his prostate. Please call to discuss.

## 2018-09-15 ENCOUNTER — Ambulatory Visit
Admission: RE | Admit: 2018-09-15 | Discharge: 2018-09-15 | Disposition: A | Discharge status: Home / Self Care | Payer: Medicare Other | Source: Ambulatory Visit | Attending: Radiation Oncology | Admitting: Radiation Oncology

## 2018-09-15 ENCOUNTER — Telehealth: Payer: Self-pay | Admitting: Radiology

## 2018-09-15 ENCOUNTER — Other Ambulatory Visit: Payer: Self-pay

## 2018-09-15 ENCOUNTER — Encounter: Payer: Self-pay | Admitting: Radiation Oncology

## 2018-09-15 VITALS — Ht 70.0 in | Wt 195.0 lb

## 2018-09-15 DIAGNOSIS — C61 Malignant neoplasm of prostate: Secondary | ICD-10-CM

## 2018-09-15 DIAGNOSIS — R972 Elevated prostate specific antigen [PSA]: Secondary | ICD-10-CM | POA: Diagnosis not present

## 2018-09-15 HISTORY — DX: Malignant neoplasm of prostate: C61
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
## 2018-09-15 NOTE — Progress Notes (Signed)
GU Location of Tumor / Histology: Stage T1c adenocarcinoma of the prostate.  If Prostate Cancer, Gleason Score is ( 3+4) and PSA is (4.45)  Patient went to see urologist after experiencing erectile dysfunction.    Past/Anticipated interventions by urology, if any:   Weight changes, if any: Small changes related to diet.  Bowel/Bladder complaints, if any: No  Pain issues, if any:  No  SAFETY ISSUES:  Prior radiation? No  Pacemaker/ICD? No  Possible current pregnancy? N/A  Is the patient on methotrexate? No  Current Complaints / other details:

## 2018-09-15 NOTE — Telephone Encounter (Signed)
Enrolled patient for a 14 Day Live Zio monitor to be mailed due to covid-19. Patient it aware to expect the monitor to arrive in 3-4 days

## 2018-09-17 DIAGNOSIS — C61 Malignant neoplasm of prostate: Secondary | ICD-10-CM | POA: Insufficient documentation

## 2018-09-17 NOTE — Addendum Note (Signed)
Encounter addended by: Freeman Caldron, PA-C on: 09/17/2018 4:44 PM  Actions taken: Clinical Note Signed

## 2018-09-19 ENCOUNTER — Ambulatory Visit (INDEPENDENT_AMBULATORY_CARE_PROVIDER_SITE_OTHER): Payer: Medicare Other

## 2018-09-19 DIAGNOSIS — R002 Palpitations: Secondary | ICD-10-CM | POA: Diagnosis not present

## 2018-09-21 ENCOUNTER — Telehealth: Payer: Self-pay | Admitting: *Deleted

## 2018-09-21 ENCOUNTER — Telehealth: Payer: Self-pay | Admitting: Medical Oncology

## 2018-09-21 NOTE — Telephone Encounter (Signed)
RETURNED PATIENT'S PHONE CALL, SPOKE WITH PATIENT. ?

## 2018-09-21 NOTE — Telephone Encounter (Signed)
Left message to introduce myself as the prostate nurse navigator and my role. I asked him to return my call to discuss his treatment decision.

## 2018-09-22 ENCOUNTER — Telehealth: Payer: Self-pay | Admitting: Medical Oncology

## 2018-09-22 NOTE — Telephone Encounter (Addendum)
Spoke with [name] and I discussed my role as the prostate nurse navigator. I was unable to meet him 5/8 during his WebEx consult with Dr. [name]. He originally was leaning towards 5 1/2 weeks of radiation but has decided he would like to proceed with brachytherapy. I informed him that Enid Derry will be in contact with him to schedule the surgery. I asked him to call me with questions or concerns. He voiced understanding.

## 2018-09-26 DIAGNOSIS — R531 Weakness: Secondary | ICD-10-CM | POA: Diagnosis not present

## 2018-09-26 DIAGNOSIS — I119 Hypertensive heart disease without heart failure: Secondary | ICD-10-CM | POA: Diagnosis not present

## 2018-09-26 DIAGNOSIS — E1165 Type 2 diabetes mellitus with hyperglycemia: Secondary | ICD-10-CM | POA: Diagnosis not present

## 2018-09-26 DIAGNOSIS — Z79899 Other long term (current) drug therapy: Secondary | ICD-10-CM | POA: Diagnosis not present

## 2018-10-17 ENCOUNTER — Other Ambulatory Visit: Payer: Self-pay

## 2018-11-01 ENCOUNTER — Other Ambulatory Visit: Payer: Self-pay | Admitting: Urology

## 2018-11-01 ENCOUNTER — Telehealth: Payer: Self-pay | Admitting: *Deleted

## 2018-11-01 NOTE — Telephone Encounter (Signed)
Called patient to inform of pre-seed appts. and implant date, spoke with patient and he is aware of these appts. 

## 2018-11-07 DIAGNOSIS — H25013 Cortical age-related cataract, bilateral: Secondary | ICD-10-CM | POA: Diagnosis not present

## 2018-11-07 DIAGNOSIS — H2513 Age-related nuclear cataract, bilateral: Secondary | ICD-10-CM | POA: Diagnosis not present

## 2018-11-07 DIAGNOSIS — H35033 Hypertensive retinopathy, bilateral: Secondary | ICD-10-CM | POA: Diagnosis not present

## 2018-11-07 DIAGNOSIS — H04123 Dry eye syndrome of bilateral lacrimal glands: Secondary | ICD-10-CM | POA: Diagnosis not present

## 2018-11-13 ENCOUNTER — Ambulatory Visit: Payer: Medicare Other | Admitting: Radiation Oncology

## 2018-11-17 ENCOUNTER — Other Ambulatory Visit: Payer: Self-pay | Admitting: Urology

## 2018-11-24 ENCOUNTER — Other Ambulatory Visit: Payer: Self-pay | Admitting: Urology

## 2018-12-04 DIAGNOSIS — E7800 Pure hypercholesterolemia, unspecified: Secondary | ICD-10-CM | POA: Diagnosis not present

## 2018-12-04 DIAGNOSIS — M25512 Pain in left shoulder: Secondary | ICD-10-CM | POA: Diagnosis not present

## 2018-12-04 DIAGNOSIS — M1990 Unspecified osteoarthritis, unspecified site: Secondary | ICD-10-CM | POA: Diagnosis not present

## 2018-12-04 DIAGNOSIS — I350 Nonrheumatic aortic (valve) stenosis: Secondary | ICD-10-CM | POA: Diagnosis not present

## 2018-12-04 DIAGNOSIS — Z79899 Other long term (current) drug therapy: Secondary | ICD-10-CM | POA: Diagnosis not present

## 2018-12-04 DIAGNOSIS — N401 Enlarged prostate with lower urinary tract symptoms: Secondary | ICD-10-CM | POA: Diagnosis not present

## 2018-12-04 DIAGNOSIS — E119 Type 2 diabetes mellitus without complications: Secondary | ICD-10-CM | POA: Diagnosis not present

## 2018-12-04 DIAGNOSIS — I119 Hypertensive heart disease without heart failure: Secondary | ICD-10-CM | POA: Diagnosis not present

## 2018-12-04 DIAGNOSIS — N183 Chronic kidney disease, stage 3 (moderate): Secondary | ICD-10-CM | POA: Diagnosis not present

## 2018-12-04 DIAGNOSIS — C61 Malignant neoplasm of prostate: Secondary | ICD-10-CM | POA: Diagnosis not present

## 2018-12-04 DIAGNOSIS — I2510 Atherosclerotic heart disease of native coronary artery without angina pectoris: Secondary | ICD-10-CM | POA: Diagnosis not present

## 2018-12-04 DIAGNOSIS — Z139 Encounter for screening, unspecified: Secondary | ICD-10-CM | POA: Diagnosis not present

## 2018-12-05 ENCOUNTER — Other Ambulatory Visit: Payer: Self-pay | Admitting: Urology

## 2018-12-05 DIAGNOSIS — C61 Malignant neoplasm of prostate: Secondary | ICD-10-CM

## 2018-12-06 ENCOUNTER — Telehealth: Payer: Self-pay | Admitting: *Deleted

## 2018-12-06 NOTE — Telephone Encounter (Signed)
CALLED PATIENT TO REMIND OF PRE-SEED APPTS. FOR 12-14-18, SPOKE WITH PATIENT AND HE IS AWARE OF THESE APPTS.

## 2018-12-13 ENCOUNTER — Telehealth: Payer: Self-pay | Admitting: *Deleted

## 2018-12-13 NOTE — Telephone Encounter (Signed)
CALLED PATIENT TO REMND OF PRE-SEED APPTS. AND MRI FOR 12-14-18, SPOKE WITH PATIENT AND HE IS AWARE OF THESE APPTS.

## 2018-12-13 NOTE — Progress Notes (Signed)
  Radiation Oncology         [phone] ________________________________  Name: [name] MRN: [id number]  Date: 12/14/2018  DOB: 1940/07/08  SIMULATION AND TREATMENT PLANNING NOTE PUBIC ARCH STUDY  [id number], [name], MD  [name], MD  DIAGNOSIS: 78 y.o. gentleman with Stage T1c adenocarcinoma of the prostate with Gleason score of 3+4, and PSA of 4.45     ICD-10-CM   1. Malignant neoplasm of prostate ([place])  C61     COMPLEX SIMULATION:  The patient presented today for evaluation for possible prostate seed implant. He was brought to the radiation planning suite and placed supine on the CT couch. A 3-dimensional image study set was obtained in upload to the planning computer. There, on each axial slice, I contoured the prostate gland. Then, using three-dimensional radiation planning tools I reconstructed the prostate in view of the structures from the transperineal needle pathway to assess for possible pubic arch interference. In doing so, I did not appreciate any pubic arch interference. Also, the patient's prostate volume was estimated based on the drawn structure. The volume was 39 cc.  Given the pubic arch appearance and prostate volume, patient remains a good candidate to proceed with prostate seed implant. Today, he freely provided informed written consent to proceed.    PLAN: The patient will undergo prostate seed implant.   ________________________________  Sheral Apley. [name], M.D.

## 2018-12-14 ENCOUNTER — Ambulatory Visit
Admission: RE | Admit: 2018-12-14 | Discharge: 2018-12-14 | Disposition: A | Discharge status: Home / Self Care | Payer: Medicare Other | Source: Ambulatory Visit | Attending: Radiation Oncology | Admitting: Radiation Oncology

## 2018-12-14 ENCOUNTER — Encounter (HOSPITAL_COMMUNITY)
Admission: RE | Admit: 2018-12-14 | Discharge: 2018-12-14 | Disposition: A | Discharge status: Home / Self Care | Payer: Medicare Other | Source: Ambulatory Visit | Attending: Urology | Admitting: Urology

## 2018-12-14 ENCOUNTER — Ambulatory Visit
Admission: RE | Admit: 2018-12-14 | Discharge: 2018-12-14 | Disposition: A | Discharge status: Home / Self Care | Payer: Medicare Other | Source: Ambulatory Visit | Attending: Urology | Admitting: Urology

## 2018-12-14 ENCOUNTER — Ambulatory Visit (HOSPITAL_COMMUNITY)
Admission: RE | Admit: 2018-12-14 | Discharge: 2018-12-14 | Disposition: A | Discharge status: Home / Self Care | Payer: Medicare Other | Source: Ambulatory Visit | Attending: Urology | Admitting: Urology

## 2018-12-14 ENCOUNTER — Other Ambulatory Visit: Payer: Self-pay

## 2018-12-14 DIAGNOSIS — C61 Malignant neoplasm of prostate: Secondary | ICD-10-CM | POA: Insufficient documentation

## 2019-01-05 ENCOUNTER — Other Ambulatory Visit: Payer: Self-pay

## 2019-01-05 ENCOUNTER — Other Ambulatory Visit (HOSPITAL_COMMUNITY)
Admission: RE | Admit: 2019-01-05 | Discharge: 2019-01-05 | Disposition: A | Discharge status: Home / Self Care | Payer: Medicare Other | Source: Ambulatory Visit | Attending: Urology | Admitting: Urology

## 2019-01-05 ENCOUNTER — Encounter (HOSPITAL_COMMUNITY)
Admission: RE | Admit: 2019-01-05 | Discharge: 2019-01-05 | Disposition: A | Discharge status: Home / Self Care | Payer: Medicare Other | Source: Ambulatory Visit | Attending: Urology | Admitting: Urology

## 2019-01-05 DIAGNOSIS — Z951 Presence of aortocoronary bypass graft: Secondary | ICD-10-CM | POA: Diagnosis not present

## 2019-01-05 DIAGNOSIS — R351 Nocturia: Secondary | ICD-10-CM | POA: Insufficient documentation

## 2019-01-05 DIAGNOSIS — I130 Hypertensive heart and chronic kidney disease with heart failure and stage 1 through stage 4 chronic kidney disease, or unspecified chronic kidney disease: Secondary | ICD-10-CM | POA: Insufficient documentation

## 2019-01-05 DIAGNOSIS — Z7982 Long term (current) use of aspirin: Secondary | ICD-10-CM | POA: Diagnosis not present

## 2019-01-05 DIAGNOSIS — Z20828 Contact with and (suspected) exposure to other viral communicable diseases: Secondary | ICD-10-CM | POA: Diagnosis not present

## 2019-01-05 DIAGNOSIS — Z86711 Personal history of pulmonary embolism: Secondary | ICD-10-CM | POA: Diagnosis not present

## 2019-01-05 DIAGNOSIS — N183 Chronic kidney disease, stage 3 (moderate): Secondary | ICD-10-CM | POA: Diagnosis not present

## 2019-01-05 DIAGNOSIS — I5032 Chronic diastolic (congestive) heart failure: Secondary | ICD-10-CM | POA: Diagnosis not present

## 2019-01-05 DIAGNOSIS — Z79899 Other long term (current) drug therapy: Secondary | ICD-10-CM | POA: Insufficient documentation

## 2019-01-05 DIAGNOSIS — R7303 Prediabetes: Secondary | ICD-10-CM | POA: Insufficient documentation

## 2019-01-05 DIAGNOSIS — Z01812 Encounter for preprocedural laboratory examination: Secondary | ICD-10-CM | POA: Insufficient documentation

## 2019-01-05 DIAGNOSIS — E785 Hyperlipidemia, unspecified: Secondary | ICD-10-CM | POA: Insufficient documentation

## 2019-01-05 LAB — COMPREHENSIVE METABOLIC PANEL
ALT: 18 U/L (ref 0–44)
AST: 25 U/L (ref 15–41)
Albumin: 4.1 g/dL (ref 3.5–5.0)
Alkaline Phosphatase: 54 U/L (ref 38–126)
Anion gap: 9 (ref 5–15)
BUN: 21 mg/dL (ref 8–23)
CO2: 26 mmol/L (ref 22–32)
Calcium: 9.3 mg/dL (ref 8.9–10.3)
Chloride: 103 mmol/L (ref 98–111)
Creatinine, Ser: 1.37 mg/dL — ABNORMAL HIGH (ref 0.61–1.24)
GFR calc Af Amer: 56 mL/min — ABNORMAL LOW (ref >60)
GFR calc non Af Amer: 49 mL/min — ABNORMAL LOW (ref >60)
Glucose, Bld: 99 mg/dL (ref 70–99)
Potassium: 3.7 mmol/L (ref 3.5–5.1)
Sodium: 138 mmol/L (ref 135–145)
Total Bilirubin: 0.7 mg/dL (ref 0.3–1.2)
Total Protein: 7.3 g/dL (ref 6.5–8.1)

## 2019-01-05 LAB — CBC
HCT: 42.5 % (ref 39.0–52.0)
Hemoglobin: 13.7 g/dL (ref 13.0–17.0)
MCH: 29.1 pg (ref 26.0–34.0)
MCHC: 32.2 g/dL (ref 30.0–36.0)
MCV: 90.4 fL (ref 80.0–100.0)
Platelets: 158 10*3/uL (ref 150–400)
RBC: 4.7 MIL/uL (ref 4.22–5.81)
RDW: 14 % (ref 11.5–15.5)
WBC: 3.5 10*3/uL — ABNORMAL LOW (ref 4.0–10.5)
nRBC: 0 % (ref 0.0–0.2)

## 2019-01-05 LAB — SARS CORONAVIRUS 2 (TAT 6-24 HRS): SARS Coronavirus 2: NEGATIVE

## 2019-01-05 LAB — APTT: aPTT: 33 seconds (ref 24–36)

## 2019-01-05 LAB — PROTIME-INR
INR: 1.2 (ref 0.8–1.2)
Prothrombin Time: 14.9 seconds (ref 11.4–15.2)

## 2019-01-08 ENCOUNTER — Encounter: Payer: Self-pay | Admitting: *Deleted

## 2019-01-08 ENCOUNTER — Other Ambulatory Visit: Payer: Self-pay

## 2019-01-08 ENCOUNTER — Telehealth: Payer: Self-pay | Admitting: *Deleted

## 2019-01-08 ENCOUNTER — Encounter (HOSPITAL_BASED_OUTPATIENT_CLINIC_OR_DEPARTMENT_OTHER): Payer: Self-pay | Admitting: *Deleted

## 2019-01-08 NOTE — Anesthesia Preprocedure Evaluation (Addendum)
Anesthesia Evaluation  Patient identified by MRN, date of birth, ID band Patient awake    Reviewed: Allergy & Precautions, NPO status , Patient's Chart, lab work & pertinent test results  Airway Mallampati: II  TM Distance: >3 FB Neck ROM: Full    Dental no notable dental hx. (+) Dental Advisory Given, Upper Dentures, Partial Lower   Pulmonary neg pulmonary ROS,    Pulmonary exam normal breath sounds clear to auscultation       Cardiovascular hypertension, + CAD, + CABG (2013) and +CHF  Normal cardiovascular exam+ Valvular Problems/Murmurs (s/p AVR 2013) AS  Rhythm:Regular Rate:Normal  12/14/2018 Rate 60 bpm Sinus rhythm with 1st degree A-V block with Premature atrial complexes in a pattern of bigeminy Otherwise normal ECG No significant change since last tracing  CV: Echo 06/09/2016 Study Conclusions  - Left ventricle: The cavity size was normal. Wall thickness wasincreased in a pattern of moderate LVH. Systolic function was normal. The estimated ejection fraction was in the range of 55%to 60%. Wall motion was normal; there were no regional wallmotion abnormalities. - Aortic valve: Normal appearing tissue AVR with trivial central AR - Mitral valve: Mild prolapse of anterior leaflet with mild eccentric posteriorly directed MR. - Left atrium: The atrium was mildly dilated.  Myocardial Perfusion 12/11/2015 Nuclear stress EF: 63%. There was no ST segment deviation noted during stress. The study is normal. This is a low risk study. The left ventricular ejection fraction is normal (55-65%).  Normal resting and stress perfusion. No ischemia or infarction EF 63%   Neuro/Psych PSYCHIATRIC DISORDERS Anxiety negative neurological ROS     GI/Hepatic negative GI ROS, Neg liver ROS,   Endo/Other  negative endocrine ROS  Renal/GU Renal InsufficiencyRenal disease  negative genitourinary   Musculoskeletal  (+) Arthritis  ,   Abdominal   Peds  Hematology negative hematology ROS (+)   Anesthesia Other Findings   Reproductive/Obstetrics                          Anesthesia Physical Anesthesia Plan  ASA: III  Anesthesia Plan: General   Post-op Pain Management:    Induction: Intravenous  PONV Risk Score and Plan: Ondansetron and Dexamethasone  Airway Management Planned: LMA  Additional Equipment:   Intra-op Plan:   Post-operative Plan: Extubation in OR  Informed Consent: I have reviewed the patients History and Physical, chart, labs and discussed the procedure including the risks, benefits and alternatives for the proposed anesthesia with the patient or authorized representative who has indicated his/her understanding and acceptance.     Dental advisory given  Plan Discussed with: CRNA  Anesthesia Plan Comments:       Anesthesia Quick Evaluation

## 2019-01-08 NOTE — Telephone Encounter (Signed)
CALLED PATIENT TO REMIND OF PROCEDURE FOR 01-09-19, LVM FOR A RETURN CALL

## 2019-01-08 NOTE — Progress Notes (Addendum)
Spoke w/ pt via phone for pre-op interview.  Npo after mn.  Arrive at 1015.  Current lab results and ccovid test done 01-05-2019 in epic and chart.  Current ekg and cxr in chart and epic.  Arrive at 1015.  Will take vesicare am dos w/ sips of water and one fleet enema.   PCP -  dr Elby Showers Cardiologist -  dr h.smith (hx chf, avr, cabg) lov 02-07-2018  Chest x-ray -  12-14-2018 epic EKG -  12-14-2018  epic Stress Test -   12-11-2015 epic (low risk, nuclear ef 63%) ECHO -  06-09-2018 epic Cardiac Cath -  08-19-2011 in epic  Sleep Study - n/a CPAP -   Fasting Blood Sugar -  (pre-diabetes , per pt does not check) Checks Blood Sugar _____ times a day  Blood Thinner Instructions: n/a Aspirin Instructions:  ASA 81 mg Last Dose:  Per pt given instructions to stop 1 week prior to surgery from dr [employer] office (cancer center).  Last dose 12-31-2018  Anesthesia review:   Pt denies cardiac s&s.  Since cardiologist visit 02-07-2018 pt had episode palpitations, had even monitor , results 10-17-2018 in epic.  Patient denies shortness of breath, fever, cough and chest pain at phone interview.  ADDENDUM:  Per anesthesia, Konrad Felix PA, ok to proceed.

## 2019-01-08 NOTE — Progress Notes (Signed)
Anesthesia Chart Review   Case: [id number] Date/Time: 01/09/19 1200   Procedure: RADIOACTIVE SEED IMPLANT/BRACHYTHERAPY IMPLANT (N/A )   Anesthesia type: General   Pre-op diagnosis: PROSTATE CANCER   Location: [facility] OR ROOM 1 / [place]   Surgeon: [name], MD      DISCUSSION:78 y.o. never smoker with h/o HLD, HTN, pre-diabetes, S/p bioprosthetic AV replacement 09/02/2011, bilateral PE post op, diastolic CHF, CKD Stage III, prostate cancer scheduled for above procedure 01/09/2019 with Dr. [name].   Pt last seen by cardiologist, Dr. Daneen Schick, 02/07/2018.  Per OV note stable at this visit with echo planned at 1 year follow up.  Pt complained of palpitations recently and was given 14 day Zio monitor.  Per Dr. [name] no significant abnormality on monitor.   Anticipate pt can proceed with planned procedure barring acute status change.   VS: There were no vitals taken for this visit.  PROVIDERS: [name], MD is PCP   Daneen Schick, MD is Cardiologist  LABS: Labs reviewed: Acceptable for surgery. (all labs ordered are listed, but only abnormal results are displayed)  Labs Reviewed  CBC - Abnormal; Notable for the following components:      Result Value   WBC 3.5 (*)    All other components within normal limits  COMPREHENSIVE METABOLIC PANEL - Abnormal; Notable for the following components:   Creatinine, Ser 1.37 (*)    GFR calc non Af Amer 49 (*)    GFR calc Af Amer 56 (*)    All other components within normal limits  APTT  PROTIME-INR     IMAGES: Chest Xray 12/14/2018 FINDINGS: Lungs clear. Heart size normal. The patient is status post aortic valve replacement. No pneumothorax or pleural fluid. No acute or focal bony abnormality.  IMPRESSION: No acute disease.  EKG: 12/14/2018 Rate 60 bpm Sinus rhythm with 1st degree A-V block with Premature atrial complexes in a pattern of bigeminy Otherwise normal ECG No significant  change since last tracing  CV: Echo 06/09/2016 Study Conclusions  - Left ventricle: The cavity size was normal. Wall thickness was   increased in a pattern of moderate LVH. Systolic function was   normal. The estimated ejection fraction was in the range of 55%   to 60%. Wall motion was normal; there were no regional wall   motion abnormalities. - Aortic valve: Normal appearing tissue AVR with trivial central AR - Mitral valve: Mild prolapse of anterior leaflet with mild   eccentric posteriorly directed MR. - Left atrium: The atrium was mildly dilated.  Myocardial Perfusion 12/11/2015  Nuclear stress EF: 63%.  There was no ST segment deviation noted during stress.  The study is normal.  This is a low risk study.  The left ventricular ejection fraction is normal (55-65%).   Normal resting and stress perfusion. No ischemia or infarction EF 63% Past Medical History:  Diagnosis Date  . Anxiety   . Arthritis    shoulders , back  . Back pain    buldging disc  . CKD (chronic kidney disease), stage III ([place])   . Coronary artery disease cardiologist-- [name]   a. s/p SVG-LCx at time of AVR on 09-02-2011.  . Diastolic CHF, chronic ([place])    followed by [name]  . First degree heart block   . Hemorrhoids   . History of palpitations    per pt 05/ 2020 notified his cardiologsit, [name], had an episode of palpitations;  [name] ordered Zio patch montior results 10-17-2018, per [name] note no signifiant abnormality (01-08-2019 pt stated has not had any palpitations since that one time)  . History of pulmonary embolus (PE)    09-11-2011 post op surgery CABG/ AVR on 09-02-2011----bilateral PE and right pleural effusionper pt completed coumadin  . Hyperlipidemia    takes Trilipix daily  . Hypertension   . Nocturia   . Pre-diabetes   . Prostate cancer [facility]) urologist-- dr eskridge/  dr [name]   dx 07-24-2018,  Stage T1c, Gleason 3+4  . S/P aortic valve  replacement   . S/P aortic valve replacement with prosthetic valve 09/02/2018   bioprosthetic  . Wears dentures    full upper and partial lower  . Wears glasses     Past Surgical History:  Procedure Laterality Date  . AORTIC VALVE REPLACEMENT (AVR)/CORONARY ARTERY BYPASS GRAFTING (CABG)  09-02-2011   dr [name]   SVG to LCx and Bioprosthetic aortic valve  . BLEPHAROPLASTY Right 2009 approx.  [name] CARDIAC CATHETERIZATION  08-19-2018   [name]   severe single vessel LCx and severe aortic valve stenosis, normal LVF  . COLONOSCOPY     2007/2012  . CYST REMOVAL NECK  1970s   benign per pt  . ESOPHAGOGASTRODUODENOSCOPY      MEDICATIONS: . amoxicillin (AMOXIL) 500 MG capsule  . aspirin EC 81 MG tablet  . Azilsartan Medoxomil (EDARBI) 80 MG TABS  . chlorthalidone (HYGROTON) 25 MG tablet  . Choline Fenofibrate (FENOFIBRIC ACID) 45 MG CPDR  . meclizine (ANTIVERT) 12.5 MG tablet  . Multiple Vitamin (MULTIVITAMIN) tablet  . Omega-3 Fatty Acids (OMEGA-3 FISH OIL PO)  . solifenacin (VESICARE) 5 MG tablet  . Tamsulosin HCl (FLOMAX) 0.4 MG CAPS  . XIIDRA 5 % SOLN   No current facility-administered medications for this encounter.     Maia Plan [facility] Pre-Surgical Testing [phone] 01/08/19 1:00 PM

## 2019-01-09 ENCOUNTER — Other Ambulatory Visit (HOSPITAL_COMMUNITY): Payer: Self-pay | Admitting: *Deleted

## 2019-01-09 ENCOUNTER — Ambulatory Visit (HOSPITAL_BASED_OUTPATIENT_CLINIC_OR_DEPARTMENT_OTHER): Payer: Medicare Other | Admitting: Certified Registered Nurse Anesthetist

## 2019-01-09 ENCOUNTER — Ambulatory Visit (HOSPITAL_BASED_OUTPATIENT_CLINIC_OR_DEPARTMENT_OTHER): Payer: Medicare Other | Admitting: Physician Assistant

## 2019-01-09 ENCOUNTER — Ambulatory Visit (HOSPITAL_COMMUNITY): Payer: Medicare Other

## 2019-01-09 ENCOUNTER — Ambulatory Visit (HOSPITAL_BASED_OUTPATIENT_CLINIC_OR_DEPARTMENT_OTHER)
Admission: RE | Admit: 2019-01-09 | Discharge: 2019-01-09 | Disposition: A | Discharge status: Home / Self Care | Payer: Medicare Other | Attending: Urology | Admitting: Urology

## 2019-01-09 ENCOUNTER — Other Ambulatory Visit: Payer: Self-pay

## 2019-01-09 ENCOUNTER — Encounter (HOSPITAL_BASED_OUTPATIENT_CLINIC_OR_DEPARTMENT_OTHER)
Admission: RE | Disposition: A | Discharge status: Home / Self Care | Payer: Self-pay | Source: Home / Self Care | Attending: Urology

## 2019-01-09 ENCOUNTER — Encounter (HOSPITAL_BASED_OUTPATIENT_CLINIC_OR_DEPARTMENT_OTHER): Payer: Self-pay | Admitting: Emergency Medicine

## 2019-01-09 ENCOUNTER — Other Ambulatory Visit (HOSPITAL_COMMUNITY): Payer: Medicare Other

## 2019-01-09 DIAGNOSIS — I5032 Chronic diastolic (congestive) heart failure: Secondary | ICD-10-CM | POA: Diagnosis not present

## 2019-01-09 DIAGNOSIS — C61 Malignant neoplasm of prostate: Secondary | ICD-10-CM | POA: Diagnosis not present

## 2019-01-09 DIAGNOSIS — N183 Chronic kidney disease, stage 3 (moderate): Secondary | ICD-10-CM | POA: Insufficient documentation

## 2019-01-09 DIAGNOSIS — Z885 Allergy status to narcotic agent status: Secondary | ICD-10-CM | POA: Diagnosis not present

## 2019-01-09 DIAGNOSIS — Z20828 Contact with and (suspected) exposure to other viral communicable diseases: Secondary | ICD-10-CM | POA: Insufficient documentation

## 2019-01-09 DIAGNOSIS — I130 Hypertensive heart and chronic kidney disease with heart failure and stage 1 through stage 4 chronic kidney disease, or unspecified chronic kidney disease: Secondary | ICD-10-CM | POA: Insufficient documentation

## 2019-01-09 DIAGNOSIS — Z86711 Personal history of pulmonary embolism: Secondary | ICD-10-CM | POA: Diagnosis not present

## 2019-01-09 DIAGNOSIS — Z951 Presence of aortocoronary bypass graft: Secondary | ICD-10-CM | POA: Diagnosis not present

## 2019-01-09 DIAGNOSIS — M19019 Primary osteoarthritis, unspecified shoulder: Secondary | ICD-10-CM | POA: Diagnosis not present

## 2019-01-09 DIAGNOSIS — F419 Anxiety disorder, unspecified: Secondary | ICD-10-CM | POA: Diagnosis not present

## 2019-01-09 DIAGNOSIS — M4690 Unspecified inflammatory spondylopathy, site unspecified: Secondary | ICD-10-CM | POA: Insufficient documentation

## 2019-01-09 DIAGNOSIS — Z79899 Other long term (current) drug therapy: Secondary | ICD-10-CM | POA: Diagnosis not present

## 2019-01-09 DIAGNOSIS — I2510 Atherosclerotic heart disease of native coronary artery without angina pectoris: Secondary | ICD-10-CM | POA: Insufficient documentation

## 2019-01-09 DIAGNOSIS — Z7982 Long term (current) use of aspirin: Secondary | ICD-10-CM | POA: Diagnosis not present

## 2019-01-09 DIAGNOSIS — Z8249 Family history of ischemic heart disease and other diseases of the circulatory system: Secondary | ICD-10-CM | POA: Diagnosis not present

## 2019-01-09 DIAGNOSIS — Z952 Presence of prosthetic heart valve: Secondary | ICD-10-CM | POA: Diagnosis not present

## 2019-01-09 DIAGNOSIS — E785 Hyperlipidemia, unspecified: Secondary | ICD-10-CM | POA: Diagnosis not present

## 2019-01-09 DIAGNOSIS — N35919 Unspecified urethral stricture, male, unspecified site: Secondary | ICD-10-CM | POA: Insufficient documentation

## 2019-01-09 DIAGNOSIS — I110 Hypertensive heart disease with heart failure: Secondary | ICD-10-CM | POA: Diagnosis not present

## 2019-01-09 HISTORY — DX: Personal history of other specified conditions: Z87.898

## 2019-01-09 HISTORY — DX: Presence of prosthetic heart valve: Z95.2

## 2019-01-09 HISTORY — DX: Presence of spectacles and contact lenses: Z97.3

## 2019-01-09 HISTORY — DX: Atrioventricular block, first degree: I44.0

## 2019-01-09 HISTORY — DX: Unspecified osteoarthritis, unspecified site: M19.90

## 2019-01-09 HISTORY — DX: Chronic diastolic (congestive) heart failure: I50.32

## 2019-01-09 HISTORY — DX: Presence of dental prosthetic device (complete) (partial): Z97.2

## 2019-01-09 HISTORY — DX: Personal history of pulmonary embolism: Z86.711

## 2019-01-09 HISTORY — DX: Chronic kidney disease, stage 3 unspecified: N18.30

## 2019-01-09 HISTORY — PX: RADIOACTIVE SEED IMPLANT: SHX5150

## 2019-01-09 HISTORY — DX: Prediabetes: R73.03

## 2019-01-09 LAB — SARS CORONAVIRUS 2 BY RT PCR (HOSPITAL ORDER, PERFORMED IN ~~LOC~~ HOSPITAL LAB): SARS Coronavirus 2: NEGATIVE

## 2019-01-09 SURGERY — INSERTION, RADIATION SOURCE, PROSTATE
Anesthesia: General | Site: Prostate

## 2019-01-09 MED ORDER — PROPOFOL 10 MG/ML IV BOLUS
INTRAVENOUS | Status: AC
  Filled 2019-01-09: qty 20

## 2019-01-09 MED ORDER — EPHEDRINE 5 MG/ML INJ
INTRAVENOUS | Status: AC
  Filled 2019-01-09: qty 10

## 2019-01-09 MED ORDER — SODIUM CHLORIDE 0.9 % IR SOLN
Status: DC | PRN
  Administered 2019-01-09: 1000 mL via INTRAVESICAL

## 2019-01-09 MED ORDER — CIPROFLOXACIN IN D5W 400 MG/200ML IV SOLN
400.0000 mg | INTRAVENOUS | Status: AC
  Administered 2019-01-09: 400 mg via INTRAVENOUS
  Filled 2019-01-09: qty 200

## 2019-01-09 MED ORDER — ONDANSETRON HCL 4 MG/2ML IJ SOLN
INTRAMUSCULAR | Status: DC | PRN
  Administered 2019-01-09: 4 mg via INTRAVENOUS

## 2019-01-09 MED ORDER — ONDANSETRON HCL 4 MG/2ML IJ SOLN
INTRAMUSCULAR | Status: AC
  Filled 2019-01-09: qty 2

## 2019-01-09 MED ORDER — LACTATED RINGERS IV SOLN
INTRAVENOUS | Status: DC | PRN
  Administered 2019-01-09 (×2): via INTRAVENOUS

## 2019-01-09 MED ORDER — FLEET ENEMA 7-19 GM/118ML RE ENEM
1.0000 | ENEMA | Freq: Once | RECTAL | Status: DC
  Filled 2019-01-09: qty 1

## 2019-01-09 MED ORDER — DEXAMETHASONE SODIUM PHOSPHATE 10 MG/ML IJ SOLN
INTRAMUSCULAR | Status: AC
  Filled 2019-01-09: qty 1

## 2019-01-09 MED ORDER — KETOROLAC TROMETHAMINE 30 MG/ML IJ SOLN
INTRAMUSCULAR | Status: DC | PRN
  Administered 2019-01-09: 30 mg via INTRAVENOUS

## 2019-01-09 MED ORDER — MIDAZOLAM HCL 2 MG/2ML IJ SOLN
INTRAMUSCULAR | Status: AC
  Filled 2019-01-09: qty 2

## 2019-01-09 MED ORDER — LIDOCAINE 2% (20 MG/ML) 5 ML SYRINGE
INTRAMUSCULAR | Status: AC
  Filled 2019-01-09: qty 5

## 2019-01-09 MED ORDER — EPHEDRINE SULFATE 50 MG/ML IJ SOLN
INTRAMUSCULAR | Status: DC | PRN
  Administered 2019-01-09: 10 mg via INTRAVENOUS
  Administered 2019-01-09 (×2): 15 mg via INTRAVENOUS

## 2019-01-09 MED ORDER — KETOROLAC TROMETHAMINE 30 MG/ML IJ SOLN
INTRAMUSCULAR | Status: AC
  Filled 2019-01-09: qty 1

## 2019-01-09 MED ORDER — IOHEXOL 300 MG/ML  SOLN
INTRAMUSCULAR | Status: DC | PRN
  Administered 2019-01-09: 16:00:00 7 mL

## 2019-01-09 MED ORDER — FENTANYL CITRATE (PF) 100 MCG/2ML IJ SOLN
25.0000 ug | INTRAMUSCULAR | Status: DC | PRN
  Filled 2019-01-09: qty 1

## 2019-01-09 MED ORDER — CIPROFLOXACIN IN D5W 400 MG/200ML IV SOLN
INTRAVENOUS | Status: AC
  Filled 2019-01-09: qty 200

## 2019-01-09 MED ORDER — CEPHALEXIN 500 MG PO CAPS: 500.0000 mg | ORAL_CAPSULE | Freq: Two times a day (BID) | ORAL | 0 refills | Status: DC

## 2019-01-09 MED ORDER — FENTANYL CITRATE (PF) 100 MCG/2ML IJ SOLN
INTRAMUSCULAR | Status: AC
  Filled 2019-01-09: qty 2

## 2019-01-09 MED ORDER — FENTANYL CITRATE (PF) 100 MCG/2ML IJ SOLN
INTRAMUSCULAR | Status: DC | PRN
  Administered 2019-01-09 (×4): 25 ug via INTRAVENOUS

## 2019-01-09 MED ORDER — ACETAMINOPHEN 500 MG PO TABS
ORAL_TABLET | ORAL | Status: AC
  Filled 2019-01-09: qty 2

## 2019-01-09 MED ORDER — LIDOCAINE HCL (CARDIAC) PF 100 MG/5ML IV SOSY
PREFILLED_SYRINGE | INTRAVENOUS | Status: DC | PRN
  Administered 2019-01-09: 70 mg via INTRAVENOUS

## 2019-01-09 MED ORDER — PROPOFOL 10 MG/ML IV BOLUS
INTRAVENOUS | Status: DC | PRN
  Administered 2019-01-09: 100 mg via INTRAVENOUS
  Administered 2019-01-09 (×2): 20 mg via INTRAVENOUS

## 2019-01-09 MED ORDER — HYDROCODONE-ACETAMINOPHEN 5-325 MG PO TABS: 1.0000 | ORAL_TABLET | Freq: Four times a day (QID) | ORAL | 0 refills | Status: DC | PRN

## 2019-01-09 SURGICAL SUPPLY — 34 items
BAG URINE DRAINAGE (UROLOGICAL SUPPLIES) ×2 IMPLANT
BLADE CLIPPER SENSICLIP SURGIC (BLADE) ×2 IMPLANT
CATH FOLEY 2WAY SLVR  5CC 16FR (CATHETERS) ×1
CATH FOLEY 2WAY SLVR 5CC 16FR (CATHETERS) ×1 IMPLANT
CATH ROBINSON RED A/P 16FR (CATHETERS) IMPLANT
CATH ROBINSON RED A/P 20FR (CATHETERS) ×2 IMPLANT
CLOTH BEACON ORANGE TIMEOUT ST (SAFETY) ×2 IMPLANT
CONT SPECI 4OZ STER CLIK (MISCELLANEOUS) ×4 IMPLANT
COVER BACK TABLE 60X90IN (DRAPES) ×2 IMPLANT
COVER MAYO STAND STRL (DRAPES) ×2 IMPLANT
COVER WAND RF STERILE (DRAPES) ×2 IMPLANT
DRSG TEGADERM 4X4.75 (GAUZE/BANDAGES/DRESSINGS) ×2 IMPLANT
DRSG TEGADERM 8X12 (GAUZE/BANDAGES/DRESSINGS) ×2 IMPLANT
GAUZE SPONGE 4X4 12PLY STRL (GAUZE/BANDAGES/DRESSINGS) ×2 IMPLANT
GLOVE BIO SURGEON STRL SZ 6 (GLOVE) IMPLANT
GLOVE BIO SURGEON STRL SZ 6.5 (GLOVE) IMPLANT
GLOVE BIO SURGEON STRL SZ7 (GLOVE) IMPLANT
GLOVE BIO SURGEON STRL SZ7.5 (GLOVE) ×2 IMPLANT
GLOVE BIO SURGEON STRL SZ8 (GLOVE) IMPLANT
GLOVE SURG ORTHO 8.5 STRL (GLOVE) IMPLANT
GOWN STRL REUS W/ TWL XL LVL3 (GOWN DISPOSABLE) ×1 IMPLANT
GOWN STRL REUS W/TWL XL LVL3 (GOWN DISPOSABLE) ×1
HOLDER FOLEY CATH W/STRAP (MISCELLANEOUS) ×2 IMPLANT
I-SEED AGX100 ×156 IMPLANT
IV NS 1000ML (IV SOLUTION) ×1
IV NS 1000ML BAXH (IV SOLUTION) ×1 IMPLANT
KIT TURNOVER CYSTO (KITS) ×2 IMPLANT
MARKER SKIN DUAL TIP RULER LAB (MISCELLANEOUS) ×2 IMPLANT
PACK CYSTO (CUSTOM PROCEDURE TRAY) ×2 IMPLANT
SUT BONE WAX W31G (SUTURE) IMPLANT
SYR 10ML LL (SYRINGE) ×2 IMPLANT
TUBE CONNECTING 12X1/4 (SUCTIONS) IMPLANT
UNDERPAD 30X30 (UNDERPADS AND DIAPERS) ×4 IMPLANT
WATER STERILE IRR 500ML POUR (IV SOLUTION) ×2 IMPLANT

## 2019-01-09 NOTE — Anesthesia Postprocedure Evaluation (Signed)
Anesthesia Post Note  Patient: [name]  Procedure(s) Performed: RADIOACTIVE SEED IMPLANT/BRACHYTHERAPY IMPLANT (N/A Prostate)     Patient location during evaluation: Phase II Anesthesia Type: General Level of consciousness: awake and alert, oriented and patient cooperative Pain management: pain level controlled Vital Signs Assessment: post-procedure vital signs reviewed and stable Respiratory status: spontaneous breathing, nonlabored ventilation and respiratory function stable Cardiovascular status: blood pressure returned to baseline and stable Postop Assessment: no apparent nausea or vomiting, able to ambulate and adequate PO intake Anesthetic complications: no    Last Vitals:  Vitals:   01/09/19 1745 01/09/19 1800  BP: (!) 141/79   Pulse: 76 69  Resp: 19 16  Temp:    SpO2: 100% 98%    Last Pain:  Vitals:   01/09/19 1830  TempSrc:   PainSc: 0-No pain                 Jamielynn Wigley,E. Evva Din

## 2019-01-09 NOTE — H&P (Signed)
H&P  Chief Complaint: Prostate cancer  History of Present Illness: [name] is a 78 year old male with a PSA of 4.45, prostate a 39 g who underwent prostate biopsy.  Biopsy revealed Gleason 3+4 = 7, 3+3 = 6 prostate cancer in 5 to 40% of the cores.  He had a normal exam.  He has a history of lower urinary tract symptoms and was on tamsulosin and Vesicare.  He has been well without cough or congestion.  No fever, no dysuria.  No gross hematuria.  Past Medical History:  Diagnosis Date  . Anxiety   . Arthritis    shoulders , back  . Back pain    buldging disc  . CKD (chronic kidney disease), stage III ([place])   . Coronary artery disease cardiologist-- [name]   a. s/p SVG-LCx at time of AVR on 09-02-2011.  . Diastolic CHF, chronic ([place])    followed by [name]  . First degree heart block   . Hemorrhoids   . History of palpitations    per pt 05/ 2020 notified his cardiologsit, [name], had an episode of palpitations;  [name] ordered Zio patch montior results 10-17-2018, per [name] note no signifiant abnormality (01-08-2019 pt stated has not had any palpitations since that one time)  . History of pulmonary embolus (PE)    09-11-2011 post op surgery CABG/ AVR on 09-02-2011----bilateral PE and right pleural effusionper pt completed coumadin  . Hyperlipidemia    takes Trilipix daily  . Hypertension   . Nocturia   . Pre-diabetes   . Prostate cancer [facility]) urologist-- dr Jalexis Breed/  dr [name]   dx 07-24-2018,  Stage T1c, Gleason 3+4  . S/P aortic valve replacement   . S/P aortic valve replacement with prosthetic valve 09/02/2018   bioprosthetic  . Wears dentures    full upper and partial lower  . Wears glasses    Past Surgical History:  Procedure Laterality Date  . AORTIC VALVE REPLACEMENT (AVR)/CORONARY ARTERY BYPASS GRAFTING (CABG)  09-02-2011   dr [name]   SVG to LCx and Bioprosthetic aortic valve  . BLEPHAROPLASTY Right 2009 approx.  [name] CARDIAC CATHETERIZATION   08-19-2018   [name]   severe single vessel LCx and severe aortic valve stenosis, normal LVF  . COLONOSCOPY     2007/2012  . CYST REMOVAL NECK  1970s   benign per pt  . ESOPHAGOGASTRODUODENOSCOPY      Home Medications:  No medications prior to admission.   Allergies:  Allergies  Allergen Reactions  . Codeine Other (See Comments)    Nervous and jittery.    Family History  Problem Relation Age of Onset  . Hypertension Father        CABG at 79   . Cancer Mother   . Hypertension Sister   . Ovarian cancer Sister   . Anesthesia problems Neg Hx   . Hypotension Neg Hx   . Malignant hyperthermia Neg Hx   . Pseudochol deficiency Neg Hx    Social History:  reports that he has never smoked. He has never used smokeless tobacco. He reports current alcohol use. He reports that he does not use drugs.  ROS: A complete review of systems was performed.  All systems are negative except for pertinent findings as noted. Review of Systems  All other systems reviewed and are negative.    Physical Exam:  Vital signs in last 24 hours: Weight:  [81.6 kg] 81.6 kg (08/31  1231) General:  Alert and oriented, No acute distress HEENT: Normocephalic, atraumatic Cardiovascular: Regular rate and rhythm Lungs: Regular rate and effort Abdomen: Soft, nontender, nondistended, no abdominal masses Back: No CVA tenderness Extremities: No edema Neurologic: Grossly intact  Laboratory Data:  No results found for this or any previous visit (from the past 24 hour(s)). Recent Results (from the past 240 hour(s))  SARS CORONAVIRUS 2 (TAT 6-12 HRS) Nasal Swab Aptima Multi Swab     Status: None   Collection Time: 01/05/19  8:40 AM   Specimen: Aptima Multi Swab; Nasal Swab  Result Value Ref Range Status   SARS Coronavirus 2 NEGATIVE NEGATIVE Final    Comment: (NOTE) SARS-CoV-2 target nucleic acids are NOT DETECTED. The SARS-CoV-2 RNA is generally detectable in upper and lower respiratory specimens  during the acute phase of infection. Negative results do not preclude SARS-CoV-2 infection, do not rule out co-infections with other pathogens, and should not be used as the sole basis for treatment or other patient management decisions. Negative results must be combined with clinical observations, patient history, and epidemiological information. The expected result is Negative. Fact Sheet for Patients: SugarRoll.be Fact Sheet for Healthcare Providers: https://www.woods-mathews.com/ This test is not yet approved or cleared by the [place] FDA and  has been authorized for detection and/or diagnosis of SARS-CoV-2 by FDA under an Emergency Use Authorization (EUA). This EUA will remain  in effect (meaning this test can be used) for the duration of the COVID-19 declaration under Section 56 4(b)(1) of the Act, 21 U.S.C. section 360bbb-3(b)(1), unless the authorization is terminated or revoked sooner. Performed at [facility], [place] [address], [place],  [address]    Creatinine: Recent Labs    01/05/19 [id number]  CREATININE 1.37*    Impression/Assessment:  Stage IIB prostate cancer - I discussed with the patient the nature, potential benefits, risks and alternatives to prostate brachytherapy seed implant, cystoscopy, biodegradable gel insertion, including side effects of the proposed treatment, the likelihood of the patient achieving the goals of the procedure, and any potential problems that might occur during the procedure or recuperation. All questions answered. Patient elects to proceed.  Unfortunately, he went to [facility] and therefore will need another COVID test.  He was sent over for rapid COVID and if that comes back negative and we can coordinate we will do the brachytherapy later this afternoon.    [name] 01/09/2019

## 2019-01-09 NOTE — Transfer of Care (Signed)
Immediate Anesthesia Transfer of Care Note  Patient: [name]  Procedure(s) Performed: RADIOACTIVE SEED IMPLANT/BRACHYTHERAPY IMPLANT (N/A Prostate)  Patient Location: PACU  Anesthesia Type:General  Level of Consciousness: awake, alert , oriented and patient cooperative  Airway & Oxygen Therapy: Patient Spontanous Breathing and Patient connected to nasal cannula oxygen  Post-op Assessment: Report given to RN and Post -op Vital signs reviewed and stable  Post vital signs: Reviewed and stable  Last Vitals:  Vitals Value Taken Time  BP 143/86 01/09/19 1709  Temp    Pulse 81 01/09/19 1710  Resp 17 01/09/19 1710  SpO2 99 % 01/09/19 1710  Vitals shown include unvalidated device data.  Last Pain:  Vitals:   01/09/19 1014  TempSrc: Oral      Patients Stated Pain Goal: 4 [id number] [phone])  Complications: No apparent anesthesia complications

## 2019-01-09 NOTE — Anesthesia Procedure Notes (Signed)
Procedure Name: LMA Insertion Date/Time: 01/09/2019 3:51 PM Performed by: Wanita Chamberlain, CRNA Pre-anesthesia Checklist: Patient identified, Emergency Drugs available, Suction available and Patient being monitored Patient Re-evaluated:Patient Re-evaluated prior to induction Oxygen Delivery Method: Circle system utilized Preoxygenation: Pre-oxygenation with 100% oxygen Induction Type: IV induction Ventilation: Mask ventilation without difficulty LMA: LMA inserted LMA Size: 5.0 Number of attempts: 1 Placement Confirmation: breath sounds checked- equal and bilateral,  CO2 detector and positive ETCO2 Tube secured with: Tape Dental Injury: Teeth and Oropharynx as per pre-operative assessment

## 2019-01-09 NOTE — Op Note (Addendum)
Preoperative diagnosis: Stage IIB (T1cNxMx) Prostate cancer Postoperative diagnosis: Stage II B PCa, urethral stricture  Procedure: Prostate brachytherapy seed implant, Cystoscopy  Surgeon: [name]  Radiation oncologist: [name]  Anesthesia: Gen.  Indication for procedure: 78 year old with stage IIB prostate cancer who elected to proceed with prostate brachytherapy.  I did talk to the patient about biodegradable gel insertion, but I had forgotten his insurance denied the appeal.  Dr. [name] was aware and said they slightly altered the plan for the posterior seeds to keep a little bit lower dose near the rectum without compromising oncologic control.  Findings: Nurse noted there was a lot of resistance when placing the Foley and it took longer to push it into the bladder than normal.    On fluoroscopic imaging there was adequate coverage of the prostate. On cystoscopy the urethra appeared normal apart from a stricture that have been dilated from the catheter of the distal bulbar urethra, the prostatic urethra appeared normal, the trigone and ureteral orifices appeared normal with clear efflux. The bladder mucosa appeared normal. There were no stones, foreign bodies or seeds visualized in the bladder.  Dose:145 Gy  Description of procedure: After consent was obtained patient brought to the operating room. After adequate anesthesia he is placed in lithotomy position and the transrectal ultrasound probe and perineal template positioned. Catheters and brachytherapy seeds were placed per Dr. Johny Shears plan. A total of 27 catheters and 78 active sources (I-125) were placed. The anchoring needles, template and ultrasound were removed. Scout flouro imaging was obtained of the implant. The Foley was removed. Another image was obtained. The patient was prepped again and cystoscopy was performed which was noted to be normal. He was in and out catheterized. He was awakened taken to the recovery room  in stable condition.  Complications: None  Blood loss: Minimal  Specimens: None  Drains: None   Disposition: Patient stable to PACU. Dr. [name] and I called and went over the procedure and post-op care with [facility].

## 2019-01-09 NOTE — Discharge Instructions (Signed)
Brachytherapy for Prostate Cancer, Care After ° °This sheet gives you information about how to care for yourself after your procedure. Your health care provider may also give you more specific instructions. If you have problems or questions, contact your health care provider. °What can I expect after the procedure? °After the procedure, it is common to have: °· Trouble passing urine. °· Blood in the urine or semen. °· Constipation. °· Frequent feeling of an urgent need to urinate. °· Bruising, swelling, and tenderness of the area behind the scrotum (perineum). °· Bloating and gas. °· Fatigue. °· Burning or pain in the rectum. °· Problems getting or keeping an erection (erectile dysfunction). °· Nausea. °Follow these instructions at home: °Managing pain, stiffness, and swelling °· If directed, apply ice to the affected area: °? Put ice in a plastic bag. °? Place a towel between your skin and the bag. °? Leave the ice on for 20 minutes, 2-3 times a day. °· Try not to sit directly on the area behind the scrotum. A soft cushion can help with discomfort. °Activity °· Do not drive for 24 hours if you were given a medicine to help you relax (sedative). °· Do not drive or use heavy machinery while taking prescription pain medicine. °· Rest as told by your health care provider. °· Most people can return to normal activities a few days or weeks after the procedure. Ask your health care provider what activities are safe for you. °Eating and drinking °· Drink enough fluid to keep your urine clear or pale yellow. °· Eat a healthy, balanced diet. This includes lean proteins, whole grains, and plenty of fruits and vegetables. °General instructions °· Take over-the-counter and prescription medicines only as told by your health care provider. °· Keep all follow-up visits as told by your health care provider. This is important. You may still need additional treatment. °· Do not take baths, swim, or use a hot tub until your health  care provider approves. Shower and wash the area behind the scrotum gently. °· Do not have sex for one week after the treatment, or until your health care provider approves. °· If you have permanent, low-dose brachytherapy implants: °? Limit close contact with children and pregnant women for 2 months or as told by your health care provider. This is important because of the radiation that is still active in the prostate. °? You may set off radioactive sensors, such as airport screenings. Ask your health care provider for a document that explains your treatment. °? You may be instructed to use a condom during sex for the first 2 months after low-dose brachytherapy. °Contact a health care provider if: °· You have a fever or chills. °· You do not have a bowel movement for 3-4 days after the procedure. °· You have diarrhea for 3-4 days after the procedure. °· You develop any new symptoms, such as problems with urinating or erectile dysfunction. °· You have abdomen (abdominal) pain. °· You have more blood in your urine. °Get help right away if: °· You cannot urinate. °· There is excessive bleeding from your rectum. °· You have unusual drainage coming from your rectum. °· You have severe pain in the treated area that does not go away with pain medicine. °· You have severe nausea or vomiting. °Summary °· If you have permanent, low-dose brachytherapy implants, limit close contact with children and pregnant women for 2 months or as told by your health care provider. This is important because of the radiation   that is still active in the prostate. °· Talk with your health care provider about your risk of brachytherapy side effects, such as erectile dysfunction or urinary problems. Your health care provider will be able to recommend possible treatment options. °· Keep all follow-up visits as told by your health care provider. This is important. You may need additional treatment. °This information is not intended to replace  advice given to you by your health care provider. Make sure you discuss any questions you have with your health care provider. °Document Released: 05/29/2010 Document Revised: 04/08/2017 Document Reviewed: 05/28/2016 °Elsevier Patient Education © 2020 [employer] ° ° ° ° °Post Anesthesia Home Care Instructions ° °Activity: °Get plenty of rest for the remainder of the day. A responsible individual must stay with you for 24 hours following the procedure.  °For the next 24 hours, DO NOT: °-Drive a car °-Operate machinery °-Drink alcoholic beverages °-Take any medication unless instructed by your physician °-Make any legal decisions or sign important papers. ° °Meals: °Start with liquid foods such as gelatin or soup. Progress to regular foods as tolerated. Avoid greasy, spicy, heavy foods. If nausea and/or vomiting occur, drink only clear liquids until the nausea and/or vomiting subsides. Call your physician if vomiting continues. ° °Special Instructions/Symptoms: °Your throat may feel dry or sore from the anesthesia or the breathing tube placed in your throat during surgery. If this causes discomfort, gargle with warm salt water. The discomfort should disappear within 24 hours. ° °If you had a scopolamine patch placed behind your ear for the management of post- operative nausea and/or vomiting: ° °1. The medication in the patch is effective for 72 hours, after which it should be removed.  Wrap patch in a tissue and discard in the trash. Wash hands thoroughly with soap and water. °2. You may remove the patch earlier than 72 hours if you experience unpleasant side effects which may include dry mouth, dizziness or visual disturbances. °3. Avoid touching the patch. Wash your hands with soap and water after contact with the patch. °   ° ° °

## 2019-01-10 ENCOUNTER — Encounter (HOSPITAL_BASED_OUTPATIENT_CLINIC_OR_DEPARTMENT_OTHER): Payer: Self-pay | Admitting: Urology

## 2019-01-12 NOTE — Progress Notes (Signed)
  Radiation Oncology         [phone] ________________________________  Name: [name] MRN: [id number]  Date: 01/12/2019  DOB: [birth year]       Prostate Seed Implant  [id number], [name], MD  No ref. provider found  DIAGNOSIS: 78 y.o. gentleman with Stage T1c adenocarcinoma of the prostate with Gleason score of 3+4, and PSA of 4.45   ICD-10-CM   1. Prostate cancer ([place])  C61 DG Chest 2 View    DG Chest 2 View    Discharge patient    PROCEDURE: Insertion of radioactive I-125 seeds into the prostate gland.  RADIATION DOSE: 145 Gy, definitive therapy.  TECHNIQUE: [name] was brought to the operating room with the urologist. He was placed in the dorsolithotomy position. He was catheterized and a rectal tube was inserted. The perineum was shaved, prepped and draped. The ultrasound probe was then introduced into the rectum to see the prostate gland.  TREATMENT DEVICE: A needle grid was attached to the ultrasound probe stand and anchor needles were placed.  3D PLANNING: The prostate was imaged in 3D using a sagittal sweep of the prostate probe. These images were transferred to the planning computer. There, the prostate, urethra and rectum were defined on each axial reconstructed image. Then, the software created an optimized 3D plan and a few seed positions were adjusted. The quality of the plan was reviewed using [facility] information for the target and the following two organs at risk:  Urethra and Rectum.  Then the accepted plan was printed and handed off to the radiation therapist.  Under my supervision, the custom loading of the seeds and spacers was carried out and loaded into sealed vicryl sleeves.  These pre-loaded needles were then placed into the needle holder.[name]  PROSTATE VOLUME STUDY:  Using transrectal ultrasound the volume of the prostate was verified to be 48.1 cc.  SPECIAL TREATMENT PROCEDURE/SUPERVISION AND HANDLING: The pre-loaded needles were then delivered  under sagittal guidance. A total of 27 needles were used to deposit 78 seeds in the prostate gland. The individual seed activity was 0.461 mCi.  SpaceOAR:  No  COMPLEX SIMULATION: At the end of the procedure, an anterior radiograph of the pelvis was obtained to document seed positioning and count. Cystoscopy was performed to check the urethra and bladder.  MICRODOSIMETRY: At the end of the procedure, the patient was emitting 0.197 mR/hr at 1 meter. Accordingly, he was considered safe for hospital discharge.  PLAN: The patient will return to the radiation oncology clinic for post implant CT dosimetry in three weeks.   ________________________________  Sheral Apley [name], M.D.

## 2019-01-30 ENCOUNTER — Telehealth: Payer: Self-pay | Admitting: *Deleted

## 2019-01-30 DIAGNOSIS — N35011 Post-traumatic bulbous urethral stricture: Secondary | ICD-10-CM | POA: Diagnosis not present

## 2019-01-30 NOTE — Telephone Encounter (Signed)
Called patient to remind of post seed appts. for 01-31-19 and his MRI for 02-01-19, spoke with patient and he is aware of these appts.

## 2019-01-31 ENCOUNTER — Telehealth: Payer: Self-pay | Admitting: *Deleted

## 2019-01-31 ENCOUNTER — Encounter: Payer: Self-pay | Admitting: Urology

## 2019-01-31 ENCOUNTER — Ambulatory Visit
Admission: RE | Admit: 2019-01-31 | Discharge: 2019-01-31 | Disposition: A | Discharge status: Home / Self Care | Payer: Medicare Other | Source: Ambulatory Visit | Attending: Radiation Oncology | Admitting: Radiation Oncology

## 2019-01-31 ENCOUNTER — Ambulatory Visit
Admission: RE | Admit: 2019-01-31 | Discharge: 2019-01-31 | Disposition: A | Discharge status: Home / Self Care | Payer: Medicare Other | Source: Ambulatory Visit | Attending: Urology | Admitting: Urology

## 2019-01-31 ENCOUNTER — Other Ambulatory Visit: Payer: Self-pay

## 2019-01-31 VITALS — BP 136/80 | HR 70 | Temp 98.0°F | Resp 18 | Ht 70.0 in | Wt 184.8 lb

## 2019-01-31 DIAGNOSIS — Z7982 Long term (current) use of aspirin: Secondary | ICD-10-CM | POA: Diagnosis not present

## 2019-01-31 DIAGNOSIS — R3911 Hesitancy of micturition: Secondary | ICD-10-CM | POA: Insufficient documentation

## 2019-01-31 DIAGNOSIS — R350 Frequency of micturition: Secondary | ICD-10-CM | POA: Insufficient documentation

## 2019-01-31 DIAGNOSIS — Z923 Personal history of irradiation: Secondary | ICD-10-CM | POA: Insufficient documentation

## 2019-01-31 DIAGNOSIS — C61 Malignant neoplasm of prostate: Secondary | ICD-10-CM | POA: Insufficient documentation

## 2019-01-31 NOTE — Progress Notes (Signed)
Radiation Oncology         (336) [phone] ________________________________  Name: [name] MRN: [id number]  Date: 01/31/2019  DOB: 11-30-1940  Post-Seed Follow-Up Visit Note  CC: [name], MD  [name], MD  Diagnosis:   78 y.o. gentleman with Stage T1c adenocarcinoma of the prostate with Gleason score of 3+4, and PSA of 4.45    ICD-10-CM   1. Malignant neoplasm of prostate ([place])  C61     Interval Since Last Radiation:  3 weeks 01/09/19:  Insertion of radioactive I-125 seeds into the prostate gland; 145 Gy, definitive/boost therapy without placement of SpaceOAR gel.  Narrative:  The patient returns today for routine follow-up.  He is complaining of increased urinary frequency and urinary hesitation symptoms. He filled out a questionnaire regarding urinary function today providing and overall IPSS score of 7 characterizing his symptoms as mild with occasional weak stream and frequency. He specifically denies dysuria, gross hematuria, straining to void, incomplete emptying or incontinence.  He reports nocturia 3x/night which is unchanged from his baseline.  His pre-implant score was 2, on Flomax and Vesicare. He has continued taking Flomax and Vesicare as prescribed.  He denies any abdominal pain or bowel symptoms.  Overall, he is quite pleased with his progress to date.  ALLERGIES:  is allergic to codeine.  Meds: Current Outpatient Medications  Medication Sig Dispense Refill  . amoxicillin (AMOXIL) 500 MG capsule Take 2,000 mg by mouth as needed. Prior to dental procedcures    . aspirin EC 81 MG tablet Take 81 mg by mouth daily.    . Azilsartan Medoxomil (EDARBI) 80 MG TABS Take 80 mg by mouth daily.     . cephALEXin (KEFLEX) 500 MG capsule Take 1 capsule (500 mg total) by mouth 2 (two) times daily. 10 capsule 0  . chlorthalidone (HYGROTON) 25 MG tablet Take 12.5 mg by mouth daily.  4  . Choline Fenofibrate (FENOFIBRIC ACID) 45 MG CPDR Take 45 mg by mouth at  bedtime.  4  . HYDROcodone-acetaminophen (NORCO/VICODIN) 5-325 MG tablet Take 1-2 tablets by mouth every 6 (six) hours as needed for moderate pain. 10 tablet 0  . meclizine (ANTIVERT) 12.5 MG tablet Take 12.5 mg by mouth 2 (two) times daily as needed for dizziness.    . Multiple Vitamin (MULTIVITAMIN) tablet Take 1 tablet by mouth daily.    . Omega-3 Fatty Acids (OMEGA-3 FISH OIL PO) Take 1 capsule by mouth daily.    . solifenacin (VESICARE) 5 MG tablet Take 5 mg by mouth daily.    . Tamsulosin HCl (FLOMAX) 0.4 MG CAPS Take 0.4 mg by mouth daily after supper.     [name] XIIDRA 5 % SOLN Place 1 drop into both eyes 2 (two) times daily.  6   No current facility-administered medications for this visit.     Physical Findings: In general this is a well appearing African American male in no acute distress. He's alert and oriented x4 and appropriate throughout the examination. Cardiopulmonary assessment is negative for acute distress and he exhibits normal effort.   Lab Findings: Lab Results  Component Value Date   WBC 3.5 (L) 01/05/2019   HGB 13.7 01/05/2019   HCT 42.5 01/05/2019   MCV 90.4 01/05/2019   PLT 158 01/05/2019    Radiographic Findings:  Patient underwent CT imaging in our clinic for post implant dosimetry. The CT will be reviewed by Dr. [name] to confirm there is an adequate distribution of radioactive seeds throughout the prostate gland  and ensure that there are no seeds in or near the rectum. We suspect the final radiation plan and dosimetry will show appropriate coverage of the prostate gland. He understands that we will call and inform him of any unexpected findings on further review of his imaging and dosimetry.  Impression/Plan:   78 y.o. gentleman with Stage T1c adenocarcinoma of the prostate with Gleason score of 3+4, and PSA of 4.45. The patient is recovering from the effects of radiation. His urinary symptoms should gradually improve over the next 4-6 months. We talked about  this today. He is encouraged by his improvement already and is otherwise pleased with his outcome. We also talked about long-term follow-up for prostate cancer following seed implant. He understands that ongoing PSA determinations and digital rectal exams will help perform surveillance to rule out disease recurrence. He was recently evaluated by Jiles Crocker, NP on 01/30/19 and was advised that he could anticipate a follow up appointment with Dr. [name] in Dec. 2020. He will call to schedule this visit in the near future. He understands what to expect with his PSA measures. Patient was also educated today about some of the long-term effects from radiation including a small risk for rectal bleeding and possibly erectile dysfunction. We talked about some of the general management approaches to these potential complications. However, I did encourage the patient to contact our office or return at any point if he has questions or concerns related to his previous radiation and prostate cancer.    Nicholos Johns, PA-C

## 2019-01-31 NOTE — Progress Notes (Signed)
Weight and vitals stable. Patient denies pain. Post seed IPSS 7. Patient did not have SpaceOAR placed thus MRI for later today has been cancelled. Patient expressed surprise that [place] wasn't placed. Patient verbalizes his understanding that insurance wouldn't cover this but patient wanted to pay out of pocket. Denies dysuria, hematuria, urinary leakage or incontinence. Reports he was seen by Jiles Crocker yesterday and scheduled to follow up with Dr. [name] in December.   BP 136/80   Pulse 70   Temp 98 F (36.7 C)   Resp 18   Ht 5\' 10"  (1.778 m)   Wt 184 lb 12.8 oz (83.8 kg)   SpO2 100%   BMI 26.52 kg/m

## 2019-01-31 NOTE — Telephone Encounter (Signed)
Called patient to inform that he doesn't need to have the MRI on 02-01-19 due to not having space oar, because the insurance did not pay for it, patient verified understanding this

## 2019-02-01 ENCOUNTER — Ambulatory Visit (HOSPITAL_COMMUNITY): Payer: Medicare Other

## 2019-02-04 NOTE — Progress Notes (Signed)
  Radiation Oncology         [phone] ________________________________  Name: [name] MRN: [id number]  Date: 01/31/2019  DOB: 05-24-1940  COMPLEX SIMULATION NOTE  NARRATIVE:  The patient was brought to the [facility] today following prostate seed implantation approximately one month ago.  Identity was confirmed.  All relevant records and images related to the planned course of therapy were reviewed.  Then, the patient was set-up supine.  CT images were obtained.  The CT images were loaded into the planning software.  Then the prostate and rectum were contoured.  Treatment planning then occurred.  The implanted iodine 125 seeds were identified by the physics staff for projection of radiation distribution  I have requested : 3D Simulation  I have requested a DVH of the following structures: Prostate and rectum.    ________________________________  Sheral Apley [name], M.D.

## 2019-02-14 ENCOUNTER — Encounter: Payer: Self-pay | Admitting: Radiation Oncology

## 2019-02-14 DIAGNOSIS — C61 Malignant neoplasm of prostate: Secondary | ICD-10-CM | POA: Diagnosis not present

## 2019-03-05 DIAGNOSIS — Q245 Malformation of coronary vessels: Secondary | ICD-10-CM | POA: Diagnosis not present

## 2019-03-05 DIAGNOSIS — M25519 Pain in unspecified shoulder: Secondary | ICD-10-CM | POA: Diagnosis not present

## 2019-03-05 DIAGNOSIS — C61 Malignant neoplasm of prostate: Secondary | ICD-10-CM | POA: Diagnosis not present

## 2019-03-05 DIAGNOSIS — I350 Nonrheumatic aortic (valve) stenosis: Secondary | ICD-10-CM | POA: Diagnosis not present

## 2019-03-05 DIAGNOSIS — N400 Enlarged prostate without lower urinary tract symptoms: Secondary | ICD-10-CM | POA: Diagnosis not present

## 2019-03-05 DIAGNOSIS — I119 Hypertensive heart disease without heart failure: Secondary | ICD-10-CM | POA: Diagnosis not present

## 2019-03-05 DIAGNOSIS — M159 Polyosteoarthritis, unspecified: Secondary | ICD-10-CM | POA: Diagnosis not present

## 2019-03-05 DIAGNOSIS — Z79899 Other long term (current) drug therapy: Secondary | ICD-10-CM | POA: Diagnosis not present

## 2019-03-05 DIAGNOSIS — N183 Chronic kidney disease, stage 3 unspecified: Secondary | ICD-10-CM | POA: Diagnosis not present

## 2019-03-05 DIAGNOSIS — E1165 Type 2 diabetes mellitus with hyperglycemia: Secondary | ICD-10-CM | POA: Diagnosis not present

## 2019-03-05 DIAGNOSIS — E7800 Pure hypercholesterolemia, unspecified: Secondary | ICD-10-CM | POA: Diagnosis not present

## 2019-03-10 NOTE — Progress Notes (Signed)
Cardiology Office Note:    Date:  03/12/2019   ID:  [name], [name] 1940/12/04, MRN [id number]  PCP:  [name], MD  Cardiologist:  [name], MD   Referring MD: [name], MD   Chief Complaint  Patient presents with  . Coronary Artery Disease  . Cardiac Valve Problem    History of Present Illness:    [name] is a 78 y.o. male with a hx of bioprosthetic aortic valve replacement placed in 2013, hypertension, CAD with CABG 2013, bilateral carotid bruits, and hyperlipidemia.  Postop bilateral pulmonary emboli.  [name] denies chest discomfort on exertion.  He has not had syncope or exertional dyspnea.  He also denies lower extremity edema and orthopnea.  Since downwardly adjusting his blood pressure medications, his exertional tolerance has significantly improved.  Despite this, he occasionally has blood pressures less than 123XX123 mmHg systolic.  Past Medical History:  Diagnosis Date  . Anxiety   . Arthritis    shoulders , back  . Back pain    buldging disc  . CKD (chronic kidney disease), stage III   . Coronary artery disease cardiologist-- [name]   a. s/p SVG-LCx at time of AVR on 09-02-2011.  . Diastolic CHF, chronic ([place])    followed by [name]  . First degree heart block   . Hemorrhoids   . History of palpitations    per pt 05/ 2020 notified his cardiologsit, [name], had an episode of palpitations;  [name] ordered Zio patch montior results 10-17-2018, per [name] note no signifiant abnormality (01-08-2019 pt stated has not had any palpitations since that one time)  . History of pulmonary embolus (PE)    09-11-2011 post op surgery CABG/ AVR on 09-02-2011----bilateral PE and right pleural effusionper pt completed coumadin  . Hyperlipidemia    takes Trilipix daily  . Hypertension   . Nocturia   . Pre-diabetes   . Prostate cancer [facility]) urologist-- dr eskridge/  dr [name]   dx 07-24-2018,  Stage T1c, Gleason  3+4  . S/P aortic valve replacement   . S/P aortic valve replacement with prosthetic valve 09/02/2018   bioprosthetic  . Wears dentures    full upper and partial lower  . Wears glasses     Past Surgical History:  Procedure Laterality Date  . AORTIC VALVE REPLACEMENT (AVR)/CORONARY ARTERY BYPASS GRAFTING (CABG)  09-02-2011   dr [name]   SVG to LCx and Bioprosthetic aortic valve  . BLEPHAROPLASTY Right 2009 approx.  [name] CARDIAC CATHETERIZATION  08-19-2018   [name]   severe single vessel LCx and severe aortic valve stenosis, normal LVF  . COLONOSCOPY     2007/2012  . CYST REMOVAL NECK  1970s   benign per pt  . ESOPHAGOGASTRODUODENOSCOPY    . RADIOACTIVE SEED IMPLANT N/A 01/09/2019   Procedure: RADIOACTIVE SEED IMPLANT/BRACHYTHERAPY IMPLANT;  Surgeon: [name], MD;  Location: [facility];  Service: Urology;  Laterality: N/A;  78 seeds implanted cysto per Dr [name], no seeds found    Current Medications: Current Meds  Medication Sig  . aspirin EC 81 MG tablet Take 81 mg by mouth daily.  . Azilsartan Medoxomil (EDARBI) 80 MG TABS Take 40 mg by mouth daily.   . chlorthalidone (HYGROTON) 25 MG tablet Take 12.5 mg by mouth daily.  . Choline Fenofibrate (FENOFIBRIC ACID) 45 MG CPDR Take 45 mg by mouth at bedtime.  . Multiple Vitamin (MULTIVITAMIN)  tablet Take 1 tablet by mouth daily.  . Omega-3 Fatty Acids (OMEGA-3 FISH OIL PO) Take 1 capsule by mouth daily.  . solifenacin (VESICARE) 5 MG tablet Take 5 mg by mouth daily.  . Tamsulosin HCl (FLOMAX) 0.4 MG CAPS Take 0.4 mg by mouth daily after supper.   [name] XIIDRA 5 % SOLN Place 1 drop into both eyes 2 (two) times daily.     Allergies:   Codeine   Social History   Socioeconomic History  . Marital status: Married    Spouse name: Not on file  . Number of children: Not on file  . Years of education: Not on file  . Highest education level: Not on file  Occupational History  . Occupation: retired  Photographer  . Financial resource strain: Not on file  . Food insecurity    Worry: Not on file    Inability: Not on file  . Transportation needs    Medical: No    Non-medical: No  Tobacco Use  . Smoking status: Never Smoker  . Smokeless tobacco: Never Used  Substance and Sexual Activity  . Alcohol use: Yes    Comment: wine occasional  . Drug use: No  . Sexual activity: Yes  Lifestyle  . Physical activity    Days per week: Not on file    Minutes per session: Not on file  . Stress: Not on file  Relationships  . Social Herbalist on phone: Not on file    Gets together: Not on file    Attends religious service: Not on file    Active member of club or organization: Not on file    Attends meetings of clubs or organizations: Not on file    Relationship status: Not on file  Other Topics Concern  . Not on file  Social History Narrative  . Not on file     Family History: The patient's family history includes Cancer in his mother; Hypertension in his father and sister; Ovarian cancer in his sister. There is no history of Anesthesia problems, Hypotension, Malignant hyperthermia, or Pseudochol deficiency.  ROS:   Please see the history of present illness.    Had chest pain 1 day last week that he described as a left localized parasternal burning that was not associated with dyspnea or other complaint.  Lasted approximately 2 hours.  Was low intensity severity.  He feels it was "gas".  It was gone by the next morning and has not recurred.  All other systems reviewed and are negative.  EKGs/Labs/Other Studies Reviewed:    The following studies were reviewed today: 2D Doppler echocardiogram 2018:  Study Conclusions  - Left ventricle: The cavity size was normal. Wall thickness was   increased in a pattern of moderate LVH. Systolic function was   normal. The estimated ejection fraction was in the range of 55%   to 60%. Wall motion was normal; there were no regional wall    motion abnormalities. - Aortic valve: Normal appearing tissue AVR with trivial central AR - Mitral valve: Mild prolapse of anterior leaflet with mild   eccentric posteriorly directed MR. - Left atrium: The atrium was mildly dilated.  EKG:  EKG sinus rhythm, biatrial abnormality, and first-degree AV block.  Recent Labs: 01/05/2019: ALT 18; BUN 21; Creatinine, Ser 1.37; Hemoglobin 13.7; Platelets 158; Potassium 3.7; Sodium 138  Recent Lipid Panel    Component Value Date/Time   CHOL 105 09/05/2011 0544   TRIG 131  09/05/2011 0544   HDL 23 (L) 09/05/2011 0544   CHOLHDL 4.6 09/05/2011 0544   VLDL 26 09/05/2011 0544   LDLCALC 56 09/05/2011 0544    Physical Exam:    VS:  BP 138/76   Pulse 75   Ht 5\' 10"  (1.778 m)   Wt 185 lb 12.8 oz (84.3 kg)   SpO2 96%   BMI 26.66 kg/m     Wt Readings from Last 3 Encounters:  03/12/19 185 lb 12.8 oz (84.3 kg)  01/31/19 184 lb 12.8 oz (83.8 kg)  01/09/19 182 lb 14.4 oz (83 kg)     GEN: Compatible with age. No acute distress HEENT: Normal NECK: No JVD. LYMPHATICS: No lymphadenopathy CARDIAC: 2/6 to 3/6 crescendo decrescendo systolic murmur RRR without diastolic murmur, gallop, or edema. VASCULAR:  Normal Pulses. No bruits. RESPIRATORY:  Clear to auscultation without rales, wheezing or rhonchi  ABDOMEN: Soft, non-tender, non-distended, No pulsatile mass, MUSCULOSKELETAL: No deformity  SKIN: Warm and dry NEUROLOGIC:  Alert and oriented x 3 PSYCHIATRIC:  Normal affect   ASSESSMENT:    1. S/P AVR   2. Chronic diastolic heart failure (HCC)   3. Atherosclerosis of native coronary artery of native heart without angina pectoris   4. Palpitations   5. Essential hypertension   6. Educated about COVID-19 virus infection    PLAN:    In order of problems listed above:  1. Bioprosthetic aortic valve last evaluated by echocardiography 2018 as noted above.  Plan echo prior to the next office visit in 6 to 9 months.  Notify us if heart failure,  syncope, or anginal complaints. . 2. No evidence of volume overload. 3. Localized left chest burning.  We will monitor this complaint and if it becomes recurrent, he will need ischemic evaluation. 4. No clinical evidence of atrial fibrillation. 5. Blood pressure issues at home with occasional recordings less than 123456 systolic.  Identified his blood pressure range to be 100 to 140 mmHg over 50 to 80 mmHg.  He should notify us if frequent readings less than 123XX123 mmHg systolic.  He knows to measure his blood pressure at least 2 hours after a.m. medications. 6. 3W's discussed and being adhered to according to the patient.  Overall education and awareness concerning secondary risk prevention was discussed in detail: LDL less than 70, hemoglobin A1c less than 7, blood pressure target less than 130/80 mmHg, >150 minutes of moderate aerobic activity per week, avoidance of smoking, weight control (via diet and exercise), and continued surveillance/management of/for obstructive sleep apnea.    Medication Adjustments/Labs and Tests Ordered: Current medicines are reviewed at length with the patient today.  Concerns regarding medicines are outlined above.  Orders Placed This Encounter  Procedures  . ECHOCARDIOGRAM COMPLETE   No orders of the defined types were placed in this encounter.   There are no Patient Instructions on file for this visit.   Signed, [name], MD  03/12/2019 10:08 AM    [place]

## 2019-03-12 ENCOUNTER — Other Ambulatory Visit: Payer: Self-pay

## 2019-03-12 ENCOUNTER — Ambulatory Visit (INDEPENDENT_AMBULATORY_CARE_PROVIDER_SITE_OTHER): Payer: Medicare Other | Admitting: Interventional Cardiology

## 2019-03-12 ENCOUNTER — Encounter: Payer: Self-pay | Admitting: Interventional Cardiology

## 2019-03-12 VITALS — BP 138/76 | HR 75 | Ht 70.0 in | Wt 185.8 lb

## 2019-03-12 DIAGNOSIS — I10 Essential (primary) hypertension: Secondary | ICD-10-CM | POA: Diagnosis not present

## 2019-03-12 DIAGNOSIS — R002 Palpitations: Secondary | ICD-10-CM | POA: Diagnosis not present

## 2019-03-12 DIAGNOSIS — Z952 Presence of prosthetic heart valve: Secondary | ICD-10-CM

## 2019-03-12 DIAGNOSIS — I2510 Atherosclerotic heart disease of native coronary artery without angina pectoris: Secondary | ICD-10-CM | POA: Diagnosis not present

## 2019-03-12 DIAGNOSIS — I5032 Chronic diastolic (congestive) heart failure: Secondary | ICD-10-CM

## 2019-03-12 DIAGNOSIS — Z7189 Other specified counseling: Secondary | ICD-10-CM | POA: Diagnosis not present

## 2019-03-12 NOTE — Patient Instructions (Signed)
Medication Instructions:  Your physician recommends that you continue on your current medications as directed. Please refer to the Current Medication list given to you today. *If you need a refill on your cardiac medications before your next appointment, please call your pharmacy*  Lab Work: None If you have labs (blood work) drawn today and your tests are completely normal, you will receive your results only by: [name] MyChart Message (if you have MyChart) OR . A paper copy in the mail If you have any lab test that is abnormal or we need to change your treatment, we will call you to review the results.  Testing/Procedures: Your physician has requested that you have an echocardiogram 1-2 weeks prior to seeing [name] back in 6-9 months. Echocardiography is a painless test that uses sound waves to create images of your heart. It provides your doctor with information about the size and shape of your heart and how well your heart's chambers and valves are working. This procedure takes approximately one hour. There are no restrictions for this procedure.    Follow-Up: At [facility], you and your health needs are our priority.  As part of our continuing mission to provide you with exceptional heart care, we have created designated Provider Care Teams.  These Care Teams include your primary Cardiologist (physician) and Advanced Practice Providers (APPs -  Physician Assistants and Nurse Practitioners) who all work together to provide you with the care you need, when you need it.  Your next appointment:   6 months-9 months  The format for your next appointment:   In Person  Provider:   You may see [name], MD or one of the following Advanced Practice Providers on your designated Care Team:    Truitt Merle, NP  Cecilie Kicks, NP  Kathyrn Drown, NP   Other Instructions   The range we would like for your blood pressure to be is 100-140/50-80.

## 2019-03-24 NOTE — Progress Notes (Signed)
  Radiation Oncology         (336) [phone] ________________________________  Name: [name] MRN: [id number]  Date: 02/14/2019  DOB: [birth year]  3D Planning Note   Prostate Brachytherapy Post-Implant Dosimetry  Diagnosis: 78 y.o. gentleman with Stage T1c adenocarcinoma of the prostate with Gleason score of 3+4, and PSA of 4.45  Narrative: On a previous date, [name] returned following prostate seed implantation for post implant planning. He underwent CT scan complex simulation to delineate the three-dimensional structures of the pelvis and demonstrate the radiation distribution.  Since that time, the seed localization, and complex isodose planning with dose volume histograms have now been completed.  Results:   Prostate Coverage - The dose of radiation delivered to the 90% or more of the prostate gland (D90) was 104.17% of the prescription dose. This exceeds our goal of greater than 90%. Rectal Sparing - The volume of rectal tissue receiving the prescription dose or higher was 0.42 cc. This falls under our thresholds tolerance of 1.0 cc.  Impression: The prostate seed implant appears to show adequate target coverage and appropriate rectal sparing.  Plan:  The patient will continue to follow with urology for ongoing PSA determinations. I would anticipate a high likelihood for local tumor control with minimal risk for rectal morbidity.  ________________________________  Sheral Apley [name], M.D.

## 2019-04-02 ENCOUNTER — Emergency Department (HOSPITAL_COMMUNITY): Payer: Medicare Other

## 2019-04-02 ENCOUNTER — Encounter (HOSPITAL_COMMUNITY): Payer: Self-pay | Admitting: *Deleted

## 2019-04-02 ENCOUNTER — Emergency Department (HOSPITAL_COMMUNITY)
Admission: EM | Admit: 2019-04-02 | Discharge: 2019-04-03 | Disposition: A | Discharge status: Home / Self Care | Payer: Medicare Other | Attending: Emergency Medicine | Admitting: Emergency Medicine

## 2019-04-02 ENCOUNTER — Other Ambulatory Visit: Payer: Self-pay

## 2019-04-02 DIAGNOSIS — Z86718 Personal history of other venous thrombosis and embolism: Secondary | ICD-10-CM | POA: Diagnosis not present

## 2019-04-02 DIAGNOSIS — R0789 Other chest pain: Secondary | ICD-10-CM | POA: Insufficient documentation

## 2019-04-02 DIAGNOSIS — I2510 Atherosclerotic heart disease of native coronary artery without angina pectoris: Secondary | ICD-10-CM | POA: Insufficient documentation

## 2019-04-02 DIAGNOSIS — Z8546 Personal history of malignant neoplasm of prostate: Secondary | ICD-10-CM | POA: Insufficient documentation

## 2019-04-02 DIAGNOSIS — Z7982 Long term (current) use of aspirin: Secondary | ICD-10-CM | POA: Diagnosis not present

## 2019-04-02 DIAGNOSIS — I5032 Chronic diastolic (congestive) heart failure: Secondary | ICD-10-CM | POA: Diagnosis not present

## 2019-04-02 DIAGNOSIS — I130 Hypertensive heart and chronic kidney disease with heart failure and stage 1 through stage 4 chronic kidney disease, or unspecified chronic kidney disease: Secondary | ICD-10-CM | POA: Diagnosis not present

## 2019-04-02 DIAGNOSIS — Z79899 Other long term (current) drug therapy: Secondary | ICD-10-CM | POA: Diagnosis not present

## 2019-04-02 DIAGNOSIS — R079 Chest pain, unspecified: Secondary | ICD-10-CM | POA: Diagnosis not present

## 2019-04-02 DIAGNOSIS — Z951 Presence of aortocoronary bypass graft: Secondary | ICD-10-CM | POA: Diagnosis not present

## 2019-04-02 DIAGNOSIS — N183 Chronic kidney disease, stage 3 unspecified: Secondary | ICD-10-CM | POA: Insufficient documentation

## 2019-04-02 LAB — BASIC METABOLIC PANEL
Anion gap: 7 (ref 5–15)
BUN: 13 mg/dL (ref 8–23)
CO2: 30 mmol/L (ref 22–32)
Calcium: 9.5 mg/dL (ref 8.9–10.3)
Chloride: 101 mmol/L (ref 98–111)
Creatinine, Ser: 1.62 mg/dL — ABNORMAL HIGH (ref 0.61–1.24)
GFR calc Af Amer: 46 mL/min — ABNORMAL LOW (ref >60)
GFR calc non Af Amer: 40 mL/min — ABNORMAL LOW (ref >60)
Glucose, Bld: 97 mg/dL (ref 70–99)
Potassium: 3.6 mmol/L (ref 3.5–5.1)
Sodium: 138 mmol/L (ref 135–145)

## 2019-04-02 LAB — CBC
HCT: 41.6 % (ref 39.0–52.0)
Hemoglobin: 13.5 g/dL (ref 13.0–17.0)
MCH: 29 pg (ref 26.0–34.0)
MCHC: 32.5 g/dL (ref 30.0–36.0)
MCV: 89.5 fL (ref 80.0–100.0)
Platelets: 197 10*3/uL (ref 150–400)
RBC: 4.65 MIL/uL (ref 4.22–5.81)
RDW: 13.4 % (ref 11.5–15.5)
WBC: 5 10*3/uL (ref 4.0–10.5)
nRBC: 0 % (ref 0.0–0.2)

## 2019-04-02 LAB — TROPONIN I (HIGH SENSITIVITY): Troponin I (High Sensitivity): 13 ng/L (ref <18)

## 2019-04-02 MED ORDER — SODIUM CHLORIDE 0.9% FLUSH: 3.0000 mL | Freq: Once | INTRAVENOUS | Status: DC

## 2019-04-02 NOTE — ED Triage Notes (Signed)
Pt with onset of dull chest pain since around noon today, non radiating, no SOB. Took an extra aspirin for pain relief.

## 2019-04-03 DIAGNOSIS — R0789 Other chest pain: Secondary | ICD-10-CM | POA: Diagnosis not present

## 2019-04-03 LAB — TROPONIN I (HIGH SENSITIVITY): Troponin I (High Sensitivity): 15 ng/L (ref <18)

## 2019-04-03 NOTE — ED Provider Notes (Signed)
[facility] EMERGENCY DEPARTMENT Provider Note   CSN: [id number] Arrival date & time: 04/02/19  2045     History   Chief Complaint Chief Complaint  Patient presents with   Chest Pain    HPI [name] is a 78 y.o. male.     78 y.o. male with a hx of bioprosthetic aortic valve replacement placed in 2013, HTN, CAD with CABG 2013, bilateral carotid bruits, HLD, PE in postop setting, preDM, CKD presents to the ED for c/o chest pain.  States that pain began around noon yesterday, 04/02/2019, when patient was "out and about".  Was able to continue his duties despite his pain, arriving home around 1500. Describes the pain as a dull pain, slightly burning.  He has experienced similar symptoms in the past and has had some spontaneous resolution in discomfort since onset.  Did take aspirin prior to arrival.  Felt that pain was mildly aggravated when he would "move around", but he did not have any shortness of breath, dyspnea on exertion, diaphoresis, lightheadedness, syncope or near syncope, leg swelling, fevers, increased belching, nausea or vomiting.  Denies any radiation of his pain.  The history is provided by the patient. No language interpreter was used.  Chest Pain   Past Medical History:  Diagnosis Date   Anxiety    Arthritis    shoulders , back   Back pain    buldging disc   CKD (chronic kidney disease), stage III    Coronary artery disease cardiologist-- [name]   a. s/p SVG-LCx at time of AVR on 09-02-2011.   Diastolic CHF, chronic ([place])    followed by [name]   First degree heart block    Hemorrhoids    History of palpitations    per pt 05/ 2020 notified his cardiologsit, [name], had an episode of palpitations;  [name] ordered Zio patch montior results 10-17-2018, per [name] note no signifiant abnormality (01-08-2019 pt stated has not had any palpitations since that one time)   History of pulmonary embolus (PE)     09-11-2011 post op surgery CABG/ AVR on 09-02-2011----bilateral PE and right pleural effusionper pt completed coumadin   Hyperlipidemia    takes Trilipix daily   Hypertension    Nocturia    Pre-diabetes    Prostate cancer [facility]) urologist-- dr eskridge/  dr [name]   dx 07-24-2018,  Stage T1c, Gleason 3+4   S/P aortic valve replacement    S/P aortic valve replacement with prosthetic valve 09/02/2018   bioprosthetic   Wears dentures    full upper and partial lower   Wears glasses     Patient Active Problem List   Diagnosis Date Noted   Malignant neoplasm of prostate ([place]) 09/17/2018   Lumbar radiculopathy 11/19/2017   Chronic diastolic heart failure ([place]) 04/24/2014   Coronary atherosclerosis of native coronary artery 04/23/2013   S/P AVR 09/20/2011   Pulmonary embolism ([place]) 09/13/2011   Healthcare-associated pneumonia 09/12/2011   Hypertension     Past Surgical History:  Procedure Laterality Date   AORTIC VALVE REPLACEMENT (AVR)/CORONARY ARTERY BYPASS GRAFTING (CABG)  09-02-2011   dr [name]   SVG to LCx and Bioprosthetic aortic valve   BLEPHAROPLASTY Right 2009 approx.   CARDIAC CATHETERIZATION  08-19-2018   [name]   severe single vessel LCx and severe aortic valve stenosis, normal LVF   COLONOSCOPY     2007/2012   CYST REMOVAL NECK  1970s  benign per pt   ESOPHAGOGASTRODUODENOSCOPY     RADIOACTIVE SEED IMPLANT N/A 01/09/2019   Procedure: RADIOACTIVE SEED IMPLANT/BRACHYTHERAPY IMPLANT;  Surgeon: [name], MD;  Location: [facility];  Service: Urology;  Laterality: N/A;  78 seeds implanted cysto per Dr [name], no seeds found        Home Medications    Prior to Admission medications   Medication Sig Start Date End Date Taking? Authorizing Provider  aspirin EC 81 MG tablet Take 81 mg by mouth daily.    [provider]  Azilsartan Medoxomil (EDARBI) 80 MG TABS Take 40 mg by mouth daily.     [provider]  chlorthalidone (HYGROTON) 25 MG tablet Take 12.5 mg by mouth daily. 11/07/17   [provider]  Choline Fenofibrate (FENOFIBRIC ACID) 45 MG CPDR Take 45 mg by mouth at bedtime. 05/13/15   [provider]  Multiple Vitamin (MULTIVITAMIN) tablet Take 1 tablet by mouth daily.    [provider]  Omega-3 Fatty Acids (OMEGA-3 FISH OIL PO) Take 1 capsule by mouth daily.    [provider]  solifenacin (VESICARE) 5 MG tablet Take 5 mg by mouth daily.    [provider]  Tamsulosin HCl (FLOMAX) 0.4 MG CAPS Take 0.4 mg by mouth daily after supper.     [provider]  XIIDRA 5 % SOLN Place 1 drop into both eyes 2 (two) times daily. 10/30/15   [provider]    Family History Family History  Problem Relation Age of Onset   Hypertension Father        CABG at 56    Cancer Mother    Hypertension Sister    Ovarian cancer Sister    Anesthesia problems Neg Hx    Hypotension Neg Hx    Malignant hyperthermia Neg Hx    Pseudochol deficiency Neg Hx     Social History Social History   Tobacco Use   Smoking status: Never Smoker   Smokeless tobacco: Never Used  Substance Use Topics   Alcohol use: Yes    Comment: wine occasional   Drug use: No     Allergies   Codeine   Review of Systems Review of Systems  Cardiovascular: Positive for chest pain.  Ten systems reviewed and are negative for acute change, except as noted in the HPI.     Physical Exam Updated Vital Signs BP 125/73    Pulse 63    Temp 98.3 F (36.8 C) (Oral)    Resp 10    SpO2 99%   Physical Exam Vitals signs and nursing note reviewed.  Constitutional:      General: He is not in acute distress.    Appearance: He is well-developed. He is not diaphoretic.     Comments: Nontoxic appearing and in NAD  HENT:     Head: Normocephalic and atraumatic.  Eyes:     General: No scleral icterus.    Conjunctiva/sclera: Conjunctivae normal.    Neck:     Musculoskeletal: Normal range of motion.  Cardiovascular:     Rate and Rhythm: Normal rate and regular rhythm.     Pulses: Normal pulses.     Heart sounds: Murmur present.     Comments: Midline sternotomy scar. Pulmonary:     Effort: Pulmonary effort is normal. No respiratory distress.     Breath sounds: No stridor. No wheezing, rhonchi or rales.     Comments: Lungs CTAB Musculoskeletal: Normal range of motion.  Comments: No BLE edema.  Skin:    General: Skin is warm and dry.     Coloration: Skin is not pale.     Findings: No erythema or rash.  Neurological:     Mental Status: He is alert and oriented to person, place, and time.  Psychiatric:        Behavior: Behavior normal.      ED Treatments / Results  Labs (all labs ordered are listed, but only abnormal results are displayed) Labs Reviewed  BASIC METABOLIC PANEL - Abnormal; Notable for the following components:      Result Value   Creatinine, Ser 1.62 (*)    GFR calc non Af Amer 40 (*)    GFR calc Af Amer 46 (*)    All other components within normal limits  CBC  TROPONIN I (HIGH SENSITIVITY)  TROPONIN I (HIGH SENSITIVITY)    EKG EKG Interpretation  Date/Time:  Monday April 02 2019 20:52:19 EST Ventricular Rate:  69 PR Interval:  334 QRS Duration: 86 QT Interval:  390 QTC Calculation: 417 R Axis:   72 Text Interpretation: Sinus rhythm with 1st degree A-V block Otherwise normal ECG No significant change since last tracing Confirmed by Orpah Greek [phone]) on 04/03/2019 4:45:04 AM   Radiology Dg Chest 2 View  Result Date: 04/02/2019 CLINICAL DATA:  Chest pain EXAM: CHEST - 2 VIEW COMPARISON:  12/14/2018 FINDINGS: Mitral valve replacement. Negative for heart failure. Lungs clear without infiltrate effusion or mass. No change from the prior study. IMPRESSION: No active cardiopulmonary disease. Electronically Signed   By: Franchot Gallo M.D.   On: 04/02/2019 21:23     Procedures Procedures (including critical care time)  Medications Ordered in ED Medications  sodium chloride flush (NS) 0.9 % injection 3 mL (has no administration in time range)    4:55 AM Case discussed with Dr. Paticia Stack on call for [facility]. Dr. Paticia Stack has reviewed the patient's EKG, troponins. Expresses comfort with close outpatient follow up in the clinic.    Initial Impression / Assessment and Plan / ED Course  I have reviewed the triage vital signs and the nursing notes.  Pertinent labs & imaging results that were available during my care of the patient were reviewed by me and considered in my medical decision making (see chart for details).        78 year old male presents to the emergency department for evaluation of left-sided chest pain.  Reports that he has had similar pain before, but his pain today lasted longer prompting ED evaluation.  Describes the pain as dull, nonradiating.  Not associated with shortness of breath, diaphoresis, lightheadedness, syncope or near syncope, leg swelling, fevers.  Pain has spontaneously been improving since onset.    While patient does have a heart score of 4 given age and risk factors, symptoms quite atypical for ACS.  His troponin is negative x2, EKG with unchanged first-degree heart block.  Chest x-ray negative for acute cardiopulmonary abnormality.  Case discussed with cardiology who believe the patient is stable for outpatient follow-up with [name].  I have advised the patient to call his cardiologist in the morning to schedule a follow-up appointment.  Return precautions discussed and provided. Patient discharged in stable condition with no unaddressed concerns.   Final Clinical Impressions(s) / ED Diagnoses   Final diagnoses:  Left-sided chest pain    ED Discharge Orders    None       Antonietta Breach, PA-C 04/03/19 [id number]  Orpah Greek, MD 04/04/19 [phone]

## 2019-04-03 NOTE — ED Notes (Signed)
Discharge instructions discussed with pt. Pt verbalized understanding. Pt stable and ambulatory. No signature pad available. 

## 2019-04-03 NOTE — Discharge Instructions (Signed)
Your work-up in the emergency department today has been reassuring.  We recommend close follow-up with your cardiologist.  Call in the morning to schedule a visit for follow-up.  Continue your daily medications.  Should you experience recurrent or worsening pain, promptly return to the ED for repeat assessment.  You may also return for any other new or concerning symptoms.

## 2019-04-03 NOTE — ED Notes (Signed)
Pt called x3 in the waiting room. No reply. 

## 2019-04-17 DIAGNOSIS — C61 Malignant neoplasm of prostate: Secondary | ICD-10-CM | POA: Diagnosis not present

## 2019-04-30 NOTE — Progress Notes (Signed)
Cardiology Office Note   Date:  05/01/2019   ID:  Michael, Rocha [birth year], MRN [id number]  PCP:  [name], MD  Cardiologist:  [name]    Chief Complaint  Patient presents with  . Chest Pain      History of Present Illness: [name] is a 78 y.o. male who presents for chest pain  Hx of bioprosthetic aortic valve replacement placed in 2013, hypertension, CAD with CABG 2013, bilateral carotid bruits, and hyperlipidemia.  Postop bilateral pulmonary emboli.  On last visit 03/12/19 BP were lower he was to monitor.  He had localized Lt chest burning if recurrent to call for ischemic workup  He was seen in ER 04/03/19 for dull chest pain, slight burning.  He took ASA , no associated symptoms.  EKG SR with 1st degree AV block, otherwise normal. CXR normal.  Hs troponin 13, 14.   Cr 1.62, CBC normal.   Last nuc 2017 EF 63%, low risk study,  cath 2013 LAD, LM patent, LCX 50-75% tandem lesions before a large bifurcation second OM RCA mid 30-50% stenosis  Today BP stable no acute EKG changes. Last LDL 94 no associated symptoms with chest pain.  None since ER visit. No palpitations.   Past Medical History:  Diagnosis Date  . Anxiety   . Arthritis    shoulders , back  . Back pain    buldging disc  . CKD (chronic kidney disease), stage III   . Coronary artery disease cardiologist-- [name]   a. s/p SVG-LCx at time of AVR on 09-02-2011.  . Diastolic CHF, chronic ([place])    followed by [name]  . First degree heart block   . Hemorrhoids   . History of palpitations    per pt 05/ 2020 notified his cardiologsit, [name], had an episode of palpitations;  [name] ordered Zio patch montior results 10-17-2018, per [name] note no signifiant abnormality (01-08-2019 pt stated has not had any palpitations since that one time)  . History of pulmonary embolus (PE)    09-11-2011 post op surgery CABG/ AVR on 09-02-2011----bilateral PE and right  pleural effusionper pt completed coumadin  . Hyperlipidemia    takes Trilipix daily  . Hypertension   . Nocturia   . Pre-diabetes   . Prostate cancer [facility]) urologist-- dr eskridge/  dr [name]   dx 07-24-2018,  Stage T1c, Gleason 3+4  . S/P aortic valve replacement   . S/P aortic valve replacement with prosthetic valve 09/02/2018   bioprosthetic  . Wears dentures    full upper and partial lower  . Wears glasses     Past Surgical History:  Procedure Laterality Date  . AORTIC VALVE REPLACEMENT (AVR)/CORONARY ARTERY BYPASS GRAFTING (CABG)  09-02-2011   dr [name]   SVG to LCx and Bioprosthetic aortic valve  . BLEPHAROPLASTY Right 2009 approx.  [name] CARDIAC CATHETERIZATION  08-19-2018   [name]   severe single vessel LCx and severe aortic valve stenosis, normal LVF  . COLONOSCOPY     2007/2012  . CYST REMOVAL NECK  1970s   benign per pt  . ESOPHAGOGASTRODUODENOSCOPY    . RADIOACTIVE SEED IMPLANT N/A 01/09/2019   Procedure: RADIOACTIVE SEED IMPLANT/BRACHYTHERAPY IMPLANT;  Surgeon: [name], MD;  Location: [facility];  Service: Urology;  Laterality: N/A;  78 seeds implanted cysto per Dr [name], no seeds found     Current Outpatient Medications  Medication Sig Dispense  Refill  . aspirin EC 81 MG tablet Take 81 mg by mouth daily.    . Azilsartan Medoxomil (EDARBI) 80 MG TABS Take 40 mg by mouth daily.     . chlorthalidone (HYGROTON) 25 MG tablet Take 12.5 mg by mouth daily.  4  . Choline Fenofibrate (FENOFIBRIC ACID) 45 MG CPDR Take 45 mg by mouth at bedtime.  4  . Multiple Vitamin (MULTIVITAMIN) tablet Take 1 tablet by mouth daily.    . Omega-3 Fatty Acids (OMEGA-3 FISH OIL PO) Take 1 capsule by mouth daily.    . solifenacin (VESICARE) 5 MG tablet Take 5 mg by mouth daily.    . Tamsulosin HCl (FLOMAX) 0.4 MG CAPS Take 0.4 mg by mouth daily after supper.     [name] XIIDRA 5 % SOLN Place 1 drop into both eyes 2 (two) times daily.  6  . amoxicillin (AMOXIL) 500  MG capsule Take 2,000 mg by mouth. For dental procedure     No current facility-administered medications for this visit.    Allergies:   Codeine    Social History:  The patient  reports that he has never smoked. He has never used smokeless tobacco. He reports current alcohol use. He reports that he does not use drugs.   Family History:  The patient's family history includes Cancer in his mother; Hypertension in his father and sister; Ovarian cancer in his sister.    ROS:  General:no colds or fevers, no weight changes Skin:no rashes or ulcers HEENT:no blurred vision, no congestion CV:see HPI PUL:see HPI GI:no diarrhea constipation or melena, no indigestion GU:no hematuria, no dysuria MS:no joint pain, no claudication Neuro:no syncope, no lightheadedness Endo:no diabetes, no thyroid disease  Wt Readings from Last 3 Encounters:  05/01/19 188 lb 12.8 oz (85.6 kg)  03/12/19 185 lb 12.8 oz (84.3 kg)  01/31/19 184 lb 12.8 oz (83.8 kg)     PHYSICAL EXAM: VS:  BP 118/64   Pulse 79   Ht 5\' 10"  (1.778 m)   Wt 188 lb 12.8 oz (85.6 kg)   SpO2 99%   BMI 27.09 kg/m  , BMI Body mass index is 27.09 kg/m. General:Pleasant affect, NAD Skin:Warm and dry, brisk capillary refill HEENT:normocephalic, sclera clear, mucus membranes moist Neck:supple, no JVD, no bruits  Heart:S1S2 RRR with 2/6 systolic murmur, no X 3 gallup, rub or click Lungs:clear without rales, rhonchi, or wheezes [id number], non tender, + BS, do not palpate liver spleen or masses Ext:no lower ext edema, 2+ pedal pulses, 2+ radial pulses Neuro:alert and oriented X 3, MAE, follows commands, + facial symmetry    EKG:  EKG is ordered today. The ekg ordered today demonstrates  SR at 79, 1st degree AV block 266 ms, no acute ST changes.     Recent Labs: 01/05/2019: ALT 18 04/02/2019: BUN 13; Creatinine, Ser 1.62; Hemoglobin 13.5; Platelets 197; Potassium 3.6; Sodium 138    Lipid Panel    Component Value Date/Time    CHOL 105 09/05/2011 0544   TRIG 131 09/05/2011 0544   HDL 23 (L) 09/05/2011 0544   CHOLHDL 4.6 09/05/2011 0544   VLDL 26 09/05/2011 0544   LDLCALC 56 09/05/2011 0544       Other studies Reviewed: Additional studies/ records that were reviewed today include: . Echo 2018 Study Conclusions  - Left ventricle: The cavity size was normal. Wall thickness was   increased in a pattern of moderate LVH. Systolic function was   normal. The estimated ejection fraction was in the  range of 55%   to 60%. Wall motion was normal; there were no regional wall   motion abnormalities. - Aortic valve: Normal appearing tissue AVR with trivial central AR - Mitral valve: Mild prolapse of anterior leaflet with mild   eccentric posteriorly directed MR. - Left atrium: The atrium was mildly dilated   Monitor 14 days  Rare PVCs, one 4 beat VT slavo, Mobitz II AV block noted nocturnally, complaints do not correlate with arrhythmia.  ASSESSMENT AND PLAN:  1.  Chest pain with single vessel CABG with AVR in 2013, with VG to LCX.  No further pain since ER visit - discussed with Dr. Lovena Le will do lexiscan myoview. If abnormal explained to pt will need cardiac cath - though will review with [name].    2.  AVR in 2013, bioprosthetic, stable in 2018, plan echo in 6-9 months.   3.  Recent hx stage T1 adenocarcinoma of prostate, with prostate seed implantation.  . Followed by Dr. [name]    4.  palpitations resolved  5.  HTN stable.   6.  Chronic 1st degree AV block stable.    Current medicines are reviewed with the patient today.  The patient Has no concerns regarding medicines.  The following changes have been made:  See above Labs/ tests ordered today include:see above  Disposition:   FU:  see above  Signed, Cecilie Kicks, NP  05/01/2019 9:14 AM    [address], [place], [place] Phone: [phone];  Fax: [phone]

## 2019-05-01 ENCOUNTER — Encounter

## 2019-05-01 ENCOUNTER — Other Ambulatory Visit: Payer: Self-pay

## 2019-05-01 ENCOUNTER — Encounter: Payer: Self-pay | Admitting: Cardiology

## 2019-05-01 ENCOUNTER — Encounter (INDEPENDENT_AMBULATORY_CARE_PROVIDER_SITE_OTHER): Payer: Self-pay

## 2019-05-01 ENCOUNTER — Encounter: Payer: Self-pay | Admitting: *Deleted

## 2019-05-01 ENCOUNTER — Ambulatory Visit (INDEPENDENT_AMBULATORY_CARE_PROVIDER_SITE_OTHER): Payer: Medicare Other | Admitting: Cardiology

## 2019-05-01 VITALS — BP 118/64 | HR 79 | Ht 70.0 in | Wt 188.8 lb

## 2019-05-01 DIAGNOSIS — Z7189 Other specified counseling: Secondary | ICD-10-CM

## 2019-05-01 DIAGNOSIS — I2510 Atherosclerotic heart disease of native coronary artery without angina pectoris: Secondary | ICD-10-CM

## 2019-05-01 DIAGNOSIS — I10 Essential (primary) hypertension: Secondary | ICD-10-CM

## 2019-05-01 DIAGNOSIS — R079 Chest pain, unspecified: Secondary | ICD-10-CM | POA: Diagnosis not present

## 2019-05-01 DIAGNOSIS — Z952 Presence of prosthetic heart valve: Secondary | ICD-10-CM

## 2019-05-01 NOTE — Patient Instructions (Signed)
Medication Instructions:  Your physician recommends that you continue on your current medications as directed. Please refer to the Current Medication list given to you today.  *If you need a refill on your cardiac medications before your next appointment, please call your pharmacy*  Lab Work: None ordered  If you have labs (blood work) drawn today and your tests are completely normal, you will receive your results only by: [name] MyChart Message (if you have MyChart) OR . A paper copy in the mail If you have any lab test that is abnormal or we need to change your treatment, we will call you to review the results.  Testing/Procedures: Your physician has requested that you have a lexiscan myoview. For further information please visit [email or web address]. Please follow instruction sheet, as given.    Follow-Up: At [facility], you and your health needs are our priority.  As part of our continuing mission to provide you with exceptional heart care, we have created designated Provider Care Teams.  These Care Teams include your primary Cardiologist (physician) and Advanced Practice Providers (APPs -  Physician Assistants and Nurse Practitioners) who all work together to provide you with the care you need, when you need it.  Your next appointment:   2 week(s)  The format for your next appointment:   In Person  Provider:   You may see [name], MD or one of the following Advanced Practice Providers on your designated Care Team:    Truitt Merle, NP  Cecilie Kicks, NP  Kathyrn Drown, NP   Other Instructions

## 2019-05-02 ENCOUNTER — Telehealth (HOSPITAL_COMMUNITY): Payer: Self-pay

## 2019-05-02 NOTE — Telephone Encounter (Signed)
Instructions left on the patient's answering machine. Asked to call back with any questions. S.Kanya Potteiger EMTP 

## 2019-05-08 ENCOUNTER — Ambulatory Visit (HOSPITAL_COMMUNITY): Payer: Medicare Other | Attending: Cardiology

## 2019-05-08 ENCOUNTER — Other Ambulatory Visit: Payer: Self-pay

## 2019-05-08 VITALS — Ht 70.0 in | Wt 188.0 lb

## 2019-05-08 DIAGNOSIS — R079 Chest pain, unspecified: Secondary | ICD-10-CM

## 2019-05-08 DIAGNOSIS — I5032 Chronic diastolic (congestive) heart failure: Secondary | ICD-10-CM | POA: Diagnosis present

## 2019-05-08 LAB — MYOCARDIAL PERFUSION IMAGING
LV dias vol: 97 mL (ref 62–150)
LV sys vol: 30 mL
Peak HR: 86 {beats}/min
Rest HR: 53 {beats}/min
SDS: 0
SRS: 0
SSS: 0
TID: 0.92

## 2019-05-08 MED ORDER — TECHNETIUM TC 99M TETROFOSMIN IV KIT
10.2000 | PACK | Freq: Once | INTRAVENOUS | Status: AC | PRN
  Administered 2019-05-08: 10.2 via INTRAVENOUS
  Filled 2019-05-08: qty 11

## 2019-05-08 MED ORDER — REGADENOSON 0.4 MG/5ML IV SOLN
0.4000 mg | Freq: Once | INTRAVENOUS | Status: AC
  Administered 2019-05-08: 0.4 mg via INTRAVENOUS

## 2019-05-08 MED ORDER — TECHNETIUM TC 99M TETROFOSMIN IV KIT
31.7000 | PACK | Freq: Once | INTRAVENOUS | Status: AC | PRN
  Administered 2019-05-08: 31.7 via INTRAVENOUS
  Filled 2019-05-08: qty 32

## 2019-05-17 ENCOUNTER — Ambulatory Visit: Payer: Medicare Other | Admitting: Cardiology

## 2019-05-24 DIAGNOSIS — R350 Frequency of micturition: Secondary | ICD-10-CM | POA: Diagnosis not present

## 2019-05-24 DIAGNOSIS — N35011 Post-traumatic bulbous urethral stricture: Secondary | ICD-10-CM | POA: Diagnosis not present

## 2019-05-24 DIAGNOSIS — C61 Malignant neoplasm of prostate: Secondary | ICD-10-CM | POA: Diagnosis not present

## 2019-06-25 DIAGNOSIS — M25512 Pain in left shoulder: Secondary | ICD-10-CM | POA: Diagnosis not present

## 2019-06-25 DIAGNOSIS — I080 Rheumatic disorders of both mitral and aortic valves: Secondary | ICD-10-CM | POA: Diagnosis not present

## 2019-06-25 DIAGNOSIS — C61 Malignant neoplasm of prostate: Secondary | ICD-10-CM | POA: Diagnosis not present

## 2019-06-25 DIAGNOSIS — E7800 Pure hypercholesterolemia, unspecified: Secondary | ICD-10-CM | POA: Diagnosis not present

## 2019-06-25 DIAGNOSIS — N183 Chronic kidney disease, stage 3 unspecified: Secondary | ICD-10-CM | POA: Diagnosis not present

## 2019-06-25 DIAGNOSIS — M159 Polyosteoarthritis, unspecified: Secondary | ICD-10-CM | POA: Diagnosis not present

## 2019-06-25 DIAGNOSIS — E119 Type 2 diabetes mellitus without complications: Secondary | ICD-10-CM | POA: Diagnosis not present

## 2019-06-25 DIAGNOSIS — R5383 Other fatigue: Secondary | ICD-10-CM | POA: Diagnosis not present

## 2019-06-25 DIAGNOSIS — Z79899 Other long term (current) drug therapy: Secondary | ICD-10-CM | POA: Diagnosis not present

## 2019-06-25 DIAGNOSIS — G933 Postviral fatigue syndrome: Secondary | ICD-10-CM | POA: Diagnosis not present

## 2019-06-25 DIAGNOSIS — Z125 Encounter for screening for malignant neoplasm of prostate: Secondary | ICD-10-CM | POA: Diagnosis not present

## 2019-06-25 DIAGNOSIS — I119 Hypertensive heart disease without heart failure: Secondary | ICD-10-CM | POA: Diagnosis not present

## 2019-06-25 DIAGNOSIS — M47812 Spondylosis without myelopathy or radiculopathy, cervical region: Secondary | ICD-10-CM | POA: Diagnosis not present

## 2019-06-25 DIAGNOSIS — I25810 Atherosclerosis of coronary artery bypass graft(s) without angina pectoris: Secondary | ICD-10-CM | POA: Diagnosis not present

## 2019-07-18 DIAGNOSIS — C61 Malignant neoplasm of prostate: Secondary | ICD-10-CM | POA: Diagnosis not present

## 2019-07-25 DIAGNOSIS — C61 Malignant neoplasm of prostate: Secondary | ICD-10-CM | POA: Diagnosis not present

## 2019-07-25 DIAGNOSIS — R350 Frequency of micturition: Secondary | ICD-10-CM | POA: Diagnosis not present

## 2019-07-25 DIAGNOSIS — N401 Enlarged prostate with lower urinary tract symptoms: Secondary | ICD-10-CM | POA: Diagnosis not present

## 2019-08-08 ENCOUNTER — Ambulatory Visit: Payer: Medicare Other | Admitting: Interventional Cardiology

## 2019-08-23 NOTE — Progress Notes (Signed)
Cardiology Office Note:    Date:  08/24/2019   ID:  Michael, Rocha 10/22/1940, MRN [id number]  PCP:  [name], MD  Cardiologist:  [name], MD   Referring MD: [name], MD   Chief Complaint  Patient presents with  . Cardiac Valve Problem  . Irregular Heart Beat    History of Present Illness:    [name] is a 79 y.o. male with a hx of  bioprosthetic aortic valve replacement placed in 2013, hypertension, CAD with CABG 2013, bilateral carotid bruits, and hyperlipidemia.  Postop bilateral pulmonary emboli.  He is doing okay.  He has occasional palpitations.  He mentioned this at the [place] and a monitor was performed.  It was benign.  Occasional PACs were noted.  No A. fib a flutter.  Asymptomatic brief PSVT noted.  No necessary correlation with complaints.  Denies angina, dyspnea on exertion, orthopnea, PND, syncope, claudication, chills, fever.  Past Medical History:  Diagnosis Date  . Anxiety   . Arthritis    shoulders , back  . Back pain    buldging disc  . CKD (chronic kidney disease), stage III   . Coronary artery disease cardiologist-- [name]   a. s/p SVG-LCx at time of AVR on 09-02-2011.  . Diastolic CHF, chronic ([place])    followed by [name]  . First degree heart block   . Hemorrhoids   . History of palpitations    per pt 05/ 2020 notified his cardiologsit, [name], had an episode of palpitations;  [name] ordered Zio patch montior results 10-17-2018, per [name] note no signifiant abnormality (01-08-2019 pt stated has not had any palpitations since that one time)  . History of pulmonary embolus (PE)    09-11-2011 post op surgery CABG/ AVR on 09-02-2011----bilateral PE and right pleural effusionper pt completed coumadin  . Hyperlipidemia    takes Trilipix daily  . Hypertension   . Nocturia   . Pre-diabetes   . Prostate cancer [facility]) urologist-- dr eskridge/  dr [name]   dx 07-24-2018,  Stage T1c,  Gleason 3+4  . S/P aortic valve replacement   . S/P aortic valve replacement with prosthetic valve 09/02/2018   bioprosthetic  . Wears dentures    full upper and partial lower  . Wears glasses     Past Surgical History:  Procedure Laterality Date  . AORTIC VALVE REPLACEMENT (AVR)/CORONARY ARTERY BYPASS GRAFTING (CABG)  09-02-2011   dr [name]   SVG to LCx and Bioprosthetic aortic valve  . BLEPHAROPLASTY Right 2009 approx.  [name] CARDIAC CATHETERIZATION  08-19-2018   [name]   severe single vessel LCx and severe aortic valve stenosis, normal LVF  . COLONOSCOPY     2007/2012  . CYST REMOVAL NECK  1970s   benign per pt  . ESOPHAGOGASTRODUODENOSCOPY    . RADIOACTIVE SEED IMPLANT N/A 01/09/2019   Procedure: RADIOACTIVE SEED IMPLANT/BRACHYTHERAPY IMPLANT;  Surgeon: [name], MD;  Location: [facility];  Service: Urology;  Laterality: N/A;  78 seeds implanted cysto per Dr [name], no seeds found    Current Medications: Current Meds  Medication Sig  . amoxicillin (AMOXIL) 500 MG capsule Take 2,000 mg by mouth. For dental procedure  . aspirin EC 81 MG tablet Take 81 mg by mouth daily.  . Azilsartan Medoxomil (EDARBI) 80 MG TABS Take 40 mg by mouth daily.   . chlorthalidone (HYGROTON) 25 MG tablet Take 12.5 mg  by mouth daily.  . Choline Fenofibrate (FENOFIBRIC ACID) 45 MG CPDR Take 45 mg by mouth at bedtime.  . Multiple Vitamin (MULTIVITAMIN) tablet Take 1 tablet by mouth daily.  . Omega-3 Fatty Acids (OMEGA-3 FISH OIL PO) Take 1 capsule by mouth daily.  . solifenacin (VESICARE) 5 MG tablet Take 5 mg by mouth daily.  . Tamsulosin HCl (FLOMAX) 0.4 MG CAPS Take 0.4 mg by mouth daily after supper.   [name] XIIDRA 5 % SOLN Place 1 drop into both eyes 2 (two) times daily.     Allergies:   Codeine   Social History   Socioeconomic History  . Marital status: Married    Spouse name: Not on file  . Number of children: Not on file  . Years of education: Not on file  .  Highest education level: Not on file  Occupational History  . Occupation: retired  Tobacco Use  . Smoking status: Never Smoker  . Smokeless tobacco: Never Used  Substance and Sexual Activity  . Alcohol use: Yes    Comment: wine occasional  . Drug use: No  . Sexual activity: Yes  Other Topics Concern  . Not on file  Social History Narrative  . Not on file   Social Determinants of Health   Financial Resource Strain:   . Difficulty of Paying Living Expenses:   Food Insecurity:   . Worried About Charity fundraiser in the Last Year:   . Arboriculturist in the Last Year:   Transportation Needs: No Transportation Needs  . Lack of Transportation (Medical): No  . Lack of Transportation (Non-Medical): No  Physical Activity:   . Days of Exercise per Week:   . Minutes of Exercise per Session:   Stress:   . Feeling of Stress :   Social Connections:   . Frequency of Communication with Friends and Family:   . Frequency of Social Gatherings with Friends and Family:   . Attends Religious Services:   . Active Member of Clubs or Organizations:   . Attends Archivist Meetings:   [name] Marital Status:      Family History: The patient's family history includes Cancer in his mother; Hypertension in his father and sister; Ovarian cancer in his sister. There is no history of Anesthesia problems, Hypotension, Malignant hyperthermia, or Pseudochol deficiency.  ROS:   Please see the history of present illness.    Weight is slightly decreasing but says appetite is good.  All other systems reviewed and are negative.  EKGs/Labs/Other Studies Reviewed:    The following studies were reviewed today:  Holter monitor April 2021: This was performed at [facility].  No evidence of atrial fib or atrial flutter.  One episode of 3-second pause while asleep due to to dropped P waves/nonconducted P waves.  Brief PSVT, asymptomatic.  No correlation between palpitations and rhythm disturbance  was noted.   Last LDL was 116 EKG:  EKG sinus rhythm, significant first-degree AV block, otherwise normal.  No change when compared to prior tracings.  Date of this tracing is May 02, 2019.  Recent Labs: 01/05/2019: ALT 18 04/02/2019: BUN 13; Creatinine, Ser 1.62; Hemoglobin 13.5; Platelets 197; Potassium 3.6; Sodium 138  Recent Lipid Panel    Component Value Date/Time   CHOL 105 09/05/2011 0544   TRIG 131 09/05/2011 0544   HDL 23 (L) 09/05/2011 0544   CHOLHDL 4.6 09/05/2011 0544   VLDL 26 09/05/2011 0544   LDLCALC 56 09/05/2011 0544  Physical Exam:    VS:  BP 118/60   Pulse 73   Ht 5\' 10"  (1.778 m)   Wt 184 lb 9.6 oz (83.7 kg)   SpO2 98%   BMI 26.49 kg/m     Wt Readings from Last 3 Encounters:  08/24/19 184 lb 9.6 oz (83.7 kg)  05/08/19 188 lb (85.3 kg)  05/01/19 188 lb 12.8 oz (85.6 kg)     GEN: Healthy-appearing with stable weight. No acute distress HEENT: Normal NECK: No JVD. LYMPHATICS: No lymphadenopathy CARDIAC:  RRR with soft 2/6 right upper sternal systolic but no diastolic murmur.  Also left lower sternal, apical, and left axillary systolic murmur compatible with mitral regurgitation.  No gallop, gallop or edema. VASCULAR:  Normal Pulses. No bruits. RESPIRATORY:  Clear to auscultation without rales, wheezing or rhonchi  ABDOMEN: Soft, non-tender, non-distended, No pulsatile mass, MUSCULOSKELETAL: No deformity  SKIN: Warm and dry NEUROLOGIC:  Alert and oriented x 3 PSYCHIATRIC:  Normal affect   ASSESSMENT:    1. S/P AVR   2. Atherosclerosis of native coronary artery of native heart, angina presence unspecified   3. Essential hypertension   4. Chronic diastolic heart failure ([place])   5. Educated about COVID-19 virus infection    PLAN:    In order of problems listed above:  1. That is post bioprosthetic aortic valve replacement.  2D Doppler echocardiogram in 1 year. 2. Secondary prevention discussed.  May need to intensify efforts to combat  LDL elevation.  Target should be less than 70. 3. Excellent blood pressure control.  Continue Edarbi. 4. No evidence of volume overload on exam. 5. COVID-19 vaccine and social distancing are being adhered to.  Overall education and awareness concerning primary/secondary risk prevention was discussed in detail: LDL less than 70, hemoglobin A1c less than 7, blood pressure target less than 130/80 mmHg, >150 minutes of moderate aerobic activity per week, avoidance of smoking, weight control (via diet and exercise), and continued surveillance/management of/for obstructive sleep apnea.   Medication Adjustments/Labs and Tests Ordered: Current medicines are reviewed at length with the patient today.  Concerns regarding medicines are outlined above.  Orders Placed This Encounter  Procedures  . ECHOCARDIOGRAM COMPLETE   No orders of the defined types were placed in this encounter.   Patient Instructions  Medication Instructions:  Your physician recommends that you continue on your current medications as directed. Please refer to the Current Medication list given to you today.  *If you need a refill on your cardiac medications before your next appointment, please call your pharmacy*   Lab Work: None If you have labs (blood work) drawn today and your tests are completely normal, you will receive your results only by: [name] MyChart Message (if you have MyChart) OR . A paper copy in the mail If you have any lab test that is abnormal or we need to change your treatment, we will call you to review the results.   Testing/Procedures: Your physician has requested that you have an echocardiogram 1-2 weeks prior to seeing [name] back in a year. Echocardiography is a painless test that uses sound waves to create images of your heart. It provides your doctor with information about the size and shape of your heart and how well your heart's chambers and valves are working. This procedure takes approximately  one hour. There are no restrictions for this procedure.     Follow-Up: At [facility], you and your health needs are our priority.  As part of  our continuing mission to provide you with exceptional heart care, we have created designated Provider Care Teams.  These Care Teams include your primary Cardiologist (physician) and Advanced Practice Providers (APPs -  Physician Assistants and Nurse Practitioners) who all work together to provide you with the care you need, when you need it.  We recommend signing up for the patient portal called "MyChart".  Sign up information is provided on this After Visit Summary.  MyChart is used to connect with patients for Virtual Visits (Telemedicine).  Patients are able to view lab/test results, encounter notes, upcoming appointments, etc.  Non-urgent messages can be sent to your provider as well.   To learn more about what you can do with MyChart, go to [email or web address].    Your next appointment:   12 month(s)  The format for your next appointment:   In Person  Provider:   You may see [name], MD or one of the following Advanced Practice Providers on your designated Care Team:    Truitt Merle, NP  Cecilie Kicks, NP  Kathyrn Drown, NP    Other Instructions      Signed, [name], MD  08/24/2019 10:56 AM    [place]

## 2019-08-24 ENCOUNTER — Encounter: Payer: Self-pay | Admitting: Interventional Cardiology

## 2019-08-24 ENCOUNTER — Ambulatory Visit (INDEPENDENT_AMBULATORY_CARE_PROVIDER_SITE_OTHER): Payer: Medicare Other | Admitting: Interventional Cardiology

## 2019-08-24 ENCOUNTER — Encounter (INDEPENDENT_AMBULATORY_CARE_PROVIDER_SITE_OTHER): Payer: Self-pay

## 2019-08-24 ENCOUNTER — Other Ambulatory Visit: Payer: Self-pay

## 2019-08-24 VITALS — BP 118/60 | HR 73 | Ht 70.0 in | Wt 184.6 lb

## 2019-08-24 DIAGNOSIS — Z7189 Other specified counseling: Secondary | ICD-10-CM

## 2019-08-24 DIAGNOSIS — Z952 Presence of prosthetic heart valve: Secondary | ICD-10-CM

## 2019-08-24 DIAGNOSIS — I10 Essential (primary) hypertension: Secondary | ICD-10-CM | POA: Diagnosis not present

## 2019-08-24 DIAGNOSIS — I2510 Atherosclerotic heart disease of native coronary artery without angina pectoris: Secondary | ICD-10-CM

## 2019-08-24 DIAGNOSIS — I5032 Chronic diastolic (congestive) heart failure: Secondary | ICD-10-CM

## 2019-08-24 NOTE — Patient Instructions (Signed)
Medication Instructions:  Your physician recommends that you continue on your current medications as directed. Please refer to the Current Medication list given to you today.  *If you need a refill on your cardiac medications before your next appointment, please call your pharmacy*   Lab Work: None If you have labs (blood work) drawn today and your tests are completely normal, you will receive your results only by: . MyChart Message (if you have MyChart) OR . A paper copy in the mail If you have any lab test that is abnormal or we need to change your treatment, we will call you to review the results.   Testing/Procedures: Your physician has requested that you have an echocardiogram 1-2 weeks prior to seeing Dr. [name] back in a year. Echocardiography is a painless test that uses sound waves to create images of your heart. It provides your doctor with information about the size and shape of your heart and how well your heart's chambers and valves are working. This procedure takes approximately one hour. There are no restrictions for this procedure.     Follow-Up: At [facility], you and your health needs are our priority.  As part of our continuing mission to provide you with exceptional heart care, we have created designated Provider Care Teams.  These Care Teams include your primary Cardiologist (physician) and Advanced Practice Providers (APPs -  Physician Assistants and Nurse Practitioners) who all work together to provide you with the care you need, when you need it.  We recommend signing up for the patient portal called "MyChart".  Sign up information is provided on this After Visit Summary.  MyChart is used to connect with patients for Virtual Visits (Telemedicine).  Patients are able to view lab/test results, encounter notes, upcoming appointments, etc.  Non-urgent messages can be sent to your provider as well.   To learn more about what you can do with MyChart, go to  [email or web address].    Your next appointment:   12 month(s)  The format for your next appointment:   In Person  Provider:   You may see Henry W [name] III, MD or one of the following Advanced Practice Providers on your designated Care Team:    Lori Gerhardt, NP  Laura Ingold, NP  Jill McDaniel, NP    Other Instructions   

## 2019-11-22 DIAGNOSIS — H35033 Hypertensive retinopathy, bilateral: Secondary | ICD-10-CM | POA: Diagnosis not present

## 2019-11-22 DIAGNOSIS — H25013 Cortical age-related cataract, bilateral: Secondary | ICD-10-CM | POA: Diagnosis not present

## 2019-11-22 DIAGNOSIS — H35373 Puckering of macula, bilateral: Secondary | ICD-10-CM | POA: Diagnosis not present

## 2019-11-22 DIAGNOSIS — H2513 Age-related nuclear cataract, bilateral: Secondary | ICD-10-CM | POA: Diagnosis not present

## 2019-11-22 DIAGNOSIS — H04123 Dry eye syndrome of bilateral lacrimal glands: Secondary | ICD-10-CM | POA: Diagnosis not present

## 2020-01-03 ENCOUNTER — Telehealth: Payer: Self-pay | Admitting: Interventional Cardiology

## 2020-01-03 ENCOUNTER — Other Ambulatory Visit: Payer: Self-pay | Admitting: *Deleted

## 2020-01-03 MED ORDER — CHLORTHALIDONE 25 MG PO TABS: 12.5000 mg | ORAL_TABLET | ORAL | 3 refills | Status: AC

## 2020-01-03 NOTE — Telephone Encounter (Signed)
Decrease the Hygroton to 12,5 mg M, W, F

## 2020-01-03 NOTE — Progress Notes (Unsigned)
Pt states over the last two weeks, BP has been dropping.  SBP usually 90-100 and DBP usually in the 50s.  HR stays in the 70s.  Currently on Edarbi 40mg  QD and Chlorthalidone 12.5mg  QD.  Weight stays 183-185lbs which is stable from last visit in April.  States he started checking his BP because he noticed when he would do the least little thing he would get tired and weak fairly quickly.  Denies lightheadedness or dizziness.

## 2020-01-03 NOTE — Telephone Encounter (Signed)
Pt states over the last two weeks, BP has been dropping.  SBP usually 90-100 and DBP usually in the 50s.  HR stays in the 70s.  Currently on Edarbi 40mg  QD and Chlorthalidone 12.5mg  QD.  Weight stays 183-185lbs which is stable from last visit in April.  States he started checking his BP because he noticed when he would do the least little thing he would get tired and weak fairly quickly.  Denies lightheadedness or dizziness.

## 2020-01-03 NOTE — Telephone Encounter (Signed)
Pt c/o BP issue: STAT if pt c/o blurred vision, one-sided weakness or slurred speech  1. What are your last 5 BP readings? 8/26 100/67 HR 70 8/25 99/55 HR 70  2. Are you having any other symptoms (ex. Dizziness, headache, blurred vision, passed out)? Just feeling really tired after doing something basic.   3. What is your BP issue? Patient states it has been running in the 791'T systolic and diastolic in the 05'W, with HR 70's or a little bit below.

## 2020-01-03 NOTE — Telephone Encounter (Signed)
Spoke with pt and made him aware of recommendations.  Pt verbalized understanding and was in agreement with this plan.

## 2020-01-21 DIAGNOSIS — C61 Malignant neoplasm of prostate: Secondary | ICD-10-CM | POA: Diagnosis not present

## 2020-01-28 DIAGNOSIS — N401 Enlarged prostate with lower urinary tract symptoms: Secondary | ICD-10-CM | POA: Diagnosis not present

## 2020-01-28 DIAGNOSIS — Z8546 Personal history of malignant neoplasm of prostate: Secondary | ICD-10-CM | POA: Diagnosis not present

## 2020-01-28 DIAGNOSIS — N5201 Erectile dysfunction due to arterial insufficiency: Secondary | ICD-10-CM | POA: Diagnosis not present

## 2020-01-28 DIAGNOSIS — R351 Nocturia: Secondary | ICD-10-CM | POA: Diagnosis not present

## 2020-02-07 DIAGNOSIS — R5383 Other fatigue: Secondary | ICD-10-CM | POA: Diagnosis not present

## 2020-02-07 DIAGNOSIS — M899 Disorder of bone, unspecified: Secondary | ICD-10-CM | POA: Diagnosis not present

## 2020-02-07 DIAGNOSIS — C61 Malignant neoplasm of prostate: Secondary | ICD-10-CM | POA: Diagnosis not present

## 2020-02-07 DIAGNOSIS — E291 Testicular hypofunction: Secondary | ICD-10-CM | POA: Diagnosis not present

## 2020-02-07 DIAGNOSIS — G933 Postviral fatigue syndrome: Secondary | ICD-10-CM | POA: Diagnosis not present

## 2020-02-07 DIAGNOSIS — I080 Rheumatic disorders of both mitral and aortic valves: Secondary | ICD-10-CM | POA: Diagnosis not present

## 2020-02-07 DIAGNOSIS — E7800 Pure hypercholesterolemia, unspecified: Secondary | ICD-10-CM | POA: Diagnosis not present

## 2020-02-07 DIAGNOSIS — M159 Polyosteoarthritis, unspecified: Secondary | ICD-10-CM | POA: Diagnosis not present

## 2020-02-07 DIAGNOSIS — I708 Atherosclerosis of other arteries: Secondary | ICD-10-CM | POA: Diagnosis not present

## 2020-02-07 DIAGNOSIS — N183 Chronic kidney disease, stage 3 unspecified: Secondary | ICD-10-CM | POA: Diagnosis not present

## 2020-02-07 DIAGNOSIS — E119 Type 2 diabetes mellitus without complications: Secondary | ICD-10-CM | POA: Diagnosis not present

## 2020-02-07 DIAGNOSIS — I119 Hypertensive heart disease without heart failure: Secondary | ICD-10-CM | POA: Diagnosis not present

## 2020-02-07 DIAGNOSIS — Z79899 Other long term (current) drug therapy: Secondary | ICD-10-CM | POA: Diagnosis not present

## 2020-04-08 DIAGNOSIS — M25551 Pain in right hip: Secondary | ICD-10-CM | POA: Diagnosis not present

## 2020-04-17 DIAGNOSIS — Z79899 Other long term (current) drug therapy: Secondary | ICD-10-CM | POA: Diagnosis not present

## 2020-04-17 DIAGNOSIS — I350 Nonrheumatic aortic (valve) stenosis: Secondary | ICD-10-CM | POA: Diagnosis not present

## 2020-04-17 DIAGNOSIS — G933 Postviral fatigue syndrome: Secondary | ICD-10-CM | POA: Diagnosis not present

## 2020-04-17 DIAGNOSIS — C61 Malignant neoplasm of prostate: Secondary | ICD-10-CM | POA: Diagnosis not present

## 2020-04-17 DIAGNOSIS — E7800 Pure hypercholesterolemia, unspecified: Secondary | ICD-10-CM | POA: Diagnosis not present

## 2020-04-17 DIAGNOSIS — M159 Polyosteoarthritis, unspecified: Secondary | ICD-10-CM | POA: Diagnosis not present

## 2020-04-17 DIAGNOSIS — E119 Type 2 diabetes mellitus without complications: Secondary | ICD-10-CM | POA: Diagnosis not present

## 2020-04-17 DIAGNOSIS — K921 Melena: Secondary | ICD-10-CM | POA: Diagnosis not present

## 2020-04-17 DIAGNOSIS — I119 Hypertensive heart disease without heart failure: Secondary | ICD-10-CM | POA: Diagnosis not present

## 2020-04-17 DIAGNOSIS — N1831 Chronic kidney disease, stage 3a: Secondary | ICD-10-CM | POA: Diagnosis not present

## 2020-04-17 DIAGNOSIS — E291 Testicular hypofunction: Secondary | ICD-10-CM | POA: Diagnosis not present

## 2020-04-17 DIAGNOSIS — I708 Atherosclerosis of other arteries: Secondary | ICD-10-CM | POA: Diagnosis not present

## 2020-04-22 DIAGNOSIS — R195 Other fecal abnormalities: Secondary | ICD-10-CM | POA: Diagnosis not present

## 2020-04-22 DIAGNOSIS — K625 Hemorrhage of anus and rectum: Secondary | ICD-10-CM | POA: Diagnosis not present

## 2020-04-24 ENCOUNTER — Telehealth: Payer: Self-pay | Admitting: *Deleted

## 2020-04-24 NOTE — Telephone Encounter (Signed)
[name] 79 year old male is requesting a colonoscopy.  He was last seen in clinic by you on 08/24/2019.  He was doing well at that time.  He reported occasional palpitations.  He reported having a cardiac event monitor done at the [place].  That showed occasional PACs, no A. fib, a flutter.  He was asymptomatic with a brief PSVT.  There were no complaints that correlated with palpitations.  He denied angina, dyspnea on exertion, orthopnea, PND, syncope, claudication, fever or chills.  His PMH also includes CKD stage III, anxiety, back pain, coronary artery disease status post SVG-LCx at the time of AVR 0/35, diastolic CHF, first-degree heart block, palpitations, HLD, HTN, and prediabetes.  May his aspirin be held prior to his colonoscopy?  Thank you for your help.  Please direct response to CV DIV preop for.  Jossie Ng. Parv Manthey NP-C    04/24/2020, 2:13 PM [address] Office [phone] Fax [phone]

## 2020-04-24 NOTE — Telephone Encounter (Signed)
Okay to hold ASA for 5 days prior

## 2020-04-24 NOTE — Telephone Encounter (Signed)
   [facility] Pre-operative Risk Assessment    HEARTCARE STAFF: - Please ensure there is not already an duplicate clearance open for this procedure. - Under Visit Info/Reason for Call, type in Other and utilize the format Clearance MM/DD/YY or Clearance TBD. Do not use dashes or single digits. - If request is for dental extraction, please clarify the # of teeth to be extracted.  Request for surgical clearance:  1. What type of surgery is being performed? COLONOSCOPY   2. When is this surgery scheduled? 05/22/20   3. What type of clearance is required (medical clearance vs. Pharmacy clearance to hold med vs. Both)? MEDICAL  4. Are there any medications that need to be held prior to surgery and how long? ASA    5. Practice name and name of physician performing surgery? [facility]; DR. Evette Doffing SCHO[facility]   6. What is the office phone number? [phone]   7.   What is the office fax number? [phone]  8.   Anesthesia type (None, local, MAC, general) ? NOT LISTED   Julaine Hua 04/24/2020, 1:48 PM  _________________________________________________________________   (provider comments below)

## 2020-04-24 NOTE — Telephone Encounter (Signed)
   Primary Cardiologist: [name], MD  Chart reviewed as part of pre-operative protocol coverage. Given past medical history and time since last visit, based on ACC/AHA guidelines, [name] would be at acceptable risk for the planned procedure without further cardiovascular testing.   Patient was advised that if he develops new symptoms prior to surgery to contact our office to arrange a follow-up appointment.  He verbalized understanding.  He may hold his aspirin for 5 days prior to his procedure.  Please resume as soon as hemostasis is achieved.  I will route this recommendation to the requesting party via Epic fax function and remove from pre-op pool.  Please call with questions.

## 2020-05-22 DIAGNOSIS — K5730 Diverticulosis of large intestine without perforation or abscess without bleeding: Secondary | ICD-10-CM | POA: Diagnosis not present

## 2020-05-22 DIAGNOSIS — K640 First degree hemorrhoids: Secondary | ICD-10-CM | POA: Diagnosis not present

## 2020-05-22 DIAGNOSIS — K625 Hemorrhage of anus and rectum: Secondary | ICD-10-CM | POA: Diagnosis not present

## 2020-06-02 DIAGNOSIS — E7800 Pure hypercholesterolemia, unspecified: Secondary | ICD-10-CM | POA: Diagnosis not present

## 2020-06-02 DIAGNOSIS — I080 Rheumatic disorders of both mitral and aortic valves: Secondary | ICD-10-CM | POA: Diagnosis not present

## 2020-06-02 DIAGNOSIS — K921 Melena: Secondary | ICD-10-CM | POA: Diagnosis not present

## 2020-06-02 DIAGNOSIS — I708 Atherosclerosis of other arteries: Secondary | ICD-10-CM | POA: Diagnosis not present

## 2020-06-02 DIAGNOSIS — N1831 Chronic kidney disease, stage 3a: Secondary | ICD-10-CM | POA: Diagnosis not present

## 2020-06-02 DIAGNOSIS — C61 Malignant neoplasm of prostate: Secondary | ICD-10-CM | POA: Diagnosis not present

## 2020-06-02 DIAGNOSIS — Z79899 Other long term (current) drug therapy: Secondary | ICD-10-CM | POA: Diagnosis not present

## 2020-06-02 DIAGNOSIS — I119 Hypertensive heart disease without heart failure: Secondary | ICD-10-CM | POA: Diagnosis not present

## 2020-06-02 DIAGNOSIS — E119 Type 2 diabetes mellitus without complications: Secondary | ICD-10-CM | POA: Diagnosis not present

## 2020-06-02 DIAGNOSIS — I350 Nonrheumatic aortic (valve) stenosis: Secondary | ICD-10-CM | POA: Diagnosis not present

## 2020-06-02 DIAGNOSIS — E291 Testicular hypofunction: Secondary | ICD-10-CM | POA: Diagnosis not present

## 2020-06-02 DIAGNOSIS — G933 Postviral fatigue syndrome: Secondary | ICD-10-CM | POA: Diagnosis not present

## 2020-07-28 DIAGNOSIS — Z8546 Personal history of malignant neoplasm of prostate: Secondary | ICD-10-CM | POA: Diagnosis not present

## 2020-08-04 DIAGNOSIS — R351 Nocturia: Secondary | ICD-10-CM | POA: Diagnosis not present

## 2020-08-04 DIAGNOSIS — N5201 Erectile dysfunction due to arterial insufficiency: Secondary | ICD-10-CM | POA: Diagnosis not present

## 2020-08-04 DIAGNOSIS — N401 Enlarged prostate with lower urinary tract symptoms: Secondary | ICD-10-CM | POA: Diagnosis not present

## 2020-08-22 ENCOUNTER — Other Ambulatory Visit (HOSPITAL_COMMUNITY): Payer: Medicare Other

## 2020-08-22 ENCOUNTER — Encounter: Payer: Self-pay | Admitting: Cardiology

## 2020-08-22 NOTE — Progress Notes (Unsigned)
Patient ID: [name], male   DOB: Jan 31, 1941, 80 y.o.   MRN: [id number]  Verified appointment "no show" status with Meeka at 9:28am.

## 2020-09-23 DIAGNOSIS — R291 Meningismus: Secondary | ICD-10-CM | POA: Diagnosis not present

## 2020-09-23 DIAGNOSIS — E119 Type 2 diabetes mellitus without complications: Secondary | ICD-10-CM | POA: Diagnosis not present

## 2020-09-23 DIAGNOSIS — I440 Atrioventricular block, first degree: Secondary | ICD-10-CM | POA: Diagnosis not present

## 2020-09-23 DIAGNOSIS — I119 Hypertensive heart disease without heart failure: Secondary | ICD-10-CM | POA: Diagnosis not present

## 2020-09-23 DIAGNOSIS — N1831 Chronic kidney disease, stage 3a: Secondary | ICD-10-CM | POA: Diagnosis not present

## 2020-09-23 DIAGNOSIS — C61 Malignant neoplasm of prostate: Secondary | ICD-10-CM | POA: Diagnosis not present

## 2020-09-23 DIAGNOSIS — M4310 Spondylolisthesis, site unspecified: Secondary | ICD-10-CM | POA: Diagnosis not present

## 2020-09-23 DIAGNOSIS — E7800 Pure hypercholesterolemia, unspecified: Secondary | ICD-10-CM | POA: Diagnosis not present

## 2020-09-23 DIAGNOSIS — Z79899 Other long term (current) drug therapy: Secondary | ICD-10-CM | POA: Diagnosis not present

## 2020-09-23 DIAGNOSIS — M1990 Unspecified osteoarthritis, unspecified site: Secondary | ICD-10-CM | POA: Diagnosis not present

## 2020-09-23 DIAGNOSIS — R5383 Other fatigue: Secondary | ICD-10-CM | POA: Diagnosis not present

## 2020-09-23 DIAGNOSIS — I350 Nonrheumatic aortic (valve) stenosis: Secondary | ICD-10-CM | POA: Diagnosis not present

## 2020-09-26 ENCOUNTER — Other Ambulatory Visit: Payer: Self-pay

## 2020-09-26 ENCOUNTER — Ambulatory Visit (HOSPITAL_COMMUNITY): Payer: Medicare Other | Attending: Cardiology

## 2020-09-26 DIAGNOSIS — Z952 Presence of prosthetic heart valve: Secondary | ICD-10-CM | POA: Insufficient documentation

## 2020-09-26 LAB — ECHOCARDIOGRAM COMPLETE
AR max vel: 1.95 cm2
AV Area VTI: 2.51 cm2
AV Area mean vel: 2.06 cm2
AV Mean grad: 6 mmHg
AV Peak grad: 13.4 mmHg
Ao pk vel: 1.83 m/s
Area-P 1/2: 3.42 cm2
Calc EF: 61.2 %
S' Lateral: 2.7 cm
Single Plane A2C EF: 59.3 %
Single Plane A4C EF: 62.7 %

## 2020-09-26 NOTE — Progress Notes (Signed)
Patient ID: [name], male   DOB: [birth year], 80 y.o.   MRN: [id number]  Echocardiogram 2D Echocardiogram has been performed.  Jennette Dubin 09/26/20

## 2020-10-27 DIAGNOSIS — M545 Low back pain, unspecified: Secondary | ICD-10-CM | POA: Diagnosis not present

## 2020-11-06 NOTE — Progress Notes (Signed)
Cardiology Office Note:    Date:  11/07/2020   ID:  [name], DOB [birth year], MRN [id number]  PCP:  [name], MD  Cardiologist:  [name], MD   Referring MD: [name], MD   Chief Complaint  Patient presents with   Cardiac Valve Problem   Hyperlipidemia   Coronary Artery Disease    History of Present Illness:    [name] is a 80 y.o. male with a hx of  bioprosthetic aortic valve replacement placed in 2013, hypertension, CAD with CABG 2013, bilateral carotid bruits, and hyperlipidemia.  Postop bilateral pulmonary emboli.  He is doing well.  He is not having cardiac events.  He denies orthopnea and PND.  No transient neurological episodes.  He denies lower extremity swelling.  No angina.   Past Medical History:  Diagnosis Date   Anxiety    Arthritis    shoulders , back   Back pain    buldging disc   CKD (chronic kidney disease), stage III [facility])    Coronary artery disease cardiologist-- [name]   a. s/p SVG-LCx at time of AVR on 09-02-2011.   Diastolic CHF, chronic ([place])    followed by [name]   First degree heart block    Hemorrhoids    History of palpitations    per pt 05/ 2020 notified his cardiologsit, [name], had an episode of palpitations;  [name] ordered Zio patch montior results 10-17-2018, per [name] note no signifiant abnormality (01-08-2019 pt stated has not had any palpitations since that one time)   History of pulmonary embolus (PE)    09-11-2011 post op surgery CABG/ AVR on 09-02-2011----bilateral PE and right pleural effusionper pt completed coumadin   Hyperlipidemia    takes Trilipix daily   Hypertension    Nocturia    Pre-diabetes    Prostate cancer [facility]) urologist-- dr eskridge/  dr [name]   dx 07-24-2018,  Stage T1c, Gleason 3+4   S/P aortic valve replacement    S/P aortic valve replacement with prosthetic valve 09/02/2018   bioprosthetic   Wears dentures    full upper and  partial lower   Wears glasses     Past Surgical History:  Procedure Laterality Date   AORTIC VALVE REPLACEMENT (AVR)/CORONARY ARTERY BYPASS GRAFTING (CABG)  09-02-2011   dr [name]   SVG to LCx and Bioprosthetic aortic valve   BLEPHAROPLASTY Right 2009 approx.   CARDIAC CATHETERIZATION  08-19-2018   [name]   severe single vessel LCx and severe aortic valve stenosis, normal LVF   COLONOSCOPY     2007/2012   CYST REMOVAL NECK  1970s   benign per pt   ESOPHAGOGASTRODUODENOSCOPY     RADIOACTIVE SEED IMPLANT N/A 01/09/2019   Procedure: RADIOACTIVE SEED IMPLANT/BRACHYTHERAPY IMPLANT;  Surgeon: [name], MD;  Location: [facility];  Service: Urology;  Laterality: N/A;  78 seeds implanted cysto per Dr [name], no seeds found    Current Medications: Current Meds  Medication Sig   amoxicillin (AMOXIL) 500 MG capsule Take 2,000 mg by mouth. For dental procedure   aspirin EC 81 MG tablet Take 81 mg by mouth daily.   Azilsartan Medoxomil 80 MG TABS Take 40 mg by mouth daily.    chlorthalidone (HYGROTON) 25 MG tablet Take 0.5 tablets (12.5 mg total) by mouth every Monday, Wednesday, and Friday.   Choline Fenofibrate (FENOFIBRIC ACID) 45 MG CPDR Take 45 mg by mouth  at bedtime.   meclizine (ANTIVERT) 12.5 MG tablet Take 12.5 mg by mouth 2 (two) times daily.   Multiple Vitamin (MULTIVITAMIN) tablet Take 1 tablet by mouth daily.   Omega-3 Fatty Acids (OMEGA-3 FISH OIL PO) Take 1 capsule by mouth daily.   solifenacin (VESICARE) 5 MG tablet Take 5 mg by mouth daily.   Tamsulosin HCl (FLOMAX) 0.4 MG CAPS Take 0.4 mg by mouth daily after supper.    XIIDRA 5 % SOLN Place 1 drop into both eyes 2 (two) times daily.     Allergies:   Codeine   Social History   Socioeconomic History   Marital status: Married    Spouse name: Not on file   Number of children: Not on file   Years of education: Not on file   Highest education level: Not on file  Occupational History    Occupation: retired  Tobacco Use   Smoking status: Never   Smokeless tobacco: Never  Vaping Use   Vaping Use: Never used  Substance and Sexual Activity   Alcohol use: Yes    Comment: wine occasional   Drug use: No   Sexual activity: Yes  Other Topics Concern   Not on file  Social History Narrative   Not on file   Social Determinants of Health   Financial Resource Strain: Not on file  Food Insecurity: Not on file  Transportation Needs: Not on file  Physical Activity: Not on file  Stress: Not on file  Social Connections: Not on file     Family History: The patient's family history includes Cancer in his mother; Hypertension in his father and sister; Ovarian cancer in his sister. There is no history of Anesthesia problems, Hypotension, Malignant hyperthermia, or Pseudochol deficiency.  ROS:   Please see the history of present illness.    Back trouble is major concern.  He has seed implants for prostate cancer.  All other systems reviewed and are negative.  EKGs/Labs/Other Studies Reviewed:    The following studies were reviewed today:  2D Doppler echocardiogram 09/26/2020 IMPRESSIONS     1. Left ventricular ejection fraction, by estimation, is 60 to 65%. Left  ventricular ejection fraction by 2D MOD biplane is 61.2 %. The left  ventricle has normal function. The left ventricle has no regional wall  motion abnormalities. There is moderate  concentric left ventricular hypertrophy. Left ventricular diastolic  parameters are indeterminate.   2. Right ventricular systolic function is normal. The right ventricular  size is normal. There is normal pulmonary artery systolic pressure.   3. The mitral valve is abnormal. Mild eccentric mitral valve  regurgitation secondary to prolapse.   4. The aortic valve has been repaired/replaced. Aortic valve  regurgitation is trivial. There is a bioprosthetic valve present in the  aortic position. Procedure Date: 09/02/2011. Aortic valve  area, by VTI  measures 2.51 cm. Aortic valve mean gradient  measures 6.0 mmHg. Aortic valve Vmax measures 1.83 m/s. Aortic valve  acceleration time measures 66 msec. DVI of 0.55; stable bioprosthetic  valve parameters.   5. Aortic dilatation noted. There is mild dilatation of the ascending  aorta, measuring 41 mm, though within normal limits for age and BSA.   Comparison(s): A prior study was performed on 06/09/16. Prior images  reviewed side by side. Similar valve parameters from prior.   EKG:  EKG PR interval is 288 ms.  Nonspecific ST abnormality is noted.  EKG is otherwise normal.  When compared to the tracing from December 2020,  PR is slightly longer but otherwise no change.  Recent Labs: No results found for requested labs within last 8760 hours.  Recent Lipid Panel    Component Value Date/Time   CHOL 105 09/05/2011 0544   TRIG 131 09/05/2011 0544   HDL 23 (L) 09/05/2011 0544   CHOLHDL 4.6 09/05/2011 0544   VLDL 26 09/05/2011 0544   LDLCALC 56 09/05/2011 0544    Physical Exam:    VS:  BP 132/80   Pulse 79   Ht 5\' 10"  (1.778 m)   Wt 185 lb 3.2 oz (84 kg)   SpO2 96%   BMI 26.57 kg/m     Wt Readings from Last 3 Encounters:  11/07/20 185 lb 3.2 oz (84 kg)  08/24/19 184 lb 9.6 oz (83.7 kg)  05/08/19 188 lb (85.3 kg)     GEN: Appears healthy. No acute distress HEENT: Normal NECK: No JVD. LYMPHATICS: No lymphadenopathy CARDIAC: 2/6 to 3/6 systolic crescendo decrescendo murmur. RRR no gallop, or edema. VASCULAR:  Normal Pulses. No bruits. RESPIRATORY:  Clear to auscultation without rales, wheezing or rhonchi  ABDOMEN: Soft, non-tender, non-distended, No pulsatile mass, MUSCULOSKELETAL: No deformity  SKIN: Warm and dry NEUROLOGIC:  Alert and oriented x 3 PSYCHIATRIC:  Normal affect   ASSESSMENT:    1. S/P AVR   2. Chronic diastolic heart failure ([place])   3. Essential hypertension   4. Atherosclerosis of native coronary artery of native heart without angina  pectoris   5. Hyperlipidemia LDL goal <70    PLAN:    In order of problems listed above:  Normally functioning aortic prosthesis.  Endocarditis prophylaxis discussed. No evidence of volume overload. Continue Hygroton on for blood pressure control, tamsulosin, and azilsartan. Had bypass at the time of valve replacement.  Not having angina.  Secondary prevention discussed. Not currently on a statin and has apparently had difficulty with side effects.  Statins are managed by Dr. [name].  Patient is currently on fenofibrate.  LDL is 105.  This was done in May.  Target should be less than 70.  Recommend low-dose statin therapy, perhaps rosuvastatin 5 mg Monday, Wednesday, and Friday.  If unable to tolerate very low-dose statin therapy, may need to consider Bempedoic acid one of the other agents.   Overall education and awareness concerning primary/secondary risk prevention was discussed in detail: LDL less than 70, hemoglobin A1c less than 7, blood pressure target less than 130/80 mmHg, >150 minutes of moderate aerobic activity per week, avoidance of smoking, weight control (via diet and exercise), and continued surveillance/management of/for obstructive sleep apnea.    Medication Adjustments/Labs and Tests Ordered: Current medicines are reviewed at length with the patient today.  Concerns regarding medicines are outlined above.  Orders Placed This Encounter  Procedures   EKG 12-Lead   No orders of the defined types were placed in this encounter.   Patient Instructions  Medication Instructions:  Your physician recommends that you continue on your current medications as directed. Please refer to the Current Medication list given to you today.  *If you need a refill on your cardiac medications before your next appointment, please call your pharmacy*   Lab Work: None If you have labs (blood work) drawn today and your tests are completely normal, you will receive your results  only by: [place] (if you have MyChart) OR A paper copy in the mail If you have any lab test that is abnormal or we need to change your treatment, we will call  you to review the results.   Testing/Procedures: None   Follow-Up: At [facility], you and your health needs are our priority.  As part of our continuing mission to provide you with exceptional heart care, we have created designated Provider Care Teams.  These Care Teams include your primary Cardiologist (physician) and Advanced Practice Providers (APPs -  Physician Assistants and Nurse Practitioners) who all work together to provide you with the care you need, when you need it.  We recommend signing up for the patient portal called "MyChart".  Sign up information is provided on this After Visit Summary.  MyChart is used to connect with patients for Virtual Visits (Telemedicine).  Patients are able to view lab/test results, encounter notes, upcoming appointments, etc.  Non-urgent messages can be sent to your provider as well.   To learn more about what you can do with MyChart, go to [email or web address].    Your next appointment:   1 year(s)  The format for your next appointment:   In Person  Provider:   You may see [name], MD or one of the following Advanced Practice Providers on your designated Care Team:   Kathyrn Drown, NP   Other Instructions     Signed, [name], MD  11/07/2020 9:57 AM    [place]

## 2020-11-07 ENCOUNTER — Ambulatory Visit (INDEPENDENT_AMBULATORY_CARE_PROVIDER_SITE_OTHER): Payer: Medicare Other | Admitting: Interventional Cardiology

## 2020-11-07 ENCOUNTER — Other Ambulatory Visit: Payer: Self-pay

## 2020-11-07 ENCOUNTER — Encounter: Payer: Self-pay | Admitting: Interventional Cardiology

## 2020-11-07 VITALS — BP 132/80 | HR 79 | Ht 70.0 in | Wt 185.2 lb

## 2020-11-07 DIAGNOSIS — E785 Hyperlipidemia, unspecified: Secondary | ICD-10-CM

## 2020-11-07 DIAGNOSIS — I2510 Atherosclerotic heart disease of native coronary artery without angina pectoris: Secondary | ICD-10-CM

## 2020-11-07 DIAGNOSIS — I10 Essential (primary) hypertension: Secondary | ICD-10-CM | POA: Diagnosis not present

## 2020-11-07 DIAGNOSIS — Z952 Presence of prosthetic heart valve: Secondary | ICD-10-CM | POA: Diagnosis not present

## 2020-11-07 DIAGNOSIS — I5032 Chronic diastolic (congestive) heart failure: Secondary | ICD-10-CM

## 2020-11-07 NOTE — Patient Instructions (Signed)

## 2020-11-11 DIAGNOSIS — Z79899 Other long term (current) drug therapy: Secondary | ICD-10-CM | POA: Diagnosis not present

## 2020-11-11 DIAGNOSIS — I119 Hypertensive heart disease without heart failure: Secondary | ICD-10-CM | POA: Diagnosis not present

## 2020-11-11 DIAGNOSIS — E291 Testicular hypofunction: Secondary | ICD-10-CM | POA: Diagnosis not present

## 2020-11-11 DIAGNOSIS — K625 Hemorrhage of anus and rectum: Secondary | ICD-10-CM | POA: Diagnosis not present

## 2020-11-11 DIAGNOSIS — M4310 Spondylolisthesis, site unspecified: Secondary | ICD-10-CM | POA: Diagnosis not present

## 2020-11-11 DIAGNOSIS — C61 Malignant neoplasm of prostate: Secondary | ICD-10-CM | POA: Diagnosis not present

## 2020-11-11 DIAGNOSIS — K648 Other hemorrhoids: Secondary | ICD-10-CM | POA: Diagnosis not present

## 2020-11-11 DIAGNOSIS — G933 Postviral fatigue syndrome: Secondary | ICD-10-CM | POA: Diagnosis not present

## 2020-11-11 DIAGNOSIS — N1831 Chronic kidney disease, stage 3a: Secondary | ICD-10-CM | POA: Diagnosis not present

## 2020-11-11 DIAGNOSIS — E7800 Pure hypercholesterolemia, unspecified: Secondary | ICD-10-CM | POA: Diagnosis not present

## 2020-11-11 DIAGNOSIS — E1165 Type 2 diabetes mellitus with hyperglycemia: Secondary | ICD-10-CM | POA: Diagnosis not present

## 2020-11-11 DIAGNOSIS — I350 Nonrheumatic aortic (valve) stenosis: Secondary | ICD-10-CM | POA: Diagnosis not present

## 2020-11-17 DIAGNOSIS — C61 Malignant neoplasm of prostate: Secondary | ICD-10-CM | POA: Diagnosis not present

## 2020-11-17 DIAGNOSIS — I350 Nonrheumatic aortic (valve) stenosis: Secondary | ICD-10-CM | POA: Diagnosis not present

## 2020-11-17 DIAGNOSIS — E7800 Pure hypercholesterolemia, unspecified: Secondary | ICD-10-CM | POA: Diagnosis not present

## 2020-11-17 DIAGNOSIS — G933 Postviral fatigue syndrome: Secondary | ICD-10-CM | POA: Diagnosis not present

## 2020-11-17 DIAGNOSIS — E1165 Type 2 diabetes mellitus with hyperglycemia: Secondary | ICD-10-CM | POA: Diagnosis not present

## 2020-11-17 DIAGNOSIS — N1831 Chronic kidney disease, stage 3a: Secondary | ICD-10-CM | POA: Diagnosis not present

## 2020-11-17 DIAGNOSIS — E291 Testicular hypofunction: Secondary | ICD-10-CM | POA: Diagnosis not present

## 2020-11-17 DIAGNOSIS — Z79899 Other long term (current) drug therapy: Secondary | ICD-10-CM | POA: Diagnosis not present

## 2020-11-17 DIAGNOSIS — I119 Hypertensive heart disease without heart failure: Secondary | ICD-10-CM | POA: Diagnosis not present

## 2020-11-17 DIAGNOSIS — M4310 Spondylolisthesis, site unspecified: Secondary | ICD-10-CM | POA: Diagnosis not present

## 2020-11-17 DIAGNOSIS — K648 Other hemorrhoids: Secondary | ICD-10-CM | POA: Diagnosis not present

## 2020-11-17 DIAGNOSIS — K625 Hemorrhage of anus and rectum: Secondary | ICD-10-CM | POA: Diagnosis not present

## 2020-11-20 DIAGNOSIS — M5416 Radiculopathy, lumbar region: Secondary | ICD-10-CM | POA: Diagnosis not present

## 2020-11-24 DIAGNOSIS — M545 Low back pain, unspecified: Secondary | ICD-10-CM | POA: Insufficient documentation

## 2020-11-26 DIAGNOSIS — M545 Low back pain, unspecified: Secondary | ICD-10-CM | POA: Diagnosis not present

## 2020-12-03 DIAGNOSIS — M545 Low back pain, unspecified: Secondary | ICD-10-CM | POA: Diagnosis not present

## 2020-12-08 DIAGNOSIS — M545 Low back pain, unspecified: Secondary | ICD-10-CM | POA: Diagnosis not present

## 2020-12-18 DIAGNOSIS — M545 Low back pain, unspecified: Secondary | ICD-10-CM | POA: Diagnosis not present

## 2020-12-23 DIAGNOSIS — K627 Radiation proctitis: Secondary | ICD-10-CM | POA: Diagnosis not present

## 2021-01-26 DIAGNOSIS — Z8546 Personal history of malignant neoplasm of prostate: Secondary | ICD-10-CM | POA: Diagnosis not present

## 2021-02-02 DIAGNOSIS — N35011 Post-traumatic bulbous urethral stricture: Secondary | ICD-10-CM | POA: Diagnosis not present

## 2021-02-02 DIAGNOSIS — Z8546 Personal history of malignant neoplasm of prostate: Secondary | ICD-10-CM | POA: Diagnosis not present

## 2021-02-09 ENCOUNTER — Other Ambulatory Visit: Payer: Self-pay | Admitting: Gastroenterology

## 2021-02-09 DIAGNOSIS — K625 Hemorrhage of anus and rectum: Secondary | ICD-10-CM | POA: Diagnosis not present

## 2021-02-09 DIAGNOSIS — K627 Radiation proctitis: Secondary | ICD-10-CM | POA: Diagnosis not present

## 2021-02-19 DIAGNOSIS — Z23 Encounter for immunization: Secondary | ICD-10-CM | POA: Diagnosis not present

## 2021-02-20 ENCOUNTER — Encounter (HOSPITAL_COMMUNITY): Payer: Self-pay | Admitting: Gastroenterology

## 2021-02-23 DIAGNOSIS — I350 Nonrheumatic aortic (valve) stenosis: Secondary | ICD-10-CM | POA: Diagnosis not present

## 2021-02-23 DIAGNOSIS — I119 Hypertensive heart disease without heart failure: Secondary | ICD-10-CM | POA: Diagnosis not present

## 2021-02-23 DIAGNOSIS — R5383 Other fatigue: Secondary | ICD-10-CM | POA: Diagnosis not present

## 2021-02-23 DIAGNOSIS — M4310 Spondylolisthesis, site unspecified: Secondary | ICD-10-CM | POA: Diagnosis not present

## 2021-02-23 DIAGNOSIS — Z79899 Other long term (current) drug therapy: Secondary | ICD-10-CM | POA: Diagnosis not present

## 2021-02-23 DIAGNOSIS — K648 Other hemorrhoids: Secondary | ICD-10-CM | POA: Diagnosis not present

## 2021-02-23 DIAGNOSIS — E291 Testicular hypofunction: Secondary | ICD-10-CM | POA: Diagnosis not present

## 2021-02-23 DIAGNOSIS — L299 Pruritus, unspecified: Secondary | ICD-10-CM | POA: Diagnosis not present

## 2021-02-23 DIAGNOSIS — C61 Malignant neoplasm of prostate: Secondary | ICD-10-CM | POA: Diagnosis not present

## 2021-02-23 DIAGNOSIS — E7800 Pure hypercholesterolemia, unspecified: Secondary | ICD-10-CM | POA: Diagnosis not present

## 2021-02-23 DIAGNOSIS — N1831 Chronic kidney disease, stage 3a: Secondary | ICD-10-CM | POA: Diagnosis not present

## 2021-02-23 DIAGNOSIS — E119 Type 2 diabetes mellitus without complications: Secondary | ICD-10-CM | POA: Diagnosis not present

## 2021-02-24 ENCOUNTER — Other Ambulatory Visit: Payer: Self-pay | Admitting: Gastroenterology

## 2021-03-03 ENCOUNTER — Ambulatory Visit (HOSPITAL_COMMUNITY)
Admission: RE | Admit: 2021-03-03 | Discharge: 2021-03-03 | Disposition: A | Discharge status: Home / Self Care | Payer: Medicare Other | Attending: Gastroenterology | Admitting: Gastroenterology

## 2021-03-03 ENCOUNTER — Ambulatory Visit (HOSPITAL_COMMUNITY): Payer: Medicare Other | Admitting: Anesthesiology

## 2021-03-03 ENCOUNTER — Other Ambulatory Visit: Payer: Self-pay

## 2021-03-03 ENCOUNTER — Encounter (HOSPITAL_COMMUNITY): Payer: Self-pay | Admitting: Gastroenterology

## 2021-03-03 ENCOUNTER — Encounter (HOSPITAL_COMMUNITY)
Admission: RE | Disposition: A | Discharge status: Home / Self Care | Payer: Self-pay | Source: Home / Self Care | Attending: Gastroenterology

## 2021-03-03 DIAGNOSIS — K627 Radiation proctitis: Secondary | ICD-10-CM | POA: Diagnosis not present

## 2021-03-03 DIAGNOSIS — K625 Hemorrhage of anus and rectum: Secondary | ICD-10-CM | POA: Diagnosis not present

## 2021-03-03 DIAGNOSIS — K640 First degree hemorrhoids: Secondary | ICD-10-CM | POA: Insufficient documentation

## 2021-03-03 DIAGNOSIS — Z885 Allergy status to narcotic agent status: Secondary | ICD-10-CM | POA: Diagnosis not present

## 2021-03-03 DIAGNOSIS — Z79899 Other long term (current) drug therapy: Secondary | ICD-10-CM | POA: Insufficient documentation

## 2021-03-03 DIAGNOSIS — Z7982 Long term (current) use of aspirin: Secondary | ICD-10-CM | POA: Diagnosis not present

## 2021-03-03 DIAGNOSIS — I130 Hypertensive heart and chronic kidney disease with heart failure and stage 1 through stage 4 chronic kidney disease, or unspecified chronic kidney disease: Secondary | ICD-10-CM | POA: Diagnosis not present

## 2021-03-03 DIAGNOSIS — N183 Chronic kidney disease, stage 3 unspecified: Secondary | ICD-10-CM | POA: Diagnosis not present

## 2021-03-03 DIAGNOSIS — I5032 Chronic diastolic (congestive) heart failure: Secondary | ICD-10-CM | POA: Diagnosis not present

## 2021-03-03 HISTORY — PX: FLEXIBLE SIGMOIDOSCOPY: SHX5431

## 2021-03-03 HISTORY — PX: HOT HEMOSTASIS: SHX5433

## 2021-03-03 SURGERY — SIGMOIDOSCOPY, FLEXIBLE
Anesthesia: Monitor Anesthesia Care

## 2021-03-03 MED ORDER — GLYCOPYRROLATE 0.2 MG/ML IJ SOLN
INTRAMUSCULAR | Status: DC | PRN
  Administered 2021-03-03: .2 mg via INTRAVENOUS

## 2021-03-03 MED ORDER — SODIUM CHLORIDE 0.9 % IV SOLN: INTRAVENOUS | Status: DC

## 2021-03-03 MED ORDER — EPHEDRINE SULFATE 50 MG/ML IJ SOLN
INTRAMUSCULAR | Status: DC | PRN
  Administered 2021-03-03: 10 mg via INTRAVENOUS

## 2021-03-03 MED ORDER — PROPOFOL 10 MG/ML IV BOLUS
INTRAVENOUS | Status: AC
  Filled 2021-03-03: qty 20

## 2021-03-03 MED ORDER — PROPOFOL 500 MG/50ML IV EMUL
INTRAVENOUS | Status: AC
  Filled 2021-03-03: qty 50

## 2021-03-03 MED ORDER — LACTATED RINGERS IV SOLN
INTRAVENOUS | Status: AC | PRN
  Administered 2021-03-03: 1000 mL via INTRAVENOUS

## 2021-03-03 MED ORDER — PROPOFOL 1000 MG/100ML IV EMUL
INTRAVENOUS | Status: AC
  Filled 2021-03-03: qty 100

## 2021-03-03 MED ORDER — PROPOFOL 500 MG/50ML IV EMUL
INTRAVENOUS | Status: DC | PRN
Start: 2021-03-03 — End: 2021-03-03
  Administered 2021-03-03: 20 mg via INTRAVENOUS

## 2021-03-03 MED ORDER — PROPOFOL 500 MG/50ML IV EMUL
INTRAVENOUS | Status: DC | PRN
  Administered 2021-03-03: 135 ug/kg/min via INTRAVENOUS

## 2021-03-03 NOTE — Anesthesia Postprocedure Evaluation (Signed)
Anesthesia Post Note  Patient: [name]  Procedure(s) Performed: [place] (ARGON PLASMA COAGULATION/BICAP)     Patient location during evaluation: Endoscopy Anesthesia Type: MAC Level of consciousness: awake and alert Pain management: pain level controlled Vital Signs Assessment: post-procedure vital signs reviewed and stable Respiratory status: spontaneous breathing, nonlabored ventilation, respiratory function stable and patient connected to nasal cannula oxygen Cardiovascular status: stable and blood pressure returned to baseline Postop Assessment: no apparent nausea or vomiting Anesthetic complications: no   No notable events documented.  Last Vitals:  Vitals:   03/03/21 1350 03/03/21 1400  BP: (!) 110/55 (!) 106/56  Pulse: 67 65  Resp: 15 17  Temp:    SpO2: 100% 100%    Last Pain:  Vitals:   03/03/21 1400  TempSrc:   PainSc: 6                  Lattie Cervi P Stormy Sabol

## 2021-03-03 NOTE — H&P (Signed)
Date of Initial H&P: 02/09/21  History reviewed, patient examined, no change in status, stable for surgery.

## 2021-03-03 NOTE — Anesthesia Preprocedure Evaluation (Signed)
Anesthesia Evaluation  Patient identified by MRN, date of birth, ID band Patient awake    Reviewed: Allergy & Precautions, NPO status , Patient's Chart, lab work & pertinent test results  Airway Mallampati: II  TM Distance: >3 FB Neck ROM: Full    Dental  (+) Upper Dentures, Partial Lower   Pulmonary PE   Pulmonary exam normal        Cardiovascular hypertension, + CAD, + CABG and +CHF  + dysrhythmias Atrial Fibrillation  Rhythm:Regular Rate:Normal + Systolic Click S/p AVR 16/10   Neuro/Psych Anxiety negative neurological ROS     GI/Hepatic Neg liver ROS, Rectal bleeding   Endo/Other  negative endocrine ROS  Renal/GU CRFRenal disease  negative genitourinary   Musculoskeletal  (+) Arthritis , Osteoarthritis,    Abdominal (+)  Abdomen: soft.    Peds  Hematology negative hematology ROS (+)   Anesthesia Other Findings   Reproductive/Obstetrics                             Anesthesia Physical Anesthesia Plan  ASA: 3  Anesthesia Plan: MAC   Post-op Pain Management:    Induction: Intravenous  PONV Risk Score and Plan: 1 and Propofol infusion and Treatment may vary due to age or medical condition  Airway Management Planned: Simple Face Mask, Natural Airway and Nasal Cannula  Additional Equipment: None  Intra-op Plan:   Post-operative Plan:   Informed Consent: I have reviewed the patients History and Physical, chart, labs and discussed the procedure including the risks, benefits and alternatives for the proposed anesthesia with the patient or authorized representative who has indicated his/her understanding and acceptance.     Dental advisory given  Plan Discussed with: CRNA  Anesthesia Plan Comments: (Lab Results      Component                Value               Date                      WBC                      5.0                 04/02/2019                HGB                       13.5                04/02/2019                HCT                      41.6                04/02/2019                MCV                      89.5                04/02/2019                PLT  197                 04/02/2019           Lab Results      Component                Value               Date                      NA                       138                 04/02/2019                K                        3.6                 04/02/2019                CO2                      30                  04/02/2019                GLUCOSE                  97                  04/02/2019                BUN                      13                  04/02/2019                CREATININE               1.62 (H)            04/02/2019                CALCIUM                  9.5                 04/02/2019                GFRNONAA                 40 (L)              04/02/2019           ECHO 05/22: 1. Left ventricular ejection fraction, by estimation, is 60 to 65%. Left  ventricular ejection fraction by 2D MOD biplane is 61.2 %. The left  ventricle has normal function. The left ventricle has no regional wall  motion abnormalities. There is moderate  concentric left ventricular hypertrophy. Left ventricular diastolic  parameters are indeterminate.  2. Right ventricular systolic function is normal. The right ventricular  size is normal. There is normal pulmonary artery systolic pressure.  3. The mitral valve is abnormal. Mild eccentric mitral valve  regurgitation secondary to prolapse.  4.  The aortic valve has been repaired/replaced. Aortic valve  regurgitation is trivial. There is a bioprosthetic valve present in the  aortic position. Procedure Date: 09/02/2011. Aortic valve area, by VTI  measures 2.51 cm. Aortic valve mean gradient  measures 6.0 mmHg. Aortic valve Vmax measures 1.83 m/s. Aortic valve  acceleration time measures 66 msec. DVI of 0.55; stable bioprosthetic   valve parameters.  5. Aortic dilatation noted. There is mild dilatation of the ascending  aorta, measuring 41 mm, though within normal limits for age and BSA. )        Anesthesia Quick Evaluation

## 2021-03-03 NOTE — Interval H&P Note (Signed)
History and Physical Interval Note:  03/03/2021 1:03 PM  [name]  has presented today for surgery, with the diagnosis of Radiation proctitis/rectal bleeding.  The various methods of treatment have been discussed with the patient and family. After consideration of risks, benefits and other options for treatment, the patient has consented to  Procedure(s): FLEXIBLE SIGMOIDOSCOPY (N/A) HOT HEMOSTASIS (ARGON PLASMA COAGULATION/BICAP) (N/A) as a surgical intervention.  The patient's history has been reviewed, patient examined, no change in status, stable for surgery.  I have reviewed the patient's chart and labs.  Questions were answered to the patient's satisfaction.     Lear Ng

## 2021-03-03 NOTE — Transfer of Care (Signed)
Immediate Anesthesia Transfer of Care Note  Patient: [name]  Procedure(s) Performed: Procedure(s): FLEXIBLE SIGMOIDOSCOPY (N/A) HOT HEMOSTASIS (ARGON PLASMA COAGULATION/BICAP) (N/A)  Patient Location: PACU  Anesthesia Type:MAC  Level of Consciousness:  sedated, patient cooperative and responds to stimulation  Airway & Oxygen Therapy:Patient Spontanous Breathing and Patient connected to face mask oxgen  Post-op Assessment:  Report given to PACU RN and Post -op Vital signs reviewed and stable  Post vital signs:  Reviewed and stable  Last Vitals:  Vitals:   03/03/21 1222  BP: 132/73  Pulse: 71  Resp: 18  Temp: 36.7 C  SpO2: 86%    Complications: No apparent anesthesia complications

## 2021-03-03 NOTE — Op Note (Signed)
[facility] Patient Name: [name] Procedure Date: 03/03/2021 MRN: [id number] Attending MD: Lear Ng , MD Date of Birth: 1940-05-24 CSN: [id number] Age: 80 Admit Type: Outpatient Procedure:                Flexible Sigmoidoscopy Indications:              Rectal hemorrhage Providers:                Lear Ng, MD, Burtis Junes, RN, Hinton Dyer Referring MD:             Ernestene Kiel Medicines:                Propofol per Anesthesia, Monitored Anesthesia Care Complications:            No immediate complications. Estimated Blood Loss:     Estimated blood loss was minimal. Procedure:                Pre-Anesthesia Assessment:                           - Prior to the procedure, a History and Physical                            was performed, and patient medications and                            allergies were reviewed. The patient's tolerance of                            previous anesthesia was also reviewed. The risks                            and benefits of the procedure and the sedation                            options and risks were discussed with the patient.                            All questions were answered, and informed consent                            was obtained. Prior Anticoagulants: The patient has                            taken no previous anticoagulant or antiplatelet                            agents. ASA Grade Assessment: III - A patient with                            severe systemic disease. After reviewing the risks                            and benefits, the patient was deemed in  satisfactory condition to undergo the procedure.                           After obtaining informed consent, the scope was                            passed under direct vision. The PCF-HQ190L                            ([id number]) Olympus colonoscope was introduced                            through the anus and  advanced to the the                            rectosigmoid junction. The flexible sigmoidoscopy                            was accomplished without difficulty. The patient                            tolerated the procedure well. The quality of the                            bowel preparation was adequate and good. Scope In: 1:17:21 PM Scope Out: 1:29:20 PM Total Procedure Duration: 0 hours 11 minutes 59 seconds  Findings:      The perianal and digital rectal examinations were normal.      A segmental area of moderately friable (with contact bleeding),       hemorrhagic and petechial mucosa was found in the rectum. Fulguration to       ablate the lesion by argon plasma was successful. Estimated blood loss       was minimal.      Internal hemorrhoids were found during retroflexion. The hemorrhoids       were medium-sized and Grade I (internal hemorrhoids that do not       prolapse). Impression:               - Friable (with contact bleeding), hemorrhagic and                            petechial mucosa in the rectum. Treated with argon                            plasma coagulation (APC).                           - Internal hemorrhoids.                           - No specimens collected. Moderate Sedation:      N/A - MAC procedure Recommendation:           - Resume previous diet. Procedure Code(s):        --- Professional ---  50354, 52, Sigmoidoscopy, flexible; with control of                            bleeding, any method Diagnosis Code(s):        --- Professional ---                           K62.5, Hemorrhage of anus and rectum                           K64.0, First degree hemorrhoids CPT copyright 2019 [employer] Medical Association. All rights reserved. The codes documented in this report are preliminary and upon coder review may  be revised to meet current compliance requirements. Lear Ng, MD 03/03/2021 1:42:26 PM This report has been signed  electronically. Number of Addenda: 0

## 2021-03-03 NOTE — Discharge Instructions (Signed)

## 2021-03-04 ENCOUNTER — Encounter (HOSPITAL_COMMUNITY): Payer: Self-pay | Admitting: Gastroenterology

## 2021-03-12 ENCOUNTER — Ambulatory Visit (HOSPITAL_COMMUNITY)
Admission: RE | Admit: 2021-03-12 | Discharge: 2021-03-12 | Disposition: A | Discharge status: Home / Self Care | Payer: Medicare Other | Source: Ambulatory Visit | Attending: Pulmonary Disease | Admitting: Pulmonary Disease

## 2021-03-12 ENCOUNTER — Other Ambulatory Visit: Payer: Self-pay

## 2021-03-12 ENCOUNTER — Other Ambulatory Visit (HOSPITAL_COMMUNITY): Payer: Self-pay | Admitting: Pulmonary Disease

## 2021-03-12 DIAGNOSIS — R0781 Pleurodynia: Secondary | ICD-10-CM | POA: Insufficient documentation

## 2021-03-12 DIAGNOSIS — R079 Chest pain, unspecified: Secondary | ICD-10-CM | POA: Diagnosis not present

## 2021-04-23 DIAGNOSIS — M79644 Pain in right finger(s): Secondary | ICD-10-CM | POA: Diagnosis not present

## 2021-04-23 DIAGNOSIS — M79674 Pain in right toe(s): Secondary | ICD-10-CM | POA: Diagnosis not present

## 2021-05-08 DIAGNOSIS — M79644 Pain in right finger(s): Secondary | ICD-10-CM | POA: Diagnosis not present

## 2021-05-08 DIAGNOSIS — M654 Radial styloid tenosynovitis [de Quervain]: Secondary | ICD-10-CM | POA: Diagnosis not present

## 2021-06-09 DIAGNOSIS — D167 Benign neoplasm of ribs, sternum and clavicle: Secondary | ICD-10-CM | POA: Diagnosis not present

## 2021-06-09 DIAGNOSIS — E291 Testicular hypofunction: Secondary | ICD-10-CM | POA: Diagnosis not present

## 2021-06-09 DIAGNOSIS — C61 Malignant neoplasm of prostate: Secondary | ICD-10-CM | POA: Diagnosis not present

## 2021-06-09 DIAGNOSIS — N1831 Chronic kidney disease, stage 3a: Secondary | ICD-10-CM | POA: Diagnosis not present

## 2021-06-09 DIAGNOSIS — E119 Type 2 diabetes mellitus without complications: Secondary | ICD-10-CM | POA: Diagnosis not present

## 2021-06-09 DIAGNOSIS — Z79899 Other long term (current) drug therapy: Secondary | ICD-10-CM | POA: Diagnosis not present

## 2021-06-09 DIAGNOSIS — I350 Nonrheumatic aortic (valve) stenosis: Secondary | ICD-10-CM | POA: Diagnosis not present

## 2021-06-09 DIAGNOSIS — E7800 Pure hypercholesterolemia, unspecified: Secondary | ICD-10-CM | POA: Diagnosis not present

## 2021-06-09 DIAGNOSIS — I440 Atrioventricular block, first degree: Secondary | ICD-10-CM | POA: Diagnosis not present

## 2021-06-09 DIAGNOSIS — M4310 Spondylolisthesis, site unspecified: Secondary | ICD-10-CM | POA: Diagnosis not present

## 2021-06-09 DIAGNOSIS — I119 Hypertensive heart disease without heart failure: Secondary | ICD-10-CM | POA: Diagnosis not present

## 2021-06-09 DIAGNOSIS — R5383 Other fatigue: Secondary | ICD-10-CM | POA: Diagnosis not present

## 2021-06-15 ENCOUNTER — Other Ambulatory Visit (HOSPITAL_COMMUNITY): Payer: Self-pay | Admitting: Pulmonary Disease

## 2021-06-15 DIAGNOSIS — M899 Disorder of bone, unspecified: Secondary | ICD-10-CM

## 2021-06-16 ENCOUNTER — Other Ambulatory Visit: Payer: Self-pay

## 2021-06-16 ENCOUNTER — Ambulatory Visit (HOSPITAL_COMMUNITY)
Admission: RE | Admit: 2021-06-16 | Discharge: 2021-06-16 | Disposition: A | Discharge status: Home / Self Care | Payer: Medicare Other | Source: Ambulatory Visit | Attending: Pulmonary Disease | Admitting: Pulmonary Disease

## 2021-06-16 DIAGNOSIS — M899 Disorder of bone, unspecified: Secondary | ICD-10-CM

## 2021-06-23 ENCOUNTER — Other Ambulatory Visit: Payer: Self-pay

## 2021-06-23 ENCOUNTER — Encounter: Payer: Self-pay | Admitting: Podiatry

## 2021-06-23 ENCOUNTER — Ambulatory Visit (INDEPENDENT_AMBULATORY_CARE_PROVIDER_SITE_OTHER): Payer: Medicare Other

## 2021-06-23 ENCOUNTER — Ambulatory Visit (INDEPENDENT_AMBULATORY_CARE_PROVIDER_SITE_OTHER): Payer: Medicare Other | Admitting: Podiatry

## 2021-06-23 DIAGNOSIS — E7800 Pure hypercholesterolemia, unspecified: Secondary | ICD-10-CM | POA: Insufficient documentation

## 2021-06-23 DIAGNOSIS — N400 Enlarged prostate without lower urinary tract symptoms: Secondary | ICD-10-CM | POA: Insufficient documentation

## 2021-06-23 DIAGNOSIS — M79674 Pain in right toe(s): Secondary | ICD-10-CM

## 2021-06-23 DIAGNOSIS — H6123 Impacted cerumen, bilateral: Secondary | ICD-10-CM | POA: Insufficient documentation

## 2021-06-23 DIAGNOSIS — M19071 Primary osteoarthritis, right ankle and foot: Secondary | ICD-10-CM | POA: Insufficient documentation

## 2021-06-23 DIAGNOSIS — M2021 Hallux rigidus, right foot: Secondary | ICD-10-CM | POA: Diagnosis not present

## 2021-06-23 DIAGNOSIS — I129 Hypertensive chronic kidney disease with stage 1 through stage 4 chronic kidney disease, or unspecified chronic kidney disease: Secondary | ICD-10-CM | POA: Insufficient documentation

## 2021-06-23 DIAGNOSIS — H2513 Age-related nuclear cataract, bilateral: Secondary | ICD-10-CM | POA: Insufficient documentation

## 2021-06-23 DIAGNOSIS — I359 Nonrheumatic aortic valve disorder, unspecified: Secondary | ICD-10-CM | POA: Insufficient documentation

## 2021-06-23 DIAGNOSIS — K219 Gastro-esophageal reflux disease without esophagitis: Secondary | ICD-10-CM | POA: Insufficient documentation

## 2021-06-23 DIAGNOSIS — E785 Hyperlipidemia, unspecified: Secondary | ICD-10-CM | POA: Insufficient documentation

## 2021-06-23 DIAGNOSIS — Z23 Encounter for immunization: Secondary | ICD-10-CM | POA: Insufficient documentation

## 2021-06-23 DIAGNOSIS — H524 Presbyopia: Secondary | ICD-10-CM | POA: Insufficient documentation

## 2021-06-23 MED ORDER — BETAMETHASONE SOD PHOS & ACET 6 (3-3) MG/ML IJ SUSP
6.0000 mg | Freq: Once | INTRAMUSCULAR | Status: AC
  Administered 2021-06-23: 6 mg

## 2021-06-23 NOTE — Progress Notes (Signed)
°  Subjective:  Patient ID: [name], male    DOB: [birth year],  MRN: [id number]  Chief Complaint  Patient presents with   Foot Pain    I was here about 3 years ago and the right big toe is hurting    81 y.o. male presents with the above complaint. History confirmed with patient. States the injection helped previously. Joint continues to hurt, walking and shoes make the pain worse.  Objective:  Physical Exam: warm, good capillary refill, no trophic changes or ulcerative lesions, normal DP and PT pulses, and normal sensory exam.  Right Foot: POP right 1st MPJ, crepitus on ROM, large dorsal exostosis, decreased joint ROM   No images are attached to the encounter.  Radiographs: X-ray of the right foot: severe degenerative changes 1st metatarsophalangeal joint Assessment:   1. Hallux rigidus of right foot   2. Great toe pain, right    Plan:  Patient was evaluated and treated and all questions answered.  Hallux rigidus -Educated on etiology -XR reviewed with patient -Injection delivered to the painful joint  Procedure: Joint Injection Location: Right 1st MPJ joint Skin Prep: Alcohol. Injectate: 0.5 cc 1% lidocaine plain, 0.5 cc betamethasone acetate-betamethasone sodium phosphate Disposition: Patient tolerated procedure well. Injection site dressed with a band-aid.  Return in about 4 weeks (around 07/21/2021) for Arthritis right great toe injection f/u.

## 2021-07-21 ENCOUNTER — Ambulatory Visit (INDEPENDENT_AMBULATORY_CARE_PROVIDER_SITE_OTHER): Payer: Medicare Other | Admitting: Podiatry

## 2021-07-21 ENCOUNTER — Other Ambulatory Visit: Payer: Self-pay

## 2021-07-21 ENCOUNTER — Encounter: Payer: Self-pay | Admitting: Podiatry

## 2021-07-21 DIAGNOSIS — M2021 Hallux rigidus, right foot: Secondary | ICD-10-CM | POA: Diagnosis not present

## 2021-07-21 DIAGNOSIS — M79674 Pain in right toe(s): Secondary | ICD-10-CM | POA: Diagnosis not present

## 2021-07-21 NOTE — Progress Notes (Signed)
?  Subjective:  ?Patient ID: [name], male    DOB: [birth year],   MRN: [id number] ? ?Chief Complaint  ?Patient presents with  ? Hammer Toe  ?  Right 2nd hammer toe.   ? Toe Pain  ?  4 week follow up right great toe pain, decreased ROM and stiffness. Pt states pain has not changed much. Injections from last visit only helped slightly.   ? ? ?81 y.o. male presents for follow-up of hallux rigidus on the right. Relates the injection helped a little but not much and still having pain in the foot.  . Denies any other pedal complaints. Denies n/v/f/c.  ? ?Past Medical History:  ?Diagnosis Date  ? Anxiety   ? Arthritis   ? shoulders , back  ? Back pain   ? buldging disc  ? CKD (chronic kidney disease), stage III ([place])   ? Coronary artery disease cardiologist-- [name]  ? a. s/p SVG-LCx at time of AVR on [id number].  ? Diastolic CHF, chronic ([place])   ? followed by [name]  ? First degree heart block   ? Hemorrhoids   ? History of palpitations   ? per pt 05/ 2020 notified his cardiologsit, [name], had an episode of palpitations;  [name] ordered Zio patch montior results 10-17-2018, per [name] note no signifiant abnormality (01-08-2019 pt stated has not had any palpitations since that one time)  ? History of pulmonary embolus (PE)   ? 09-11-2011 post op surgery CABG/ AVR on 09-02-2011----bilateral PE and right pleural effusionper pt completed coumadin  ? Hyperlipidemia   ? takes Trilipix daily  ? Hypertension   ? Nocturia   ? Pre-diabetes   ? Prostate cancer [facility]) urologist-- dr eskridge/  dr [name]  ? dx 07-24-2018,  Stage T1c, Gleason 3+4  ? S/P aortic valve replacement   ? S/P aortic valve replacement with prosthetic valve 09/02/2018  ? bioprosthetic  ? Wears dentures   ? full upper and partial lower  ? Wears glasses   ? ? ?Objective:  ?Physical Exam: ?Vascular: DP/PT pulses 2/4 bilateral. CFT <3 seconds. Normal hair growth on digits. No edema.  ?Skin. No lacerations or abrasions bilateral  feet.  ?Musculoskeletal: MMT 5/5 bilateral lower extremities in DF, PF, Inversion and Eversion. Deceased ROM in DF of ankle joint. Tender to first MPJ on the right with reduced ROM and pain with pf.  ?Neurological: Sensation intact to light touch.  ? ?Assessment:  ? ?1. Hallux rigidus of right foot   ?2. Great toe pain, right   ? ? ? ?Plan:  ?Patient was evaluated and treated and all questions answered. ?-Xrays reviewed ?-Discussed hallux limitus and  treatement options; conservative and  Surgical management; risks, benefits, alternatives discussed. All patient's questions answered. ?-Continue taking tylenol.  ?-No injection today.  ?-Recommend continue with good supportive shoes and inserts. Discussed stiff soled shoes and use of carbon fiber foot plate.   ?-Foot plate dispensed.  ?-Patient to return to office as needed or sooner if condition worsens. ? ? ?Lorenda Peck, DPM  ? ? ?

## 2021-07-28 DIAGNOSIS — M654 Radial styloid tenosynovitis [de Quervain]: Secondary | ICD-10-CM | POA: Diagnosis not present

## 2021-08-03 DIAGNOSIS — N401 Enlarged prostate with lower urinary tract symptoms: Secondary | ICD-10-CM | POA: Diagnosis not present

## 2021-08-03 DIAGNOSIS — R351 Nocturia: Secondary | ICD-10-CM | POA: Diagnosis not present

## 2021-08-03 DIAGNOSIS — Z8546 Personal history of malignant neoplasm of prostate: Secondary | ICD-10-CM | POA: Diagnosis not present

## 2021-09-15 DIAGNOSIS — M4310 Spondylolisthesis, site unspecified: Secondary | ICD-10-CM | POA: Diagnosis not present

## 2021-09-15 DIAGNOSIS — Z79899 Other long term (current) drug therapy: Secondary | ICD-10-CM | POA: Diagnosis not present

## 2021-09-15 DIAGNOSIS — M89211 Other disorders of bone development and growth, right shoulder: Secondary | ICD-10-CM | POA: Diagnosis not present

## 2021-09-15 DIAGNOSIS — E119 Type 2 diabetes mellitus without complications: Secondary | ICD-10-CM | POA: Diagnosis not present

## 2021-09-15 DIAGNOSIS — C61 Malignant neoplasm of prostate: Secondary | ICD-10-CM | POA: Diagnosis not present

## 2021-09-15 DIAGNOSIS — M899 Disorder of bone, unspecified: Secondary | ICD-10-CM | POA: Diagnosis not present

## 2021-09-15 DIAGNOSIS — N1831 Chronic kidney disease, stage 3a: Secondary | ICD-10-CM | POA: Diagnosis not present

## 2021-09-15 DIAGNOSIS — R5383 Other fatigue: Secondary | ICD-10-CM | POA: Diagnosis not present

## 2021-09-15 DIAGNOSIS — I119 Hypertensive heart disease without heart failure: Secondary | ICD-10-CM | POA: Diagnosis not present

## 2021-09-15 DIAGNOSIS — D167 Benign neoplasm of ribs, sternum and clavicle: Secondary | ICD-10-CM | POA: Diagnosis not present

## 2021-09-15 DIAGNOSIS — I2510 Atherosclerotic heart disease of native coronary artery without angina pectoris: Secondary | ICD-10-CM | POA: Diagnosis not present

## 2021-09-15 DIAGNOSIS — E7800 Pure hypercholesterolemia, unspecified: Secondary | ICD-10-CM | POA: Diagnosis not present

## 2021-09-15 DIAGNOSIS — I350 Nonrheumatic aortic (valve) stenosis: Secondary | ICD-10-CM | POA: Diagnosis not present

## 2021-09-16 DIAGNOSIS — M545 Low back pain, unspecified: Secondary | ICD-10-CM | POA: Diagnosis not present

## 2021-11-23 DIAGNOSIS — I350 Nonrheumatic aortic (valve) stenosis: Secondary | ICD-10-CM | POA: Diagnosis not present

## 2021-11-23 DIAGNOSIS — M899 Disorder of bone, unspecified: Secondary | ICD-10-CM | POA: Diagnosis not present

## 2021-11-23 DIAGNOSIS — N1831 Chronic kidney disease, stage 3a: Secondary | ICD-10-CM | POA: Diagnosis not present

## 2021-11-23 DIAGNOSIS — L209 Atopic dermatitis, unspecified: Secondary | ICD-10-CM | POA: Diagnosis not present

## 2021-11-23 DIAGNOSIS — R5383 Other fatigue: Secondary | ICD-10-CM | POA: Diagnosis not present

## 2021-11-23 DIAGNOSIS — Z79899 Other long term (current) drug therapy: Secondary | ICD-10-CM | POA: Diagnosis not present

## 2021-11-23 DIAGNOSIS — M4310 Spondylolisthesis, site unspecified: Secondary | ICD-10-CM | POA: Diagnosis not present

## 2021-11-23 DIAGNOSIS — E119 Type 2 diabetes mellitus without complications: Secondary | ICD-10-CM | POA: Diagnosis not present

## 2021-11-23 DIAGNOSIS — E7800 Pure hypercholesterolemia, unspecified: Secondary | ICD-10-CM | POA: Diagnosis not present

## 2021-11-23 DIAGNOSIS — I119 Hypertensive heart disease without heart failure: Secondary | ICD-10-CM | POA: Diagnosis not present

## 2021-11-23 DIAGNOSIS — I2510 Atherosclerotic heart disease of native coronary artery without angina pectoris: Secondary | ICD-10-CM | POA: Diagnosis not present

## 2021-11-23 DIAGNOSIS — C61 Malignant neoplasm of prostate: Secondary | ICD-10-CM | POA: Diagnosis not present

## 2021-12-02 DIAGNOSIS — M5416 Radiculopathy, lumbar region: Secondary | ICD-10-CM | POA: Diagnosis not present

## 2021-12-22 DIAGNOSIS — M5416 Radiculopathy, lumbar region: Secondary | ICD-10-CM | POA: Diagnosis not present

## 2022-01-19 DIAGNOSIS — I350 Nonrheumatic aortic (valve) stenosis: Secondary | ICD-10-CM | POA: Diagnosis not present

## 2022-01-19 DIAGNOSIS — M899 Disorder of bone, unspecified: Secondary | ICD-10-CM | POA: Diagnosis not present

## 2022-01-19 DIAGNOSIS — E7800 Pure hypercholesterolemia, unspecified: Secondary | ICD-10-CM | POA: Diagnosis not present

## 2022-01-19 DIAGNOSIS — I119 Hypertensive heart disease without heart failure: Secondary | ICD-10-CM | POA: Diagnosis not present

## 2022-01-19 DIAGNOSIS — E119 Type 2 diabetes mellitus without complications: Secondary | ICD-10-CM | POA: Diagnosis not present

## 2022-01-19 DIAGNOSIS — N1831 Chronic kidney disease, stage 3a: Secondary | ICD-10-CM | POA: Diagnosis not present

## 2022-01-19 DIAGNOSIS — R5383 Other fatigue: Secondary | ICD-10-CM | POA: Diagnosis not present

## 2022-01-19 DIAGNOSIS — Z79899 Other long term (current) drug therapy: Secondary | ICD-10-CM | POA: Diagnosis not present

## 2022-01-19 DIAGNOSIS — L209 Atopic dermatitis, unspecified: Secondary | ICD-10-CM | POA: Diagnosis not present

## 2022-01-19 DIAGNOSIS — C61 Malignant neoplasm of prostate: Secondary | ICD-10-CM | POA: Diagnosis not present

## 2022-01-19 DIAGNOSIS — M4310 Spondylolisthesis, site unspecified: Secondary | ICD-10-CM | POA: Diagnosis not present

## 2022-01-19 DIAGNOSIS — I2510 Atherosclerotic heart disease of native coronary artery without angina pectoris: Secondary | ICD-10-CM | POA: Diagnosis not present

## 2022-01-28 DIAGNOSIS — Z8546 Personal history of malignant neoplasm of prostate: Secondary | ICD-10-CM | POA: Diagnosis not present

## 2022-01-31 NOTE — Progress Notes (Signed)
Office Visit    Patient Name: [name] Date of Encounter: 02/01/2022  Primary Care Provider:  [name], MD Primary Cardiologist:  [name], MD Primary Electrophysiologist: None  Chief Complaint    [name] is a 81 y.o. male with PMH of CAD s/p CABG x1 (SVG-OM), and AVR replacement 12/6765 complicated by bilateral PE postop, HTN, HLD, CKD stage III, HFpEF, prostate CA who presents today for follow-up of coronary artery disease.  Past Medical History    Past Medical History:  Diagnosis Date   Anxiety    Arthritis    shoulders , back   Back pain    buldging disc   CKD (chronic kidney disease), stage III [facility])    Coronary artery disease cardiologist-- [name]   a. s/p SVG-LCx at time of AVR on 09-02-2011.   Diastolic CHF, chronic ([place])    followed by [name]   First degree heart block    Hemorrhoids    History of palpitations    per pt 05/ 2020 notified his cardiologsit, [name], had an episode of palpitations;  [name] ordered Zio patch montior results 10-17-2018, per [name] note no signifiant abnormality (01-08-2019 pt stated has not had any palpitations since that one time)   History of pulmonary embolus (PE)    09-11-2011 post op surgery CABG/ AVR on 09-02-2011----bilateral PE and right pleural effusionper pt completed coumadin   Hyperlipidemia    takes Trilipix daily   Hypertension    Nocturia    Pre-diabetes    Prostate cancer [facility]) urologist-- dr eskridge/  dr [name]   dx 07-24-2018,  Stage T1c, Gleason 3+4   S/P aortic valve replacement    S/P aortic valve replacement with prosthetic valve 09/02/2018   bioprosthetic   Wears dentures    full upper and partial lower   Wears glasses    Past Surgical History:  Procedure Laterality Date   AORTIC VALVE REPLACEMENT (AVR)/CORONARY ARTERY BYPASS GRAFTING (CABG)  09-02-2011   dr [name]   SVG to LCx and Bioprosthetic aortic valve   BLEPHAROPLASTY Right 2009  approx.   CARDIAC CATHETERIZATION  08-19-2018   [name]   severe single vessel LCx and severe aortic valve stenosis, normal LVF   COLONOSCOPY     2007/2012   CYST REMOVAL NECK  1970s   benign per pt   ESOPHAGOGASTRODUODENOSCOPY     FLEXIBLE SIGMOIDOSCOPY N/A 03/03/2021   Procedure: FLEXIBLE SIGMOIDOSCOPY;  Surgeon: Wilford Corner, MD;  Location: [facility];  Service: Endoscopy;  Laterality: N/A;   HOT HEMOSTASIS N/A 03/03/2021   Procedure: HOT HEMOSTASIS (ARGON PLASMA COAGULATION/BICAP);  Surgeon: Wilford Corner, MD;  Location: [facility];  Service: Endoscopy;  Laterality: N/A;   RADIOACTIVE SEED IMPLANT N/A 01/09/2019   Procedure: RADIOACTIVE SEED IMPLANT/BRACHYTHERAPY IMPLANT;  Surgeon: [name], MD;  Location: [facility];  Service: Urology;  Laterality: N/A;  78 seeds implanted cysto per Dr [name], no seeds found    Allergies  Allergies  Allergen Reactions   Codeine Other (See Comments)    Nervous and jittery.    History of Present Illness    [name]  is a 81 year old male with the above mention past medical history who presents today for follow-up of coronary artery disease.  [name] was previously followed by [name] in [place] cardiology for aortic stenosis.  He was seen in office 07/2011 for annual echo follow-up.  Echo revealed progression of aortic valve disease.  During visit patient also endorsed intermittent left-sided chest pain. He subsequently underwent cardiac catheterization in preparation for surgery which showed severe aortic stenosis with a 75% left circumflex stenosis and insignificant 30-50% right coronary stenosis.  He underwent CABG x1 and AVR with bioprosthetic valve on 09/02/2011.  He was readmitted 09/19/2011 for complaint of dyspnea and chest pain.  CT scan was completed and showed moderate-sized right pleural effusion and bilateral pulmonary embolism.  He was treated with heparin and sent home with Xarelto.  He  completed Lexiscan Myoview in 2020 for complaint of chest discomfort that showed low risk and no evidence of ischemia.  Patient had 2D echo last completed 09/2020 with EF of 60-65%, no RWMA, moderate LVH, normal RV function with trivial aortic regurgitation.  He was last seen by [name] on 11/2020 for follow-up.  During visit patient was doing well he denied any angina.  He was euvolemic and blood pressure was controlled.  Patient's LDL cholesterol was not at goal and recommendation for statin therapy low-dose or further agents was discussed.  [name] presents today alone for annual follow-up.  Since last being seen in the office patient reports he has been doing well from a cardiac perspective.  He does note an occasional episode or 2 of heart racing that occurs without activity and sometimes after eating dinner.  He denies any shortness of breath or dizziness with these episodes.  His blood pressure today was well controlled at 136/76 and heart rate was 73 bpm.  He is doing some walking but is hindered by chronic back pain that he is receiving epidural injections for treatment.  He was told he is not a candidate for surgical repair.  Patient denies chest pain, palpitations, dyspnea, PND, orthopnea, nausea, vomiting, dizziness, syncope, edema, weight gain, or early satiety.   Home Medications    Current Outpatient Medications  Medication Sig Dispense Refill   amoxicillin (AMOXIL) 500 MG capsule Take 2,000 mg by mouth once. Dental procedure     aspirin EC 81 MG tablet Take 81 mg by mouth daily.     chlorthalidone (HYGROTON) 25 MG tablet Take 0.5 tablets (12.5 mg total) by mouth every Monday, Wednesday, and Friday. 20 tablet 3   Cholecalciferol (VITAMIN D3) 50 MCG (2000 UT) TABS Take 2,000 Units by mouth daily.     Choline Fenofibrate (FENOFIBRIC ACID) 45 MG CPDR Take 45 mg by mouth at bedtime.  4   EDARBI 40 MG TABS Take 40 mg by mouth daily.     fenofibrate micronized (LOFIBRA) 134 MG capsule 1  capsule with a meal     Glycerin-Polysorbate 80 (REFRESH DRY EYE THERAPY OP) Place 1 drop into both eyes at bedtime as needed (Dry eye).     JARDIANCE 10 MG TABS tablet Take 10 mg by mouth daily.     meclizine (ANTIVERT) 12.5 MG tablet Take 12.5 mg by mouth daily as needed for dizziness.     Multiple Vitamin (MULTIVITAMIN) tablet Take 1 tablet by mouth daily.     rosuvastatin (CRESTOR) 5 MG tablet Take 2.5 mg by mouth 3 (three) times a week.     solifenacin (VESICARE) 5 MG tablet Take 5 mg by mouth daily.     Tamsulosin HCl (FLOMAX) 0.4 MG CAPS Take 0.4 mg by mouth at bedtime.     traMADol (ULTRAM) 50 MG tablet one tab     vitamin C (ASCORBIC ACID) 500 MG tablet 1 tablet  No current facility-administered medications for this visit.     Review of Systems  Please see the history of present illness.    (+) Palpitations (+) Chronic back pain  All other systems reviewed and are otherwise negative except as noted above.  Physical Exam    Wt Readings from Last 3 Encounters:  02/01/22 186 lb 0.2 oz (84.4 kg)  03/03/21 183 lb (83 kg)  11/07/20 185 lb 3.2 oz (84 kg)   VS: Vitals:   02/01/22 0844  BP: 136/76  Pulse: 73  SpO2: 97%  ,Body mass index is 26.69 kg/m.  Constitutional:      Appearance: Healthy appearance. Not in distress.  Neck:     Vascular: JVD normal.  Pulmonary:     Effort: Pulmonary effort is normal.     Breath sounds: No wheezing. No rales. Diminished in the bases Cardiovascular:     Normal rate. Regular rhythm. Normal S1. Normal S2.      Murmurs: There is no murmur.  Edema:    Peripheral edema absent.  Abdominal:     Palpations: Abdomen is soft non tender. There is no hepatomegaly.  Skin:    General: Skin is warm and dry.  Neurological:     General: No focal deficit present.     Mental Status: Alert and oriented to person, place and time.     Cranial Nerves: Cranial nerves are intact.  EKG/LABS/Other Studies Reviewed    ECG personally reviewed by me  today -sinus rhythm with first-degree AV block and PVCs with left atrial enlargement and rate of 73 bpm with no acute changes.  Lab Results  Component Value Date   WBC 5.0 04/02/2019   HGB 13.5 04/02/2019   HCT 41.6 04/02/2019   MCV 89.5 04/02/2019   PLT 197 04/02/2019   Lab Results  Component Value Date   CREATININE 1.62 (H) 04/02/2019   BUN 13 04/02/2019   NA 138 04/02/2019   K 3.6 04/02/2019   CL 101 04/02/2019   CO2 30 04/02/2019   Lab Results  Component Value Date   ALT 18 01/05/2019   AST 25 01/05/2019   ALKPHOS 54 01/05/2019   BILITOT 0.7 01/05/2019   Lab Results  Component Value Date   CHOL 105 09/05/2011   HDL 23 (L) 09/05/2011   LDLCALC 56 09/05/2011   TRIG 131 09/05/2011   CHOLHDL 4.6 09/05/2011    Lab Results  Component Value Date   HGBA1C 6.2 (H) 08/30/2011    Assessment & Plan    1.  Coronary artery disease: - s/p CABG x1 with AVR in 2013, patient's last Lexiscan Myoview was 2020 and was normal -Today patient reports no angina but does report palpitations that have been occurring with and without activity. -We will have patient wear a 7-day ZIO monitor for evaluation of palpitations. -Continue GDMT with ASA 81 mg, Crestor 5 mg 3 times weekly, and fenofibrate  2.  S/P Aortic valve replacement: -Patient's last 2D echo performed 2022 with trivial AVR and well-functioning valve -SBE prophylaxis discussed -Today patient reports no shortness of breath, chest pain, or dizziness -Blood pressures well controlled and patient is euvolemic on examination.  3.  Chronic HFpEF: -Patient is euvolemic on examination and reports no shortness of breath. -2D echo was completed in 2022 with normal EF and valvular function.  4.  HTN: -Blood pressure today was well controlled at 136/76 -Continue [place] 12.5 M, W, F   5.  HLD: -Patient's last LDL cholesterol was 67  less than 70 and at goal. -Currently followed by PCP -Continue Crestor and fenofibrate as noted  above  Disposition: Follow-up with Belva Crome III, MD or APP in 12 months    Medication Adjustments/Labs and Tests Ordered: Current medicines are reviewed at length with the patient today.  Concerns regarding medicines are outlined above.   Signed, Mable Fill, Marissa Nestle, NP 02/01/2022, 9:04 AM [place]

## 2022-02-01 ENCOUNTER — Ambulatory Visit: Payer: Medicare Other | Attending: Nurse Practitioner | Admitting: Nurse Practitioner

## 2022-02-01 ENCOUNTER — Encounter: Payer: Self-pay | Admitting: Nurse Practitioner

## 2022-02-01 ENCOUNTER — Ambulatory Visit (INDEPENDENT_AMBULATORY_CARE_PROVIDER_SITE_OTHER): Payer: Medicare Other

## 2022-02-01 VITALS — BP 136/76 | HR 73 | Ht 70.0 in | Wt 186.0 lb

## 2022-02-01 DIAGNOSIS — R002 Palpitations: Secondary | ICD-10-CM | POA: Insufficient documentation

## 2022-02-01 DIAGNOSIS — I2510 Atherosclerotic heart disease of native coronary artery without angina pectoris: Secondary | ICD-10-CM | POA: Insufficient documentation

## 2022-02-01 DIAGNOSIS — I359 Nonrheumatic aortic valve disorder, unspecified: Secondary | ICD-10-CM | POA: Diagnosis not present

## 2022-02-01 DIAGNOSIS — E785 Hyperlipidemia, unspecified: Secondary | ICD-10-CM | POA: Insufficient documentation

## 2022-02-01 DIAGNOSIS — I10 Essential (primary) hypertension: Secondary | ICD-10-CM | POA: Diagnosis not present

## 2022-02-01 DIAGNOSIS — I5032 Chronic diastolic (congestive) heart failure: Secondary | ICD-10-CM | POA: Insufficient documentation

## 2022-02-01 NOTE — Progress Notes (Unsigned)
Enrolled for [employer] to mail a ZIO XT long term holter monitor to the patients address on file.   Dr. [name] to read. 

## 2022-02-01 NOTE — Patient Instructions (Signed)
Medication Instructions:   Your physician recommends that you continue on your current medications as directed. Please refer to the Current Medication list given to you today.   *If you need a refill on your cardiac medications before your next appointment, please call your pharmacy*   Lab Work:  None ordered.  If you have labs (blood work) drawn today and your tests are completely normal, you will receive your results only by: [place] (if you have MyChart) OR A paper copy in the mail If you have any lab test that is abnormal or we need to change your treatment, we will call you to review the results.   Testing/Procedures:  Bryn Gulling- Long Term Monitor Instructions  Your physician has requested you wear a ZIO patch monitor for 7  days.  This is a single patch monitor. [employer] supplies one patch monitor per enrollment. Additional stickers are not available. Please do not apply patch if you will be having a Nuclear Stress Test,  Echocardiogram, Cardiac CT, MRI, or Chest Xray during the period you would be wearing the  monitor. The patch cannot be worn during these tests. You cannot remove and re-apply the  ZIO XT patch monitor.  Your ZIO patch monitor will be mailed 3 day USPS to your address on file. It may take 3-5 days  to receive your monitor after you have been enrolled.  Once you have received your monitor, please review the enclosed instructions. Your monitor  has already been registered assigning a specific monitor serial # to you.  Billing and Patient Assistance Program Information  We have supplied [employer] with any of your insurance information on file for billing purposes. [employer] offers a sliding scale Patient Assistance Program for patients that do not have  insurance, or whose insurance does not completely cover the cost of the ZIO monitor.  You must apply for the Patient Assistance Program to qualify for this discounted rate.  To apply, please call [employer] at  [phone], select option 4, select option 2, ask to apply for  Patient Assistance Program. Theodore Demark will ask your household income, and how many people  are in your household. They will quote your out-of-pocket cost based on that information.  [employer] will also be able to set up a 40-month interest-free payment plan if needed.  Applying the monitor   Shave hair from upper left chest.  Hold abrader disc by orange tab. Rub abrader in 40 strokes over the upper left chest as  indicated in your monitor instructions.  Clean area with 4 enclosed alcohol pads. Let dry.  Apply patch as indicated in monitor instructions. Patch will be placed under collarbone on left  side of chest with arrow pointing upward.  Rub patch adhesive wings for 2 minutes. Remove white label marked "1". Remove the white  label marked "2". Rub patch adhesive wings for 2 additional minutes.  While looking in a mirror, press and release button in center of patch. A small green light will  flash 3-4 times. This will be your only indicator that the monitor has been turned on.  Do not shower for the first 24 hours. You may shower after the first 24 hours.  Press the button if you feel a symptom. You will hear a small click. Record Date, Time and  Symptom in the Patient Logbook.  When you are ready to remove the patch, follow instructions on the last 2 pages of Patient  Logbook. Stick patch monitor onto the last page of  Patient Logbook.  Place Patient Logbook in the blue and white box. Use locking tab on box and tape box closed  securely. The blue and white box has prepaid postage on it. Please place it in the mailbox as  soon as possible. Your physician should have your test results approximately 7 days after the  monitor has been mailed back to [facility].  Call [place] at [phone] if you have questions regarding  your ZIO XT patch monitor. Call them immediately if you see an orange light  blinking on your  monitor.  If your monitor falls off in less than 4 days, contact our Monitor department at [phone].  If your monitor becomes loose or falls off after 4 days call [employer] at [phone] for  suggestions on securing your monitor    Follow-Up: At [facility], you and your health needs are our priority.  As part of our continuing mission to provide you with exceptional heart care, we have created designated Provider Care Teams.  These Care Teams include your primary Cardiologist (physician) and Advanced Practice Providers (APPs -  Physician Assistants and Nurse Practitioners) who all work together to provide you with the care you need, when you need it.  We recommend signing up for the patient portal called "MyChart".  Sign up information is provided on this After Visit Summary.  MyChart is used to connect with patients for Virtual Visits (Telemedicine).  Patients are able to view lab/test results, encounter notes, upcoming appointments, etc.  Non-urgent messages can be sent to your provider as well.   To learn more about what you can do with MyChart, go to [email or web address].    Your next appointment:   1 year(s)  The format for your next appointment:   In Person  Provider:   Ambrose Pancoast, NP         Other Instructions  Your physician wants you to follow-up in: 1 year with Ambrose Pancoast, NP.  You will receive a reminder letter in the mail two months in advance. If you don't receive a letter, please call our office to schedule the follow-up appointment.   Important Information About [place]

## 2022-02-03 DIAGNOSIS — I2510 Atherosclerotic heart disease of native coronary artery without angina pectoris: Secondary | ICD-10-CM | POA: Diagnosis not present

## 2022-02-03 DIAGNOSIS — R002 Palpitations: Secondary | ICD-10-CM | POA: Diagnosis not present

## 2022-02-04 DIAGNOSIS — Z8546 Personal history of malignant neoplasm of prostate: Secondary | ICD-10-CM | POA: Diagnosis not present

## 2022-02-04 DIAGNOSIS — N401 Enlarged prostate with lower urinary tract symptoms: Secondary | ICD-10-CM | POA: Diagnosis not present

## 2022-02-04 DIAGNOSIS — N5201 Erectile dysfunction due to arterial insufficiency: Secondary | ICD-10-CM | POA: Diagnosis not present

## 2022-02-04 DIAGNOSIS — R351 Nocturia: Secondary | ICD-10-CM | POA: Diagnosis not present

## 2022-02-22 DIAGNOSIS — I2510 Atherosclerotic heart disease of native coronary artery without angina pectoris: Secondary | ICD-10-CM | POA: Diagnosis not present

## 2022-02-22 DIAGNOSIS — R002 Palpitations: Secondary | ICD-10-CM | POA: Diagnosis not present

## 2022-03-01 ENCOUNTER — Telehealth: Payer: Self-pay

## 2022-03-01 NOTE — Telephone Encounter (Signed)
-----   Message from Marylu Lund., NP sent at 02/27/2022  9:14 AM EDT ----- Please let Mr. Pilger know that his event monitor showed no atrial fibrillation or sustained tachyarrhythmias.  The monitor did show a moderate burden of extra beats in the bottom part of your heart that are called preventricular contractions.  The normal treatment to suppress these beats would be beta-blocker therapy however with your heart block present this would be contraindicated.  Please ask patient if he is having any symptoms associated with his PVCs and I will discuss further with [name] regarding recommendations for management.  Ambrose Pancoast, NP

## 2022-03-01 NOTE — Telephone Encounter (Signed)
Pt advised his monitor results and he reports that he has palpitations  on occasion but does not cause him to have dizziness or SOB they are more of a nuisance when he does have them.   He will continue to monitor them and let us know if they worsen and cause him any new symptoms or difficulties.

## 2022-04-09 ENCOUNTER — Encounter: Payer: Self-pay | Admitting: Podiatry

## 2022-04-09 ENCOUNTER — Ambulatory Visit (INDEPENDENT_AMBULATORY_CARE_PROVIDER_SITE_OTHER): Payer: Medicare Other | Admitting: Podiatry

## 2022-04-09 VITALS — BP 98/56 | HR 76 | Temp 98.0°F | Resp 12

## 2022-04-09 DIAGNOSIS — H2513 Age-related nuclear cataract, bilateral: Secondary | ICD-10-CM | POA: Diagnosis not present

## 2022-04-09 DIAGNOSIS — M7751 Other enthesopathy of right foot: Secondary | ICD-10-CM | POA: Diagnosis not present

## 2022-04-09 DIAGNOSIS — H04123 Dry eye syndrome of bilateral lacrimal glands: Secondary | ICD-10-CM | POA: Diagnosis not present

## 2022-04-09 DIAGNOSIS — H35373 Puckering of macula, bilateral: Secondary | ICD-10-CM | POA: Diagnosis not present

## 2022-04-09 DIAGNOSIS — H25013 Cortical age-related cataract, bilateral: Secondary | ICD-10-CM | POA: Diagnosis not present

## 2022-04-09 DIAGNOSIS — H35033 Hypertensive retinopathy, bilateral: Secondary | ICD-10-CM | POA: Diagnosis not present

## 2022-04-09 MED ORDER — TRIAMCINOLONE ACETONIDE 10 MG/ML IJ SUSP
10.0000 mg | Freq: Once | INTRAMUSCULAR | Status: AC
  Administered 2022-04-09: 10 mg

## 2022-04-12 NOTE — Progress Notes (Signed)
Subjective:   Patient ID: [name], male   DOB: 81 y.o.   MRN: [id number]   HPI Patient states he is beginning to get discomfort again around his big toe joint right foot stating that he had good relief for a period of time and it now has come back   ROS      Objective:  Physical Exam  Neurovascular status intact inflammation around the first MPJ right with fluid buildup around the joint surface and pain with palpation     Assessment:  Inflammatory capsulitis first MPJ right foot with fluid buildup and hallux limitus deformity     Plan:  H&P reviewed and reviewed x-ray and today again we will continue to treat conservatively.  I did sterile prep and injected around the joint 3 mg Kenalog 5 mg Xylocaine And advised on rigid bottom shoes reappoint to recheck as needed  X-rays taken do indicate spurs and narrowing of the joint surface around the first MPJ right

## 2022-04-12 NOTE — Progress Notes (Unsigned)
Cardiology Office Note   Date:  04/13/2022   ID:  [name], [name] [birth year], MRN [id number]  PCP:  [name], MD    No chief complaint on file.  CAD/s/p AVR  Wt Readings from Last 3 Encounters:  04/13/22 186 lb (84.4 kg)  02/01/22 186 lb 0.2 oz (84.4 kg)  03/03/21 183 lb (83 kg)       History of Present Illness: [name] is a 81 y.o. male  with a hx of  bioprosthetic aortic valve replacement placed in 2013, hypertension, CAD with CABG 2013, bilateral carotid bruits, and hyperlipidemia.  Postop bilateral pulmonary emboli.  2020 stress test: "The left ventricular ejection fraction is hyperdynamic (>65%). Nuclear stress EF: 69%. There was no ST segment deviation noted during stress. The study is normal. This is a low risk study.   Normal pharmacologic nuclear study with no evidence for prior infarct or ischemia."   2022 echo: "Left ventricular ejection fraction, by estimation, is 60 to 65%. Left  ventricular ejection fraction by 2D MOD biplane is 61.2 %. The left  ventricle has normal function. The left ventricle has no regional wall  motion abnormalities. There is moderate  concentric left ventricular hypertrophy. Left ventricular diastolic  parameters are indeterminate.   2. Right ventricular systolic function is normal. The right ventricular  size is normal. There is normal pulmonary artery systolic pressure.   3. The mitral valve is abnormal. Mild eccentric mitral valve  regurgitation secondary to prolapse.   4. The aortic valve has been repaired/replaced. Aortic valve  regurgitation is trivial. There is a bioprosthetic valve present in the  aortic position. Procedure Date: 09/02/2011. Aortic valve area, by VTI  measures 2.51 cm. Aortic valve mean gradient  measures 6.0 mmHg. Aortic valve Vmax measures 1.83 m/s. Aortic valve  acceleration time measures 66 msec. DVI of 0.55; stable bioprosthetic  valve parameters.   5. Aortic  dilatation noted. There is mild dilatation of the ascending  aorta, measuring 41 mm, though within normal limits for age and BSA.   Comparison(s): A prior study was performed on 06/09/16. Prior images  reviewed side by side. Similar valve parameters from prior. "  Last week, he had some GERD sx and fatigue.  No vomiting.  He had eaten some chicken that was not well heated.  BP was a little low.  HR was in the 60s.  He decreased his [place].  Energy level improved.    Back problems limit activity.    Denies : Chest pain. Dizziness. Leg edema. Nitroglycerin use. Orthopnea. Palpitations. Paroxysmal nocturnal dyspnea. Shortness of breath. Syncope.    Past Medical History:  Diagnosis Date   Anxiety    Arthritis    shoulders , back   Back pain    buldging disc   CKD (chronic kidney disease), stage III [facility])    Coronary artery disease cardiologist-- [name]   a. s/p SVG-LCx at time of AVR on 09-02-2011.   Diastolic CHF, chronic ([place])    followed by [name]   First degree heart block    Hemorrhoids    History of palpitations    per pt 05/ 2020 notified his cardiologsit, [name], had an episode of palpitations;  [name] ordered Zio patch montior results 10-17-2018, per [name] note no signifiant abnormality (01-08-2019 pt stated has not had any palpitations since that one time)   History of pulmonary embolus (PE)    09-11-2011 post  op surgery CABG/ AVR on 09-02-2011----bilateral PE and right pleural effusionper pt completed coumadin   Hyperlipidemia    takes Trilipix daily   Hypertension    Nocturia    Pre-diabetes    Prostate cancer [facility]) urologist-- dr eskridge/  dr [name]   dx 07-24-2018,  Stage T1c, Gleason 3+4   S/P aortic valve replacement    S/P aortic valve replacement with prosthetic valve 09/02/2018   bioprosthetic   Wears dentures    full upper and partial lower   Wears glasses     Past Surgical History:  Procedure Laterality Date   AORTIC VALVE  REPLACEMENT (AVR)/CORONARY ARTERY BYPASS GRAFTING (CABG)  09-02-2011   dr [name]   SVG to LCx and Bioprosthetic aortic valve   BLEPHAROPLASTY Right 2009 approx.   CARDIAC CATHETERIZATION  08-19-2018   [name]   severe single vessel LCx and severe aortic valve stenosis, normal LVF   COLONOSCOPY     2007/2012   CYST REMOVAL NECK  1970s   benign per pt   ESOPHAGOGASTRODUODENOSCOPY     FLEXIBLE SIGMOIDOSCOPY N/A 03/03/2021   Procedure: FLEXIBLE SIGMOIDOSCOPY;  Surgeon: Wilford Corner, MD;  Location: [facility];  Service: Endoscopy;  Laterality: N/A;   HOT HEMOSTASIS N/A 03/03/2021   Procedure: HOT HEMOSTASIS (ARGON PLASMA COAGULATION/BICAP);  Surgeon: Wilford Corner, MD;  Location: [facility];  Service: Endoscopy;  Laterality: N/A;   RADIOACTIVE SEED IMPLANT N/A 01/09/2019   Procedure: RADIOACTIVE SEED IMPLANT/BRACHYTHERAPY IMPLANT;  Surgeon: [name], MD;  Location[facility];  Service: Urology;  Laterality: N/A;  78 seeds implanted cysto per Dr [name], no seeds found     Current Outpatient Medications  Medication Sig Dispense Refill   amoxicillin (AMOXIL) 500 MG capsule Take 2,000 mg by mouth once. Dental procedure     aspirin EC 81 MG tablet Take 81 mg by mouth daily.     chlorthalidone (HYGROTON) 25 MG tablet Take 0.5 tablets (12.5 mg total) by mouth every Monday, Wednesday, and Friday. 20 tablet 3   Cholecalciferol (VITAMIN D3) 50 MCG (2000 UT) TABS Take 2,000 Units by mouth daily.     Choline Fenofibrate (FENOFIBRIC ACID) 45 MG CPDR Take 45 mg by mouth at bedtime.  4   EDARBI 40 MG TABS Take 40 mg by mouth daily.     Glycerin-Polysorbate 80 (REFRESH DRY EYE THERAPY OP) Place 1 drop into both eyes at bedtime as needed (Dry eye).     JARDIANCE 10 MG TABS tablet Take 10 mg by mouth daily.     meclizine (ANTIVERT) 12.5 MG tablet Take 12.5 mg by mouth daily as needed for dizziness.     Multiple Vitamin (MULTIVITAMIN) tablet Take 1 tablet by mouth  daily.     rosuvastatin (CRESTOR) 5 MG tablet Take 2.5 mg by mouth 3 (three) times a week.     solifenacin (VESICARE) 5 MG tablet Take 5 mg by mouth daily.     Tamsulosin HCl (FLOMAX) 0.4 MG CAPS Take 0.4 mg by mouth at bedtime.     vitamin C (ASCORBIC ACID) 500 MG tablet 1 tablet     No current facility-administered medications for this visit.    Allergies:   Codeine    Social History:  The patient  reports that he has never smoked. He has never used smokeless tobacco. He reports current alcohol use. He reports that he does not use drugs.   Family History:  The patient's family history includes Cancer in his mother; Hypertension in his  father and sister; Ovarian cancer in his sister.    ROS:  Please see the history of present illness.   Otherwise, review of systems are positive for left arm pain at times.   All other systems are reviewed and negative.    PHYSICAL EXAM: VS:  BP 130/68   Pulse 63   Ht '5\' 10"'$  (1.778 m)   Wt 186 lb (84.4 kg)   SpO2 98%   BMI 26.69 kg/m  , BMI Body mass index is 26.69 kg/m. GEN: Well nourished, well developed, in no acute distress HEENT: normal Neck: no JVD, carotid bruits, or masses Cardiac: RRR; 3/6 systolic murmur, no rubs, or gallops,no edema  Respiratory:  clear to auscultation bilaterally, normal work of breathing GI: soft, nontender, nondistended, + BS MS: no deformity or atrophy Skin: warm and dry, no rash Neuro:  Strength and sensation are intact Psych: euthymic mood, full affect   EKG:   The ekg ordered today demonstrates NSR, PVCs   Recent Labs: No results found for requested labs within last 365 days.   Lipid Panel    Component Value Date/Time   CHOL 105 09/05/2011 0544   TRIG 131 09/05/2011 0544   HDL 23 (L) 09/05/2011 0544   CHOLHDL 4.6 09/05/2011 0544   VLDL 26 09/05/2011 0544   LDLCALC 56 09/05/2011 0544     Other studies Reviewed: Additional studies/ records that were reviewed today with results demonstrating:  prior testing reviewed.   ASSESSMENT AND PLAN:  S/p AVR: Uses Amoxicillin for SBE prophylaxis.  Unclear if his recent episode is related to his valve.  Since his valve is greater than 64 years old, will check echocardiogram.  He does have significant murmur on exam but it looks like this has been documented in the past. CAD: s/p SVG to circ.  No clear angina.  Negative stress test in 2020.  We also discussed stress testing given his recent episode.  Will hold off for now.  He will let us know if symptoms return.  At this point, he thinks it was most likely related to something he ate. Chronic diastolic heart failure: Appears euvolemic.  Continue chlorthalidone. HTN: The current medical regimen is effective;  continue present plan and medications.  He has been adjusting his Edarbi dose at home based on his home blood pressure. Hyperlipidemia: tolerating low dose statin.  LDL 67 in July 2023.   Current medicines are reviewed at length with the patient today.  The patient concerns regarding his medicines were addressed.  The following changes have been made:  No change  Labs/ tests ordered today include:  No orders of the defined types were placed in this encounter.   Recommend 150 minutes/week of aerobic exercise Low fat, low carb, high fiber diet recommended  Disposition:   FU in 1 year   Signed, Larae Grooms, MD  04/13/2022 8:19 AM    [facility] [address], [place], [place]  [address] Phone: [phone]; Fax: [phone]

## 2022-04-13 ENCOUNTER — Encounter: Payer: Self-pay | Admitting: Interventional Cardiology

## 2022-04-13 ENCOUNTER — Ambulatory Visit: Payer: Medicare Other | Attending: Interventional Cardiology | Admitting: Interventional Cardiology

## 2022-04-13 VITALS — BP 130/68 | HR 63 | Ht 70.0 in | Wt 186.0 lb

## 2022-04-13 DIAGNOSIS — I10 Essential (primary) hypertension: Secondary | ICD-10-CM | POA: Diagnosis not present

## 2022-04-13 DIAGNOSIS — I359 Nonrheumatic aortic valve disorder, unspecified: Secondary | ICD-10-CM

## 2022-04-13 DIAGNOSIS — Z952 Presence of prosthetic heart valve: Secondary | ICD-10-CM | POA: Diagnosis not present

## 2022-04-13 DIAGNOSIS — E785 Hyperlipidemia, unspecified: Secondary | ICD-10-CM

## 2022-04-13 DIAGNOSIS — I5032 Chronic diastolic (congestive) heart failure: Secondary | ICD-10-CM | POA: Diagnosis not present

## 2022-04-13 DIAGNOSIS — I2510 Atherosclerotic heart disease of native coronary artery without angina pectoris: Secondary | ICD-10-CM

## 2022-04-13 NOTE — Patient Instructions (Signed)
Medication Instructions:  Your physician recommends that you continue on your current medications as directed. Please refer to the Current Medication list given to you today.  *If you need a refill on your cardiac medications before your next appointment, please call your pharmacy*   Lab Work: none If you have labs (blood work) drawn today and your tests are completely normal, you will receive your results only by: [place] (if you have MyChart) OR A paper copy in the mail If you have any lab test that is abnormal or we need to change your treatment, we will call you to review the results.   Testing/Procedures: Your physician has requested that you have an echocardiogram. Echocardiography is a painless test that uses sound waves to create images of your heart. It provides your doctor with information about the size and shape of your heart and how well your heart's chambers and valves are working. This procedure takes approximately one hour. There are no restrictions for this procedure. Please do NOT wear cologne, perfume, aftershave, or lotions (deodorant is allowed). Please arrive 15 minutes prior to your appointment time.    Follow-Up: At [facility], you and your health needs are our priority.  As part of our continuing mission to provide you with exceptional heart care, we have created designated Provider Care Teams.  These Care Teams include your primary Cardiologist (physician) and Advanced Practice Providers (APPs -  Physician Assistants and Nurse Practitioners) who all work together to provide you with the care you need, when you need it.  We recommend signing up for the patient portal called "MyChart".  Sign up information is provided on this After Visit Summary.  MyChart is used to connect with patients for Virtual Visits (Telemedicine).  Patients are able to view lab/test results, encounter notes, upcoming appointments, etc.  Non-urgent messages can be sent to your  provider as well.   To learn more about what you can do with MyChart, go to [email or web address].    Your next appointment:   6 month(s)  The format for your next appointment:   In Person  Provider:   Larae Grooms, MD     Other Instructions    Important Information About Sugar

## 2022-04-22 DIAGNOSIS — I2510 Atherosclerotic heart disease of native coronary artery without angina pectoris: Secondary | ICD-10-CM | POA: Diagnosis not present

## 2022-04-22 DIAGNOSIS — C61 Malignant neoplasm of prostate: Secondary | ICD-10-CM | POA: Diagnosis not present

## 2022-04-22 DIAGNOSIS — R5383 Other fatigue: Secondary | ICD-10-CM | POA: Diagnosis not present

## 2022-04-22 DIAGNOSIS — M4310 Spondylolisthesis, site unspecified: Secondary | ICD-10-CM | POA: Diagnosis not present

## 2022-04-22 DIAGNOSIS — I119 Hypertensive heart disease without heart failure: Secondary | ICD-10-CM | POA: Diagnosis not present

## 2022-04-22 DIAGNOSIS — Z79899 Other long term (current) drug therapy: Secondary | ICD-10-CM | POA: Diagnosis not present

## 2022-04-22 DIAGNOSIS — I350 Nonrheumatic aortic (valve) stenosis: Secondary | ICD-10-CM | POA: Diagnosis not present

## 2022-04-22 DIAGNOSIS — E119 Type 2 diabetes mellitus without complications: Secondary | ICD-10-CM | POA: Diagnosis not present

## 2022-04-22 DIAGNOSIS — M899 Disorder of bone, unspecified: Secondary | ICD-10-CM | POA: Diagnosis not present

## 2022-04-22 DIAGNOSIS — L209 Atopic dermatitis, unspecified: Secondary | ICD-10-CM | POA: Diagnosis not present

## 2022-04-22 DIAGNOSIS — E7800 Pure hypercholesterolemia, unspecified: Secondary | ICD-10-CM | POA: Diagnosis not present

## 2022-04-22 DIAGNOSIS — N1831 Chronic kidney disease, stage 3a: Secondary | ICD-10-CM | POA: Diagnosis not present

## 2022-05-07 ENCOUNTER — Ambulatory Visit (HOSPITAL_COMMUNITY): Payer: Medicare Other | Attending: Internal Medicine

## 2022-05-07 DIAGNOSIS — I359 Nonrheumatic aortic valve disorder, unspecified: Secondary | ICD-10-CM | POA: Diagnosis not present

## 2022-05-07 DIAGNOSIS — Z952 Presence of prosthetic heart valve: Secondary | ICD-10-CM | POA: Diagnosis not present

## 2022-05-07 DIAGNOSIS — I5032 Chronic diastolic (congestive) heart failure: Secondary | ICD-10-CM | POA: Diagnosis not present

## 2022-05-07 LAB — ECHOCARDIOGRAM COMPLETE
AV Mean grad: 5.7 mmHg
AV Peak grad: 11.4 mmHg
Ao pk vel: 1.69 m/s
Area-P 1/2: 3.6 cm2
MV M vel: 2.56 m/s
MV Peak grad: 26.2 mmHg
S' Lateral: 2.1 cm

## 2022-07-29 DIAGNOSIS — H35033 Hypertensive retinopathy, bilateral: Secondary | ICD-10-CM | POA: Diagnosis not present

## 2022-07-29 DIAGNOSIS — H35373 Puckering of macula, bilateral: Secondary | ICD-10-CM | POA: Diagnosis not present

## 2022-07-29 DIAGNOSIS — H25813 Combined forms of age-related cataract, bilateral: Secondary | ICD-10-CM | POA: Diagnosis not present

## 2022-07-29 DIAGNOSIS — H25811 Combined forms of age-related cataract, right eye: Secondary | ICD-10-CM | POA: Diagnosis not present

## 2022-08-10 DIAGNOSIS — L209 Atopic dermatitis, unspecified: Secondary | ICD-10-CM | POA: Diagnosis not present

## 2022-08-10 DIAGNOSIS — C61 Malignant neoplasm of prostate: Secondary | ICD-10-CM | POA: Diagnosis not present

## 2022-08-10 DIAGNOSIS — M899 Disorder of bone, unspecified: Secondary | ICD-10-CM | POA: Diagnosis not present

## 2022-08-10 DIAGNOSIS — I350 Nonrheumatic aortic (valve) stenosis: Secondary | ICD-10-CM | POA: Diagnosis not present

## 2022-08-10 DIAGNOSIS — M4310 Spondylolisthesis, site unspecified: Secondary | ICD-10-CM | POA: Diagnosis not present

## 2022-08-10 DIAGNOSIS — E119 Type 2 diabetes mellitus without complications: Secondary | ICD-10-CM | POA: Diagnosis not present

## 2022-08-10 DIAGNOSIS — I119 Hypertensive heart disease without heart failure: Secondary | ICD-10-CM | POA: Diagnosis not present

## 2022-08-10 DIAGNOSIS — N1831 Chronic kidney disease, stage 3a: Secondary | ICD-10-CM | POA: Diagnosis not present

## 2022-08-10 DIAGNOSIS — I440 Atrioventricular block, first degree: Secondary | ICD-10-CM | POA: Diagnosis not present

## 2022-08-10 DIAGNOSIS — E7800 Pure hypercholesterolemia, unspecified: Secondary | ICD-10-CM | POA: Diagnosis not present

## 2022-08-10 DIAGNOSIS — Z79899 Other long term (current) drug therapy: Secondary | ICD-10-CM | POA: Diagnosis not present

## 2022-08-10 DIAGNOSIS — R5383 Other fatigue: Secondary | ICD-10-CM | POA: Diagnosis not present

## 2022-08-25 ENCOUNTER — Emergency Department (HOSPITAL_COMMUNITY): Payer: Medicare Other

## 2022-08-25 ENCOUNTER — Emergency Department (HOSPITAL_COMMUNITY)
Admission: EM | Admit: 2022-08-25 | Discharge: 2022-08-25 | Disposition: A | Discharge status: Home / Self Care | Payer: Medicare Other | Attending: Emergency Medicine | Admitting: Emergency Medicine

## 2022-08-25 ENCOUNTER — Encounter (HOSPITAL_COMMUNITY): Payer: Self-pay

## 2022-08-25 ENCOUNTER — Other Ambulatory Visit: Payer: Self-pay

## 2022-08-25 DIAGNOSIS — R10814 Left lower quadrant abdominal tenderness: Secondary | ICD-10-CM | POA: Diagnosis not present

## 2022-08-25 DIAGNOSIS — Z7982 Long term (current) use of aspirin: Secondary | ICD-10-CM | POA: Diagnosis not present

## 2022-08-25 DIAGNOSIS — K5730 Diverticulosis of large intestine without perforation or abscess without bleeding: Secondary | ICD-10-CM | POA: Diagnosis not present

## 2022-08-25 DIAGNOSIS — I5032 Chronic diastolic (congestive) heart failure: Secondary | ICD-10-CM | POA: Insufficient documentation

## 2022-08-25 DIAGNOSIS — M545 Low back pain, unspecified: Secondary | ICD-10-CM | POA: Insufficient documentation

## 2022-08-25 DIAGNOSIS — R109 Unspecified abdominal pain: Secondary | ICD-10-CM | POA: Diagnosis not present

## 2022-08-25 DIAGNOSIS — N183 Chronic kidney disease, stage 3 unspecified: Secondary | ICD-10-CM | POA: Insufficient documentation

## 2022-08-25 DIAGNOSIS — K8020 Calculus of gallbladder without cholecystitis without obstruction: Secondary | ICD-10-CM | POA: Diagnosis not present

## 2022-08-25 DIAGNOSIS — Z8546 Personal history of malignant neoplasm of prostate: Secondary | ICD-10-CM | POA: Insufficient documentation

## 2022-08-25 LAB — COMPREHENSIVE METABOLIC PANEL
ALT: 18 U/L (ref 0–44)
AST: 24 U/L (ref 15–41)
Albumin: 3.9 g/dL (ref 3.5–5.0)
Alkaline Phosphatase: 41 U/L (ref 38–126)
Anion gap: 11 (ref 5–15)
BUN: 18 mg/dL (ref 8–23)
CO2: 25 mmol/L (ref 22–32)
Calcium: 9.5 mg/dL (ref 8.9–10.3)
Chloride: 94 mmol/L — ABNORMAL LOW (ref 98–111)
Creatinine, Ser: 1.35 mg/dL — ABNORMAL HIGH (ref 0.61–1.24)
GFR, Estimated: 52 mL/min — ABNORMAL LOW (ref >60)
Glucose, Bld: 129 mg/dL — ABNORMAL HIGH (ref 70–99)
Potassium: 3.4 mmol/L — ABNORMAL LOW (ref 3.5–5.1)
Sodium: 130 mmol/L — ABNORMAL LOW (ref 135–145)
Total Bilirubin: 1.1 mg/dL (ref 0.3–1.2)
Total Protein: 7 g/dL (ref 6.5–8.1)

## 2022-08-25 LAB — CBC WITH DIFFERENTIAL/PLATELET
Abs Immature Granulocytes: 0.02 10*3/uL (ref 0.00–0.07)
Basophils Absolute: 0.1 10*3/uL (ref 0.0–0.1)
Basophils Relative: 1 %
Eosinophils Absolute: 0.1 10*3/uL (ref 0.0–0.5)
Eosinophils Relative: 2 %
HCT: 43.3 % (ref 39.0–52.0)
Hemoglobin: 14.5 g/dL (ref 13.0–17.0)
Immature Granulocytes: 0 %
Lymphocytes Relative: 22 %
Lymphs Abs: 1.5 10*3/uL (ref 0.7–4.0)
MCH: 28.1 pg (ref 26.0–34.0)
MCHC: 33.5 g/dL (ref 30.0–36.0)
MCV: 83.9 fL (ref 80.0–100.0)
Monocytes Absolute: 0.7 10*3/uL (ref 0.1–1.0)
Monocytes Relative: 10 %
Neutro Abs: 4.6 10*3/uL (ref 1.7–7.7)
Neutrophils Relative %: 65 %
Platelets: 180 10*3/uL (ref 150–400)
RBC: 5.16 MIL/uL (ref 4.22–5.81)
RDW: 13.8 % (ref 11.5–15.5)
WBC: 7.1 10*3/uL (ref 4.0–10.5)
nRBC: 0 % (ref 0.0–0.2)

## 2022-08-25 LAB — URINALYSIS, W/ REFLEX TO CULTURE (INFECTION SUSPECTED)
Bacteria, UA: NONE SEEN
Bilirubin Urine: NEGATIVE
Glucose, UA: NEGATIVE mg/dL
Hgb urine dipstick: NEGATIVE
Ketones, ur: NEGATIVE mg/dL
Leukocytes,Ua: NEGATIVE
Nitrite: NEGATIVE
Protein, ur: NEGATIVE mg/dL
Specific Gravity, Urine: 1.014 (ref 1.005–1.030)
pH: 6 (ref 5.0–8.0)

## 2022-08-25 LAB — LIPASE, BLOOD: Lipase: 35 U/L (ref 11–51)

## 2022-08-25 MED ORDER — KETOROLAC TROMETHAMINE 30 MG/ML IJ SOLN
30.0000 mg | Freq: Once | INTRAMUSCULAR | Status: AC
  Administered 2022-08-25: 30 mg via INTRAMUSCULAR
  Filled 2022-08-25: qty 1

## 2022-08-25 MED ORDER — METHOCARBAMOL 500 MG PO TABS: 500.0000 mg | ORAL_TABLET | Freq: Two times a day (BID) | ORAL | 0 refills | Status: DC | PRN

## 2022-08-25 MED ORDER — LIDOCAINE 5 % EX PTCH: 1.0000 | MEDICATED_PATCH | CUTANEOUS | 0 refills | Status: AC

## 2022-08-25 MED ORDER — LIDOCAINE 5 % EX PTCH
1.0000 | MEDICATED_PATCH | CUTANEOUS | Status: DC
  Administered 2022-08-25: 1 via TRANSDERMAL
  Filled 2022-08-25: qty 1

## 2022-08-25 NOTE — ED Triage Notes (Signed)
Pt came in via POV d/t Lt sided pain that radiates to his back & felt nauseated, denies problem urinating or vomiting. A/Ox4, rates his pain 9/10 while in triage.

## 2022-08-25 NOTE — Discharge Instructions (Addendum)
Note the workup today was overall reassuring.  As discussed, I think that this pain is more muscular.  CT scan of your abdomen/pelvis was negative for any kidney stone or inflammation of your kidney.  Laboratory studies were unremarkable for any acute process.  Will treat at home with numbing patches, continue use of Tylenol. Recommend follow-up with primary care for reassessment of your symptoms.  Please do not hesitate to return to the emergency department for worrisome signs and symptoms we discussed become apparent.

## 2022-08-25 NOTE — ED Provider Triage Note (Signed)
Emergency Medicine Provider Triage Evaluation Note  Talyn Duffner , a 82 y.o. male  was evaluated in triage.  Pt complains of left-sided flank pain with radiation to left groin area..  Patient states the symptoms began Thursday or Friday of last week and have been constant in nature and intermittent in intensity.  States worsening of symptoms last night.  States he feels like he is retaining some urine since symptoms worsened last night.  Reports some associated nausea with no emesis.  Denies hematuria, dysuria, fever, chills, night sweats, change in bowel habits.  Review of Systems  Positive: See above Negative:   Physical Exam  BP 118/71   Pulse 73   Temp 98.2 F (36.8 C)   Resp 18   SpO2 95%  Gen:   Awake, no distress   Resp:  Normal effort  MSK:   Moves extremities without difficulty  Other:  Possible left-sided CVA tenderness.  Medical Decision Making  Medically screening exam initiated at 10:44 AM.  Appropriate orders placed.  Yisroel Hamacher Cobey was informed that the remainder of the evaluation will be completed by another provider, this initial triage assessment does not replace that evaluation, and the importance of remaining in the ED until their evaluation is complete.     Peter Garter, [place] 08/25/22 1045

## 2022-08-25 NOTE — ED Provider Notes (Signed)
[place] EMERGENCY DEPARTMENT AT [facility] Provider Note   CSN: [id number] Arrival date & time: 08/25/22  1018     History  Chief Complaint  Patient presents with   Lt side pain    [name] is a 82 y.o. male.  HPI   82 year old male presents emergency department with complaints of left-sided flank pain with radiation to left groin area.. Patient states the symptoms began Thursday or Friday of last week and have been constant in nature and intermittent in intensity. States worsening of symptoms last night.  Denies any known trauma/falls.  Denies any rash in affected area. Reports some associated nausea with no emesis. Denies hematuria, dysuria, fever, chills, night sweats, change in bowel habits.   Past medical history significant for aortic valve replacement with prosthetic valve, CKD stage III, diastolic CHF, first-degree AV block, PE, BPH, lumbar radiculopathy, prostate malignancy  Home Medications Prior to Admission medications   Medication Sig Start Date End Date Taking? Authorizing Provider  lidocaine (LIDODERM) 5 % Place 1 patch onto the skin daily. Remove & Discard patch within 12 hours or as directed by MD 08/25/22  Yes Sherian Maroon A, PA  amoxicillin (AMOXIL) 500 MG capsule Take 2,000 mg by mouth once. Dental procedure    [provider]  aspirin EC 81 MG tablet Take 81 mg by mouth daily.    [provider]  chlorthalidone (HYGROTON) 25 MG tablet Take 0.5 tablets (12.5 mg total) by mouth every Monday, Wednesday, and Friday. 01/04/20   Lyn Records, MD  Cholecalciferol (VITAMIN D3) 50 MCG (2000 UT) TABS Take 2,000 Units by mouth daily.    [provider]  Choline Fenofibrate (FENOFIBRIC ACID) 45 MG CPDR Take 45 mg by mouth at bedtime. 05/13/15   [provider]  EDARBI 40 MG TABS Take 40 mg by mouth daily. 01/26/22   [provider]  Glycerin-Polysorbate 80 (REFRESH DRY EYE THERAPY OP) Place 1 drop into  both eyes at bedtime as needed (Dry eye).    [provider]  JARDIANCE 10 MG TABS tablet Take 10 mg by mouth daily. 01/19/22   [provider]  meclizine (ANTIVERT) 12.5 MG tablet Take 12.5 mg by mouth daily as needed for dizziness. 10/04/19   [provider]  Multiple Vitamin (MULTIVITAMIN) tablet Take 1 tablet by mouth daily.    [provider]  rosuvastatin (CRESTOR) 5 MG tablet Take 2.5 mg by mouth 3 (three) times a week. 02/23/21   [provider]  solifenacin (VESICARE) 5 MG tablet Take 5 mg by mouth daily.    [provider]  Tamsulosin HCl (FLOMAX) 0.4 MG CAPS Take 0.4 mg by mouth at bedtime.    [provider]  vitamin C (ASCORBIC ACID) 500 MG tablet 1 tablet    [provider]      Allergies    Codeine    Review of Systems   Review of Systems  All other systems reviewed and are negative.   Physical Exam Updated Vital Signs BP 118/71   Pulse 73   Temp 98.2 F (36.8 C)   Resp 18   SpO2 95%  Physical Exam Vitals and nursing note reviewed.  Constitutional:      General: He is not in acute distress.    Appearance: He is well-developed.  HENT:     Head: Normocephalic and atraumatic.  Eyes:     Conjunctiva/sclera: Conjunctivae normal.  Cardiovascular:     Rate and Rhythm:  Normal rate and regular rhythm.     Heart sounds: No murmur heard. Pulmonary:     Effort: Pulmonary effort is normal. No respiratory distress.     Breath sounds: Normal breath sounds.  Abdominal:     Palpations: Abdomen is soft.     Tenderness: There is no abdominal tenderness. There is no guarding or rebound.     Comments: Possible left-sided CVA tenderness versus overlying musculoskeletal tenderness.  Musculoskeletal:        General: No swelling.     Cervical back: Neck supple.     Comments: No midline tenderness of cervical, thoracic spine with no obvious step-off or deformity noted.  Mild tender to palpation in midline of  lumbar region with paraspinal tenderness in the left lumbar region.  Skin:    General: Skin is warm and dry.     Capillary Refill: Capillary refill takes less than 2 seconds.     Comments: No appreciable abnormalities and overlying skin of left flank area.  Neurological:     Mental Status: He is alert.  Psychiatric:        Mood and Affect: Mood normal.     ED Results / Procedures / Treatments   Labs (all labs ordered are listed, but only abnormal results are displayed) Labs Reviewed  COMPREHENSIVE METABOLIC PANEL - Abnormal; Notable for the following components:      Result Value   Sodium 130 (*)    Potassium 3.4 (*)    Chloride 94 (*)    Glucose, Bld 129 (*)    Creatinine, Ser 1.35 (*)    GFR, Estimated 52 (*)    All other components within normal limits  CBC WITH DIFFERENTIAL/PLATELET  LIPASE, BLOOD  URINALYSIS, W/ REFLEX TO CULTURE (INFECTION SUSPECTED)    EKG None  Radiology CT Renal Stone Study  Result Date: 08/25/2022 CLINICAL DATA:  Left flank pain. EXAM: CT ABDOMEN AND PELVIS WITHOUT CONTRAST TECHNIQUE: Multidetector CT imaging of the abdomen and pelvis was performed following the standard protocol without IV contrast. RADIATION DOSE REDUCTION: This exam was performed according to the departmental dose-optimization program which includes automated exposure control, adjustment of the mA and/or kV according to patient size and/or use of iterative reconstruction technique. COMPARISON:  CT abdomen and pelvis 12/02/2015 FINDINGS: Lower chest: Atelectasis or scarring in the right greater than left lower lobes. Unchanged 4 mm left lower lobe nodule which is considered benign with no follow-up imaging recommended. No pleural effusion. Coronary atherosclerosis. Hepatobiliary: No focal liver abnormality on this unenhanced study. Suspected tiny stones in the gallbladder. No pericholecystic inflammation or biliary dilatation. Pancreas: Unremarkable. Spleen: Small calcified granuloma.  Adrenals/Urinary Tract: Unremarkable adrenal glands. No renal calculi or hydronephrosis. Unremarkable bladder. Stomach/Bowel: The stomach is unremarkable. There is mild colonic diverticulosis. There is no evidence acute diverticulitis or bowel obstruction. The appendix is unremarkable. Vascular/Lymphatic: Abdominal aortic atherosclerosis without aneurysm. No enlarged lymph nodes. Reproductive: Prostate brachytherapy seeds. Other: No ascites or pneumoperitoneum. Musculoskeletal: Advanced lumbar facet arthrosis. Multilevel disc degeneration, most severe at L5-S1. Bilateral hip osteoarthrosis with chronic lucencies along the acetabular roofs which may reflect subchondral cysts. IMPRESSION: 1. No acute abnormality identified in the abdomen or pelvis. No urinary tract calculi. 2. Cholelithiasis. 3.  Aortic Atherosclerosis (ICD10-I70.0). Electronically Signed   By: Sebastian Ache M.D.   On: 08/25/2022 11:18    Procedures Procedures    Medications Ordered in ED Medications  lidocaine (LIDODERM) 5 % 1 patch (1 patch Transdermal Patch Applied 08/25/22 1442)  ketorolac (TORADOL)  30 MG/ML injection 30 mg (30 mg Intramuscular Given 08/25/22 1442)    ED Course/ Medical Decision Making/ A&P                             Medical Decision Making Amount and/or Complexity of Data Reviewed Labs: ordered. Radiology: ordered.   This patient presents to the ED for concern of left flank pain, this involves an extensive number of treatment options, and is a complaint that carries with it a high risk of complications and morbidity.  The differential diagnosis includes pyelonephritis, nephrolithiasis, fracture, dislocation, strain/sprain, cauda equina, herpes zoster, cellulitis, erysipelas   Co morbidities that complicate the patient evaluation  See HPI   Additional history obtained:  Additional history obtained from EMR External records from outside source obtained and reviewed including hospital records   Lab  Tests:  I Ordered, and personally interpreted labs.  The pertinent results include: No leukocytosis noted.  No evidence of anemia.  Platelets within normal range.  Mild hyponatremia and hypokalemia and hyperchloremia 130, 3.494 respectively but otherwise, electrolytes within normal limits.  Patient with baseline renal dysfunction with creatinine 1.35, GFR 52 and BUN of 18 from prior laboratory studies performed.  No transaminitis.  Lipase within normal limits.  UA without abnormality.   Imaging Studies ordered:  I ordered imaging studies including CT renal stone study I independently visualized and interpreted imaging which showed no acute abnormalities cholelithiasis without evidence of cholecystitis.  Aortic atherosclerosis.  Patient with degenerative disease disease appreciated on lumbar spine.  Chronic lucencies appreciated and superior aspects of bilateral acetabulum of hip. I agree with the radiologist interpretation   Cardiac Monitoring: / EKG:  The patient was maintained on a cardiac monitor.  I personally viewed and interpreted the cardiac monitored which showed an underlying rhythm of: Sinus rhythm   Consultations Obtained:  I requested consultation with attending physician Dr. Jearld Fenton who independently evaluated the patient was agreement with treatment plan going forward.  Problem List / ED Course / Critical interventions / Medication management  Left flank pain I ordered medication including Toradol, Lidoderm   Reevaluation of the patient after these medicines showed that the patient improved I have reviewed the patients home medicines and have made adjustments as needed   Social Determinants of Health:  Denies tobacco, or drug use   Test / Admission - Considered:  Left flank pain Vitals sign within normal range and stable throughout visit. Laboratory/imaging studies significant for: See above 82 year old male presents emergency department with complaints of left  flank pain.  Patient's workup today overall reassuring.  No evidence of kidney stone, pyelonephritis, cystitis.  Patient with chronic degenerative changes in the lumbar spine as appreciated on CT scan of the abdomen/pelvis.  Patient seems more muscular in the left paraspinal muscles in the lumbar region.  No overlying rash or appreciable/concerning for herpes zoster.  Patient with CKD stable avoid NSAIDs and treatment.  Will treat with topical Lidoderm patches, as well as continues with Tylenol at home along with heat pads.  Patient recommended follow-up with primary care for reassessment of symptoms.  Treatment plan discussed at length with patient and he acknowledged understanding was agreeable to said plan.  Patient overall well-appearing, afebrile in no acute distress. Worrisome signs and symptoms were discussed with the patient, and the patient acknowledged understanding to return to the ED if noticed. Patient was stable upon discharge.          Final  Clinical Impression(s) / ED Diagnoses Final diagnoses:  Left flank pain    Rx / DC Orders ED Discharge Orders          Ordered    lidocaine (LIDODERM) 5 %  Every 24 hours        08/25/22 1333                   Peter Garter, [place] 08/25/22 1447    Loetta Rough, MD 08/25/22 1701

## 2022-08-26 DIAGNOSIS — M899 Disorder of bone, unspecified: Secondary | ICD-10-CM | POA: Diagnosis not present

## 2022-08-26 DIAGNOSIS — C61 Malignant neoplasm of prostate: Secondary | ICD-10-CM | POA: Diagnosis not present

## 2022-08-26 DIAGNOSIS — N1831 Chronic kidney disease, stage 3a: Secondary | ICD-10-CM | POA: Diagnosis not present

## 2022-08-26 DIAGNOSIS — M4310 Spondylolisthesis, site unspecified: Secondary | ICD-10-CM | POA: Diagnosis not present

## 2022-08-26 DIAGNOSIS — I350 Nonrheumatic aortic (valve) stenosis: Secondary | ICD-10-CM | POA: Diagnosis not present

## 2022-08-26 DIAGNOSIS — J209 Acute bronchitis, unspecified: Secondary | ICD-10-CM | POA: Diagnosis not present

## 2022-08-26 DIAGNOSIS — E119 Type 2 diabetes mellitus without complications: Secondary | ICD-10-CM | POA: Diagnosis not present

## 2022-08-26 DIAGNOSIS — I119 Hypertensive heart disease without heart failure: Secondary | ICD-10-CM | POA: Diagnosis not present

## 2022-08-26 DIAGNOSIS — E7800 Pure hypercholesterolemia, unspecified: Secondary | ICD-10-CM | POA: Diagnosis not present

## 2022-08-26 DIAGNOSIS — I440 Atrioventricular block, first degree: Secondary | ICD-10-CM | POA: Diagnosis not present

## 2022-08-26 DIAGNOSIS — L209 Atopic dermatitis, unspecified: Secondary | ICD-10-CM | POA: Diagnosis not present

## 2022-08-26 DIAGNOSIS — Z79899 Other long term (current) drug therapy: Secondary | ICD-10-CM | POA: Diagnosis not present

## 2022-08-27 ENCOUNTER — Ambulatory Visit: Payer: Medicare Other | Attending: Physician Assistant | Admitting: Physician Assistant

## 2022-08-27 VITALS — BP 110/74 | HR 65 | Ht 70.0 in | Wt 184.0 lb

## 2022-08-27 DIAGNOSIS — I5032 Chronic diastolic (congestive) heart failure: Secondary | ICD-10-CM

## 2022-08-27 DIAGNOSIS — I10 Essential (primary) hypertension: Secondary | ICD-10-CM | POA: Insufficient documentation

## 2022-08-27 DIAGNOSIS — I359 Nonrheumatic aortic valve disorder, unspecified: Secondary | ICD-10-CM | POA: Diagnosis not present

## 2022-08-27 DIAGNOSIS — E785 Hyperlipidemia, unspecified: Secondary | ICD-10-CM | POA: Insufficient documentation

## 2022-08-27 DIAGNOSIS — Z952 Presence of prosthetic heart valve: Secondary | ICD-10-CM | POA: Diagnosis not present

## 2022-08-27 DIAGNOSIS — R079 Chest pain, unspecified: Secondary | ICD-10-CM | POA: Diagnosis not present

## 2022-08-27 MED ORDER — NITROGLYCERIN 0.4 MG SL SUBL: 0.4000 mg | SUBLINGUAL_TABLET | SUBLINGUAL | 3 refills | Status: AC | PRN

## 2022-08-27 NOTE — Patient Instructions (Addendum)
Medication Instructions:    START TAKING : NITROGLYCERIN 0.4 MG AS NEEDED FOR CHEST PAIN   *If you need a refill on your cardiac medications before your next appointment, please call your pharmacy*   Lab Work: NONE ORDERED  TODAY   If you have labs (blood work) drawn today and your tests are completely normal, you will receive your results only by: MyChart Message (if you have MyChart) OR A paper copy in the mail If you have any lab test that is abnormal or we need to change your treatment, we will call you to review the results.   Testing/Procedures: NONE ORDERED  TODAY    Follow-Up: At [facility], you and your health needs are our priority.  As part of our continuing mission to provide you with exceptional heart care, we have created designated Provider Care Teams.  These Care Teams include your primary Cardiologist (physician) and Advanced Practice Providers (APPs -  Physician Assistants and Nurse Practitioners) who all work together to provide you with the care you need, when you need it.  We recommend signing up for the patient portal called "MyChart".  Sign up information is provided on this After Visit Summary.  MyChart is used to connect with patients for Virtual Visits (Telemedicine).  Patients are able to view lab/test results, encounter notes, upcoming appointments, etc.  Non-urgent messages can be sent to your provider as well.   To learn more about what you can do with MyChart, go to [email or web address].    Your next appointment:   3 -4 month(s)  Provider:   Lance Muss, MD     Other Instructions

## 2022-08-27 NOTE — Progress Notes (Signed)
Office Visit    Patient Name: [name] Date of Encounter: 08/27/2022  PCP:  Corine Shelter, MD   [facility]  Cardiologist:  Lance Muss, MD  Advanced Practice Provider:  No care team member to display Electrophysiologist:  None   HPI    [name] is a 82 y.o. male with a past medical history of bioprosthetic aortic valve replacement 2013, hypertension, CAD with CABG 2013, bilateral carotid bruits, history of pulmonary emboli and hyperlipidemia presents today for follow-up visit.  The patient had a stress test in 2020 which showed hyperdynamic left ventricular EF (greater than 65%), no ST segment deviation noted during stress, normal study, low risk study.  Echocardiogram 2022 showed LVEF 60 to 65%, no regional wall motion abnormalities.  Moderate concentric left ventricular hypertrophy.  Left ventricle diastolic parameters indeterminate.  Aortic valve has been repaired/replaced and regurgitation is trivial.  Mild dilation of the aorta which was 41 mm.  Last seen 12/23 and was doing well.    Today, he tells me that the chest pain that he is experienced starting last Friday and he thought it was gas. Monday he was in terrible pain  under his left breast. Went to ED for a workup and was told it was MS in nature. Given toradol, tylenol, lidocaine patches. The pain has subsided. He described it as a constant dull pain. He is currently taking Tylenol, mucinex, and cough medicine. He was diagnosed with bronchitis and has been coughing a lot which can also cause chest pain. We discussed deferring an ischemic workup for now but he is to call us if his pain continues after her recovers.    Reports no shortness of breath nor dyspnea on exertion.No edema, orthopnea, PND. Reports no palpitations.    Past Medical History    Past Medical History:  Diagnosis Date   Anxiety    Arthritis    shoulders , back   Back pain    buldging disc    CKD (chronic kidney disease), stage III    Coronary artery disease cardiologist-- dr h. Katrinka Blazing   a. s/p SVG-LCx at time of AVR on 09-02-2011.   Diastolic CHF, chronic    followed by dr h. Katrinka Blazing   First degree heart block    Hemorrhoids    History of palpitations    per pt 05/ 2020 notified his cardiologsit, dr h. Katrinka Blazing, had an episode of palpitations;  dr h. Katrinka Blazing ordered Zio patch montior results 10-17-2018, per dr h. Katrinka Blazing note no signifiant abnormality (01-08-2019 pt stated has not had any palpitations since that one time)   History of pulmonary embolus (PE)    09-11-2011 post op surgery CABG/ AVR on 09-02-2011----bilateral PE and right pleural effusionper pt completed coumadin   Hyperlipidemia    takes Trilipix daily   Hypertension    Nocturia    Pre-diabetes    Prostate cancer urologist-- dr eskridge/  dr Kathrynn Running   dx 07-24-2018,  Stage T1c, Gleason 3+4   S/P aortic valve replacement    S/P aortic valve replacement with prosthetic valve 09/02/2018   bioprosthetic   Wears dentures    full upper and partial lower   Wears glasses    Past Surgical History:  Procedure Laterality Date   AORTIC VALVE REPLACEMENT (AVR)/CORONARY ARTERY BYPASS GRAFTING (CABG)  09-02-2011   dr Laneta Simmers   SVG to LCx and Bioprosthetic aortic valve   BLEPHAROPLASTY Right 2009 approx.   CARDIAC CATHETERIZATION  08-19-2018  dr h. Katrinka Blazing   severe single vessel LCx and severe aortic valve stenosis, normal LVF   COLONOSCOPY     2007/2012   CYST REMOVAL NECK  1970s   benign per pt   ESOPHAGOGASTRODUODENOSCOPY     FLEXIBLE SIGMOIDOSCOPY N/A 03/03/2021   Procedure: FLEXIBLE SIGMOIDOSCOPY;  Surgeon: Charlott Rakes, MD;  Location: [facility];  Service: Endoscopy;  Laterality: N/A;   HOT HEMOSTASIS N/A 03/03/2021   Procedure: HOT HEMOSTASIS (ARGON PLASMA COAGULATION/BICAP);  Surgeon: Charlott Rakes, MD;  Location: [facility];  Service: Endoscopy;  Laterality: N/A;   RADIOACTIVE SEED IMPLANT N/A 01/09/2019    Procedure: RADIOACTIVE SEED IMPLANT/BRACHYTHERAPY IMPLANT;  Surgeon: Jerilee Field, MD;  Location: [facility];  Service: Urology;  Laterality: N/A;  78 seeds implanted cysto per Dr Mena Goes, no seeds found    Allergies  Allergies  Allergen Reactions   Codeine Other (See Comments)    Nervous and jittery.     EKGs/Labs/Other Studies Reviewed:   The following studies were reviewed today:  Echo 05/07/22 IMPRESSIONS     1. Left ventricular ejection fraction, by estimation, is 70 to 75%. The  left ventricle has hyperdynamic function. The left ventricle has no  regional wall motion abnormalities. There is severe concentric left  ventricular hypertrophy. Left ventricular  diastolic parameters were normal.   2. Right ventricular systolic function is normal. The right ventricular  size is normal.   3. The mitral valve is abnormal. Mild mitral valve regurgitation.   4. The aortic valve has been repaired/replaced. Aortic valve  regurgitation is not visualized. There is a bioprosthetic valve present in  the aortic position. Echo findings are consistent with normal structure  and function of the aortic valve prosthesis.  Aortic valve mean gradient measures 5.7 mmHg.   5. Aortic dilatation noted. There is mild dilatation of the ascending  aorta, measuring 41 mm.   Comparison(s): No significant change from prior study. 09/26/2020: LVEF  60-65%, bioprosthetic AVR - mean gradient 6 mmHg, dilated aorta to 41 mm.   FINDINGS   Left Ventricle: Left ventricular ejection fraction, by estimation, is 70  to 75%. The left ventricle has hyperdynamic function. The left ventricle  has no regional wall motion abnormalities. The left ventricular internal  cavity size was normal in size.  There is severe concentric left ventricular hypertrophy. Left ventricular  diastolic parameters were normal.   Right Ventricle: The right ventricular size is normal. No increase in  right  ventricular wall thickness. Right ventricular systolic function is  normal.   Left Atrium: Left atrial size was normal in size.   Right Atrium: Right atrial size was normal in size.   Pericardium: There is no evidence of pericardial effusion.   Mitral Valve: The mitral valve is abnormal. There is mild thickening of  the anterior and posterior mitral valve leaflet(s). Mild mitral valve  regurgitation.   Tricuspid Valve: The tricuspid valve is grossly normal. Tricuspid valve  regurgitation is mild.   Aortic Valve: The aortic valve has been repaired/replaced. Aortic valve  regurgitation is not visualized. Aortic valve mean gradient measures 5.7  mmHg. Aortic valve peak gradient measures 11.4 mmHg. There is a  bioprosthetic valve present in the aortic  position. Echo findings are consistent with normal structure and function  of the aortic valve prosthesis.   Pulmonic Valve: The pulmonic valve was grossly normal. Pulmonic valve  regurgitation is mild.   Aorta: Aortic dilatation noted. There is mild dilatation of the ascending  aorta, measuring 41  mm.   Venous: The inferior vena cava was not well visualized.   IAS/Shunts: No atrial level shunt detected by color flow Doppler.    EKG:  EKG is  ordered today.  The ekg ordered today demonstrates no ischemic changes. NSR rate 65 bpm  Recent Labs: 08/25/2022: ALT 18; BUN 18; Creatinine, Ser 1.35; Hemoglobin 14.5; Platelets 180; Potassium 3.4; Sodium 130  Recent Lipid Panel    Component Value Date/Time   CHOL 105 09/05/2011 0544   TRIG 131 09/05/2011 0544   HDL 23 (L) 09/05/2011 0544   CHOLHDL 4.6 09/05/2011 0544   VLDL 26 09/05/2011 0544   LDLCALC 56 09/05/2011 0544     Home Medications   Current Meds  Medication Sig   amoxicillin (AMOXIL) 500 MG capsule Take 2,000 mg by mouth once. Dental procedure   aspirin EC 81 MG tablet Take 81 mg by mouth daily.   chlorthalidone (HYGROTON) 25 MG tablet Take 0.5 tablets (12.5 mg  total) by mouth every Monday, Wednesday, and Friday.   Cholecalciferol (VITAMIN D3) 50 MCG (2000 UT) TABS Take 2,000 Units by mouth daily.   Choline Fenofibrate (FENOFIBRIC ACID) 45 MG CPDR Take 45 mg by mouth at bedtime.   EDARBI 40 MG TABS Take 40 mg by mouth daily.   Glycerin-Polysorbate 80 (REFRESH DRY EYE THERAPY OP) Place 1 drop into both eyes at bedtime as needed (Dry eye).   JARDIANCE 10 MG TABS tablet Take 10 mg by mouth daily.   lidocaine (LIDODERM) 5 % Place 1 patch onto the skin daily. Remove & Discard patch within 12 hours or as directed by MD   meclizine (ANTIVERT) 12.5 MG tablet Take 12.5 mg by mouth daily as needed for dizziness.   Multiple Vitamin (MULTIVITAMIN) tablet Take 1 tablet by mouth daily.   nitroGLYCERIN (NITROSTAT) 0.4 MG SL tablet Place 1 tablet (0.4 mg total) under the tongue every 5 (five) minutes as needed for chest pain.   rosuvastatin (CRESTOR) 5 MG tablet Take 2.5 mg by mouth 3 (three) times a week.   solifenacin (VESICARE) 5 MG tablet Take 5 mg by mouth daily.   Tamsulosin HCl (FLOMAX) 0.4 MG CAPS Take 0.4 mg by mouth at bedtime.   vitamin C (ASCORBIC ACID) 500 MG tablet 1 tablet     Review of Systems      All other systems reviewed and are otherwise negative except as noted above.  Physical Exam    VS:  BP 110/74   Pulse 65   Ht 5\' 10"  (1.778 m)   Wt 184 lb (83.5 kg)   SpO2 99%   BMI 26.40 kg/m  , BMI Body mass index is 26.4 kg/m.  Wt Readings from Last 3 Encounters:  08/27/22 184 lb (83.5 kg)  04/13/22 186 lb (84.4 kg)  02/01/22 186 lb 0.2 oz (84.4 kg)     GEN: Well nourished, well developed, in no acute distress. HEENT: normal. Neck: Supple, no JVD, carotid bruits, or masses. Cardiac: RRR, no murmurs, rubs, or gallops. No clubbing, cyanosis, edema.  Radials/PT 2+ and equal bilaterally.  Respiratory:  Respirations regular and unlabored, clear to auscultation bilaterally. GI: Soft, nontender, nondistended. MS: No deformity or  atrophy. Skin: Warm and dry, no rash. Neuro:  Strength and sensation are intact. Psych: Normal affect.  Assessment & Plan    S/p AVR -recent echo reviewed -plan to continue current medications: Aspirin 81 mg, chlorthalidone 12.5 mg 3, days a week,  Jardiance 10 mg daily, Crestor 2.5 mg 3 days  a week  CAD/atypical chest pain -recent ED visit reviewed -ischemic w/o negative -continue current medications and conservative treatment -added nitro as needed  Chronic diastolic HF -euvolemic on exam today -continue daily weights -continue current medications  HTN -BP well controlled today 110/74 -continue to monitor at home -continue low sodium, heart healthy diet  HLD -LDL 60 when recently checked -continue crestor       Disposition: Follow up 3-4 months with Lance Muss, MD or APP.  Signed, Sharlene Dory, PA-C 08/27/2022, 4:52 PM [facility]

## 2022-08-30 ENCOUNTER — Telehealth: Payer: Self-pay | Admitting: *Deleted

## 2022-08-30 NOTE — Telephone Encounter (Signed)
        Patient  visited [facility] ed on 08/25/2022  for treatment    Telephone encounter attempt :  2nd  A HIPAA compliant voice message was left requesting a return call.  Instructed patient to call back at [phone]. Yehuda Mao [facility] -[facility] [place], Population [facility] [phone] [address] [place] , [place] [address] Email : [email or web address]

## 2022-09-09 DIAGNOSIS — M25561 Pain in right knee: Secondary | ICD-10-CM | POA: Diagnosis not present

## 2022-10-06 DIAGNOSIS — H268 Other specified cataract: Secondary | ICD-10-CM | POA: Diagnosis not present

## 2022-10-06 DIAGNOSIS — H25811 Combined forms of age-related cataract, right eye: Secondary | ICD-10-CM | POA: Diagnosis not present

## 2022-10-25 ENCOUNTER — Ambulatory Visit: Payer: Medicare Other | Admitting: Interventional Cardiology

## 2022-10-28 DIAGNOSIS — H25812 Combined forms of age-related cataract, left eye: Secondary | ICD-10-CM | POA: Diagnosis not present

## 2022-11-17 DIAGNOSIS — H268 Other specified cataract: Secondary | ICD-10-CM | POA: Diagnosis not present

## 2022-11-17 DIAGNOSIS — H25812 Combined forms of age-related cataract, left eye: Secondary | ICD-10-CM | POA: Diagnosis not present

## 2022-12-19 NOTE — Progress Notes (Deleted)
Cardiology Office Note   Date:  12/19/2022   ID:  [place], Asturias April 21, 1941, MRN [id number]  PCP:  Corine Shelter, MD    No chief complaint on file.  CAD, s/p AVR  Wt Readings from Last 3 Encounters:  08/27/22 184 lb (83.5 kg)  04/13/22 186 lb (84.4 kg)  02/01/22 186 lb 0.2 oz (84.4 kg)       History of Present Illness: [name] is a 82 y.o. male   with a hx of  bioprosthetic aortic valve replacement placed in 2013, hypertension, CAD with CABG 2013, bilateral carotid bruits, and hyperlipidemia.  Postop bilateral pulmonary emboli.   2020 stress test: "The left ventricular ejection fraction is hyperdynamic (>65%). Nuclear stress EF: 69%. There was no ST segment deviation noted during stress. The study is normal. This is a low risk study.   Normal pharmacologic nuclear study with no evidence for prior infarct or ischemia."   2022 echo: "Left ventricular ejection fraction, by estimation, is 60 to 65%. Left  ventricular ejection fraction by 2D MOD biplane is 61.2 %. The left  ventricle has normal function. The left ventricle has no regional wall  motion abnormalities. There is moderate  concentric left ventricular hypertrophy. Left ventricular diastolic  parameters are indeterminate.   2. Right ventricular systolic function is normal. The right ventricular  size is normal. There is normal pulmonary artery systolic pressure.   3. The mitral valve is abnormal. Mild eccentric mitral valve  regurgitation secondary to prolapse.   4. The aortic valve has been repaired/replaced. Aortic valve  regurgitation is trivial. There is a bioprosthetic valve present in the  aortic position. Procedure Date: 09/02/2011. Aortic valve area, by VTI  measures 2.51 cm. Aortic valve mean gradient  measures 6.0 mmHg. Aortic valve Vmax measures 1.83 m/s. Aortic valve  acceleration time measures 66 msec. DVI of 0.55; stable bioprosthetic  valve parameters.   5. Aortic  dilatation noted. There is mild dilatation of the ascending  aorta, measuring 41 mm, though within normal limits for age and BSA.   Comparison(s): A prior study was performed on 06/09/16. Prior images  reviewed side by side. Similar valve parameters from prior. "   In Dec 2023, he had some GERD sx and fatigue.  No vomiting.  He had eaten some chicken that was not well heated.  BP was a little low.  HR was in the 60s.  He decreased his [place].  Energy level improved.   Back problems limit activity.      Past Medical History:  Diagnosis Date   Anxiety    Arthritis    shoulders , back   Back pain    buldging disc   CKD (chronic kidney disease), stage III [facility])    Coronary artery disease cardiologist-- dr h. Katrinka Blazing   a. s/p SVG-LCx at time of AVR on 09-02-2011.   Diastolic CHF, chronic (HCC)    followed by dr h. Katrinka Blazing   First degree heart block    Hemorrhoids    History of palpitations    per pt 05/ 2020 notified his cardiologsit, dr h. Katrinka Blazing, had an episode of palpitations;  dr h. Katrinka Blazing ordered Zio patch montior results 10-17-2018, per dr h. Katrinka Blazing note no signifiant abnormality (01-08-2019 pt stated has not had any palpitations since that one time)   History of pulmonary embolus (PE)    09-11-2011 post op surgery CABG/ AVR on 09-02-2011----bilateral PE and right pleural effusionper pt completed coumadin  Hyperlipidemia    takes Trilipix daily   Hypertension    Nocturia    Pre-diabetes    Prostate cancer [facility]) urologist-- dr eskridge/  dr Kathrynn Running   dx 07-24-2018,  Stage T1c, Gleason 3+4   S/P aortic valve replacement    S/P aortic valve replacement with prosthetic valve 09/02/2018   bioprosthetic   Wears dentures    full upper and partial lower   Wears glasses     Past Surgical History:  Procedure Laterality Date   AORTIC VALVE REPLACEMENT (AVR)/CORONARY ARTERY BYPASS GRAFTING (CABG)  09-02-2011   dr Laneta Simmers   SVG to LCx and Bioprosthetic aortic valve   BLEPHAROPLASTY Right  2009 approx.   CARDIAC CATHETERIZATION  08-19-2018   dr h. Katrinka Blazing   severe single vessel LCx and severe aortic valve stenosis, normal LVF   COLONOSCOPY     2007/2012   CYST REMOVAL NECK  1970s   benign per pt   ESOPHAGOGASTRODUODENOSCOPY     FLEXIBLE SIGMOIDOSCOPY N/A 03/03/2021   Procedure: FLEXIBLE SIGMOIDOSCOPY;  Surgeon: Charlott Rakes, MD;  Location: [facility];  Service: Endoscopy;  Laterality: N/A;   HOT HEMOSTASIS N/A 03/03/2021   Procedure: HOT HEMOSTASIS (ARGON PLASMA COAGULATION/BICAP);  Surgeon: Charlott Rakes, MD;  Location: [facility];  Service: Endoscopy;  Laterality: N/A;   RADIOACTIVE SEED IMPLANT N/A 01/09/2019   Procedure: RADIOACTIVE SEED IMPLANT/BRACHYTHERAPY IMPLANT;  Surgeon: Jerilee Field, MD;  Location: [facility];  Service: Urology;  Laterality: N/A;  78 seeds implanted cysto per Dr Mena Goes, no seeds found     Current Outpatient Medications  Medication Sig Dispense Refill   amoxicillin (AMOXIL) 500 MG capsule Take 2,000 mg by mouth once. Dental procedure     aspirin EC 81 MG tablet Take 81 mg by mouth daily.     chlorthalidone (HYGROTON) 25 MG tablet Take 0.5 tablets (12.5 mg total) by mouth every Monday, Wednesday, and Friday. 20 tablet 3   Cholecalciferol (VITAMIN D3) 50 MCG (2000 UT) TABS Take 2,000 Units by mouth daily.     Choline Fenofibrate (FENOFIBRIC ACID) 45 MG CPDR Take 45 mg by mouth at bedtime.  4   EDARBI 40 MG TABS Take 40 mg by mouth daily.     Glycerin-Polysorbate 80 (REFRESH DRY EYE THERAPY OP) Place 1 drop into both eyes at bedtime as needed (Dry eye).     JARDIANCE 10 MG TABS tablet Take 10 mg by mouth daily.     lidocaine (LIDODERM) 5 % Place 1 patch onto the skin daily. Remove & Discard patch within 12 hours or as directed by MD 30 patch 0   meclizine (ANTIVERT) 12.5 MG tablet Take 12.5 mg by mouth daily as needed for dizziness.     Multiple Vitamin (MULTIVITAMIN) tablet Take 1 tablet by mouth daily.      nitroGLYCERIN (NITROSTAT) 0.4 MG SL tablet Place 1 tablet (0.4 mg total) under the tongue every 5 (five) minutes as needed for chest pain. 25 tablet 3   rosuvastatin (CRESTOR) 5 MG tablet Take 2.5 mg by mouth 3 (three) times a week.     solifenacin (VESICARE) 5 MG tablet Take 5 mg by mouth daily.     Tamsulosin HCl (FLOMAX) 0.4 MG CAPS Take 0.4 mg by mouth at bedtime.     vitamin C (ASCORBIC ACID) 500 MG tablet 1 tablet     No current facility-administered medications for this visit.    Allergies:   Codeine    Social History:  The patient  reports  that he has never smoked. He has never used smokeless tobacco. He reports current alcohol use. He reports that he does not use drugs.   Family History:  The patient's ***family history includes Cancer in his mother; Hypertension in his father and sister; Ovarian cancer in his sister.    ROS:  Please see the history of present illness.   Otherwise, review of systems are positive for ***.   All other systems are reviewed and negative.    PHYSICAL EXAM: VS:  There were no vitals taken for this visit. , BMI There is no height or weight on file to calculate BMI. GEN: Well nourished, well developed, in no acute distress HEENT: normal Neck: no JVD, carotid bruits, or masses Cardiac: ***RRR; no murmurs, rubs, or gallops,no edema  Respiratory:  clear to auscultation bilaterally, normal work of breathing GI: soft, nontender, nondistended, + BS MS: no deformity or atrophy Skin: warm and dry, no rash Neuro:  Strength and sensation are intact Psych: euthymic mood, full affect   EKG:   The ekg ordered today demonstrates ***   Recent Labs: 08/25/2022: ALT 18; BUN 18; Creatinine, Ser 1.35; Hemoglobin 14.5; Platelets 180; Potassium 3.4; Sodium 130   Lipid Panel    Component Value Date/Time   CHOL 105 09/05/2011 0544   TRIG 131 09/05/2011 0544   HDL 23 (L) 09/05/2011 0544   CHOLHDL 4.6 09/05/2011 0544   VLDL 26 09/05/2011 0544   LDLCALC 56  09/05/2011 0544     Other studies Reviewed: Additional studies/ records that were reviewed today with results demonstrating: ***.   ASSESSMENT AND PLAN:  S/p AVR:  CAD: Chronic diastolic heart failure: HTN: Hyperlipidemia:   Current medicines are reviewed at length with the patient today.  The patient concerns regarding his medicines were addressed.  The following changes have been made:  No change***  Labs/ tests ordered today include: *** No orders of the defined types were placed in this encounter.   Recommend 150 minutes/week of aerobic exercise Low fat, low carb, high fiber diet recommended  Disposition:   FU in ***   Signed, Lance Muss, MD  12/19/2022 12:57 PM    [facility] [address] [place], [place], [place]  [address] Phone: [phone]; Fax: [phone]

## 2022-12-21 ENCOUNTER — Encounter: Payer: Self-pay | Admitting: Interventional Cardiology

## 2022-12-21 ENCOUNTER — Ambulatory Visit: Payer: Medicare Other | Attending: Interventional Cardiology | Admitting: Interventional Cardiology

## 2022-12-21 DIAGNOSIS — Z79899 Other long term (current) drug therapy: Secondary | ICD-10-CM | POA: Diagnosis not present

## 2022-12-21 DIAGNOSIS — Z952 Presence of prosthetic heart valve: Secondary | ICD-10-CM

## 2022-12-21 DIAGNOSIS — I10 Essential (primary) hypertension: Secondary | ICD-10-CM

## 2022-12-21 DIAGNOSIS — C61 Malignant neoplasm of prostate: Secondary | ICD-10-CM | POA: Diagnosis not present

## 2022-12-21 DIAGNOSIS — M899 Disorder of bone, unspecified: Secondary | ICD-10-CM | POA: Diagnosis not present

## 2022-12-21 DIAGNOSIS — I2510 Atherosclerotic heart disease of native coronary artery without angina pectoris: Secondary | ICD-10-CM

## 2022-12-21 DIAGNOSIS — I440 Atrioventricular block, first degree: Secondary | ICD-10-CM | POA: Diagnosis not present

## 2022-12-21 DIAGNOSIS — J209 Acute bronchitis, unspecified: Secondary | ICD-10-CM | POA: Diagnosis not present

## 2022-12-21 DIAGNOSIS — I5032 Chronic diastolic (congestive) heart failure: Secondary | ICD-10-CM

## 2022-12-21 DIAGNOSIS — E119 Type 2 diabetes mellitus without complications: Secondary | ICD-10-CM | POA: Diagnosis not present

## 2022-12-21 DIAGNOSIS — J309 Allergic rhinitis, unspecified: Secondary | ICD-10-CM | POA: Diagnosis not present

## 2022-12-21 DIAGNOSIS — M4310 Spondylolisthesis, site unspecified: Secondary | ICD-10-CM | POA: Diagnosis not present

## 2022-12-21 DIAGNOSIS — N1831 Chronic kidney disease, stage 3a: Secondary | ICD-10-CM | POA: Diagnosis not present

## 2022-12-21 DIAGNOSIS — I119 Hypertensive heart disease without heart failure: Secondary | ICD-10-CM | POA: Diagnosis not present

## 2022-12-21 DIAGNOSIS — E785 Hyperlipidemia, unspecified: Secondary | ICD-10-CM

## 2022-12-21 DIAGNOSIS — E7800 Pure hypercholesterolemia, unspecified: Secondary | ICD-10-CM | POA: Diagnosis not present

## 2022-12-21 DIAGNOSIS — I350 Nonrheumatic aortic (valve) stenosis: Secondary | ICD-10-CM | POA: Diagnosis not present

## 2022-12-21 DIAGNOSIS — I359 Nonrheumatic aortic valve disorder, unspecified: Secondary | ICD-10-CM

## 2023-01-18 DIAGNOSIS — M5416 Radiculopathy, lumbar region: Secondary | ICD-10-CM | POA: Diagnosis not present

## 2023-02-24 DIAGNOSIS — Z8546 Personal history of malignant neoplasm of prostate: Secondary | ICD-10-CM | POA: Diagnosis not present

## 2023-03-03 DIAGNOSIS — N401 Enlarged prostate with lower urinary tract symptoms: Secondary | ICD-10-CM | POA: Diagnosis not present

## 2023-03-03 DIAGNOSIS — Z8546 Personal history of malignant neoplasm of prostate: Secondary | ICD-10-CM | POA: Diagnosis not present

## 2023-03-03 DIAGNOSIS — R351 Nocturia: Secondary | ICD-10-CM | POA: Diagnosis not present

## 2023-03-22 NOTE — Progress Notes (Addendum)
 Cardiology Office Note    Patient Name: [name] Date of Encounter: 03/22/2023  Primary Care Provider:  Corine Shelter, MD Primary Cardiologist:  Michael Muss, MD Primary Electrophysiologist: None   Past Medical History    Past Medical History:  Diagnosis Date   Anxiety    Arthritis    shoulders , back   Back pain    buldging disc   CKD (chronic kidney disease), stage III [facility])    Coronary artery disease cardiologist-- dr h. Katrinka Blazing   a. s/p SVG-LCx at time of AVR on 09-02-2011.   Diastolic CHF, chronic (HCC)    followed by dr h. Katrinka Blazing   First degree heart block    Hemorrhoids    History of palpitations    per pt 05/ 2020 notified his cardiologsit, dr h. Katrinka Blazing, had an episode of palpitations;  dr h. Katrinka Blazing ordered Zio patch montior results 10-17-2018, per dr h. Katrinka Blazing note no signifiant abnormality (01-08-2019 pt stated has not had any palpitations since that one time)   History of pulmonary embolus (PE)    09-11-2011 post op surgery CABG/ AVR on 09-02-2011----bilateral PE and right pleural effusionper pt completed coumadin   Hyperlipidemia    takes Trilipix daily   Hypertension    Nocturia    Pre-diabetes    Prostate cancer [facility]) urologist-- dr eskridge/  dr Kathrynn Running   dx 07-24-2018,  Stage T1c, Gleason 3+4   S/P aortic valve replacement    S/P aortic valve replacement with prosthetic valve 09/02/2018   bioprosthetic   Wears dentures    full upper and partial lower   Wears glasses     History of Present Illness  [name] is a 82 y.o. male with PMH of CAD s/p CABG x1 (SVG-OM), and AVR replacement 08/2011 complicated by bilateral PE postop, HTN, HLD, CKD stage III, HFpEF, prostate CA who presents today for follow-up.  [name] was previously followed by both Dr. Katrinka Blazing and [name] and will require new cardiologist moving forward.  He was last seen by Michael Favre, PA on 08/27/2022 after ED visit for chest pain that was found to be  musculoskeletal in nature with negative ACS workup.  He was also diagnosed with bronchitis which contribute to his chest and further ischemic evaluation was deferred at that time.  He was noted to have controlled BP and as needed nitroglycerin was added to current regimen. He was advised regarding ER precautions for chest pain.  During today's visit the patient reports that he has been experiencing bouts of tiredness with occasional balance issues that has been progressively occurring over the past month.  He denies any recurrence of chest pain felt similar to his previous ED visit. He describes the feeling as a drained sensation that makes him want to sit down and rest. The patient denies dizziness but reports a feeling of imbalance during these episodes. He also reports a recent onset of frequent belching, even after drinking water, which is a new symptom. He has been taking chlorthalidone 12.5 mg  daily, although it was initially prescribed for three times a week. The patient's blood pressure is currently on the lower end. The patient also has a history of indigestion and is considering starting medication for it.  Patient denies chest pain, palpitations, dyspnea, PND, orthopnea, nausea, vomiting, dizziness, syncope, edema, weight gain, or early satiety.  Review of Systems  Please see the history of present illness.    All other systems reviewed and are otherwise negative except  as noted above.  Physical Exam    Wt Readings from Last 3 Encounters:  08/27/22 184 lb (83.5 kg)  04/13/22 186 lb (84.4 kg)  02/01/22 186 lb 0.2 oz (84.4 kg)   WU:[id number] were no vitals filed for this visit.,There is no height or weight on file to calculate BMI. GEN: Well nourished, well developed in no acute distress Neck: No JVD; No carotid bruits Pulmonary: Clear to auscultation without rales, wheezing or rhonchi  Cardiovascular: Normal rate. Regular rhythm. Normal S1. Normal S2.   Murmurs: There is no murmur.   ABDOMEN: Soft, non-tender, non-distended EXTREMITIES:  No edema; No deformity   EKG/LABS/ Recent Cardiac Studies   ECG personally reviewed by me today -none completed today  Risk Assessment/Calculations:          Lab Results  Component Value Date   WBC 7.1 08/25/2022   HGB 14.5 08/25/2022   HCT 43.3 08/25/2022   MCV 83.9 08/25/2022   PLT 180 08/25/2022   Lab Results  Component Value Date   CREATININE 1.35 (H) 08/25/2022   BUN 18 08/25/2022   NA 130 (L) 08/25/2022   K 3.4 (L) 08/25/2022   CL 94 (L) 08/25/2022   CO2 25 08/25/2022   Lab Results  Component Value Date   CHOL 105 09/05/2011   HDL 23 (L) 09/05/2011   LDLCALC 56 09/05/2011   TRIG 131 09/05/2011   CHOLHDL 4.6 09/05/2011    Lab Results  Component Value Date   HGBA1C 6.2 (H) 08/30/2011   Assessment & Plan    1.Coronary artery disease: - s/p CABG x1 with AVR in 2013, patient's last Lexiscan Myoview was 2020 and was normal -Today patient reports no episodes of chest pain or angina since previous ED visit. -Continue current GDMT with ASA 81 mg, Crestor 5 mg and Nitrostat 0.4 mg  2. S/P Aortic valve replacement: -Patient's last 2D echo performed 2022 with trivial AVR and well-functioning valve -SBE prophylaxis discussed -Today patient reports no chest pain or shortness of breath -BP well-controlled  3.  Essential hypertension: -Blood pressure controlled but on the lower end (98/58). Patient has been taking Chlorthalidone daily instead of the prescribed three times a week. -Reduce Chlorthalidone to three times a week (Monday, Wednesday, Friday). -Continue Edarbi 20mg  daily.  4.Chronic HFpEF:  -Patient's previous 2D echo was completed in 2023 with hyperdynamic EF of 70-75% and mild MVR and stable aortic prosthesis -Repeat 2D echo  5. Gastroesophageal Reflux Disease (GERD): -New onset belching and indigestion, occurring with all types of food and even with water. No significant changes in diet. -Start  Protonix (Pantoprazole) once daily before meals. -If symptoms persist, consider referral to a gastroenterologist.  6. Fatigue and Weakness -We will check thyroid function, magnesium level, and complete blood count today to rule out potential causes such as hypothyroidism, electrolyte imbalance, or anemia. -Consider referral to Dr. Petra Kuba for further evaluation of B12 and Vitamin D levels if no improvement or if labs return normal.  Disposition: Follow-up with Dr. Clifton Kaipo or APP in 6 months    Signed, Napoleon Form, Leodis Rains, NP 03/22/2023, 10:57 AM [facility]

## 2023-03-23 ENCOUNTER — Ambulatory Visit: Payer: Medicare Other | Attending: Interventional Cardiology | Admitting: Nurse Practitioner

## 2023-03-23 ENCOUNTER — Encounter: Payer: Self-pay | Admitting: Nurse Practitioner

## 2023-03-23 VITALS — BP 98/58 | HR 74 | Ht 70.0 in | Wt 182.2 lb

## 2023-03-23 DIAGNOSIS — I5032 Chronic diastolic (congestive) heart failure: Secondary | ICD-10-CM | POA: Insufficient documentation

## 2023-03-23 DIAGNOSIS — I2510 Atherosclerotic heart disease of native coronary artery without angina pectoris: Secondary | ICD-10-CM | POA: Insufficient documentation

## 2023-03-23 DIAGNOSIS — T732XXD Exhaustion due to exposure, subsequent encounter: Secondary | ICD-10-CM

## 2023-03-23 DIAGNOSIS — Z952 Presence of prosthetic heart valve: Secondary | ICD-10-CM | POA: Insufficient documentation

## 2023-03-23 DIAGNOSIS — E785 Hyperlipidemia, unspecified: Secondary | ICD-10-CM | POA: Diagnosis not present

## 2023-03-23 DIAGNOSIS — T732XXS Exhaustion due to exposure, sequela: Secondary | ICD-10-CM | POA: Insufficient documentation

## 2023-03-23 DIAGNOSIS — K219 Gastro-esophageal reflux disease without esophagitis: Secondary | ICD-10-CM | POA: Insufficient documentation

## 2023-03-23 DIAGNOSIS — I10 Essential (primary) hypertension: Secondary | ICD-10-CM | POA: Insufficient documentation

## 2023-03-23 MED ORDER — PANTOPRAZOLE SODIUM 20 MG PO TBEC: 20.0000 mg | DELAYED_RELEASE_TABLET | Freq: Every day | ORAL | 3 refills | Status: AC

## 2023-03-23 NOTE — Patient Instructions (Addendum)
Medication Instructions:  START Protonix 20mg  Take 1 tablet once a day  *If you need a refill on your cardiac medications before your next appointment, please call your pharmacy*   Lab Work: TODAY-BMET, CBC, TSH, MAG If you have labs (blood work) drawn today and your tests are completely normal, you will receive your results only by: MyChart Message (if you have MyChart) OR A paper copy in the mail If you have any lab test that is abnormal or we need to change your treatment, we will call you to review the results.   Testing/Procedures: NONE ORDERED   Follow-Up: At [facility], you and your health needs are our priority.  As part of our continuing mission to provide you with exceptional heart care, we have created designated Provider Care Teams.  These Care Teams include your primary Cardiologist (physician) and Advanced Practice Providers (APPs -  Physician Assistants and Nurse Practitioners) who all work together to provide you with the care you need, when you need it.  We recommend signing up for the patient portal called "MyChart".  Sign up information is provided on this After Visit Summary.  MyChart is used to connect with patients for Virtual Visits (Telemedicine).  Patients are able to view lab/test results, encounter notes, upcoming appointments, etc.  Non-urgent messages can be sent to your provider as well.   To learn more about what you can do with MyChart, go to [email or web address].    Your next appointment:   6 month(s)  Provider:   Verne Carrow, MD     Other Instructions  PREP Program handout

## 2023-03-24 LAB — CBC
Hematocrit: 40.1 % (ref 37.5–51.0)
Hemoglobin: 13.2 g/dL (ref 13.0–17.7)
MCH: 28.8 pg (ref 26.6–33.0)
MCHC: 32.9 g/dL (ref 31.5–35.7)
MCV: 88 fL (ref 79–97)
Platelets: 175 10*3/uL (ref 150–450)
RBC: 4.58 x10E6/uL (ref 4.14–5.80)
RDW: 12.7 % (ref 11.6–15.4)
WBC: 4.5 10*3/uL (ref 3.4–10.8)

## 2023-03-24 LAB — MAGNESIUM: Magnesium: 2.1 mg/dL (ref 1.6–2.3)

## 2023-03-24 LAB — BASIC METABOLIC PANEL
BUN/Creatinine Ratio: 13 (ref 10–24)
BUN: 17 mg/dL (ref 8–27)
CO2: 28 mmol/L (ref 20–29)
Calcium: 9.5 mg/dL (ref 8.6–10.2)
Chloride: 99 mmol/L (ref 96–106)
Creatinine, Ser: 1.31 mg/dL — ABNORMAL HIGH (ref 0.76–1.27)
Glucose: 79 mg/dL (ref 70–99)
Potassium: 4.2 mmol/L (ref 3.5–5.2)
Sodium: 137 mmol/L (ref 134–144)
eGFR: 54 mL/min/{1.73_m2} — ABNORMAL LOW (ref >59)

## 2023-03-24 LAB — TSH: TSH: 1.68 u[IU]/mL (ref 0.450–4.500)

## 2023-04-25 DIAGNOSIS — J209 Acute bronchitis, unspecified: Secondary | ICD-10-CM | POA: Diagnosis not present

## 2023-04-25 DIAGNOSIS — N1831 Chronic kidney disease, stage 3a: Secondary | ICD-10-CM | POA: Diagnosis not present

## 2023-04-25 DIAGNOSIS — J3089 Other allergic rhinitis: Secondary | ICD-10-CM | POA: Diagnosis not present

## 2023-04-25 DIAGNOSIS — I350 Nonrheumatic aortic (valve) stenosis: Secondary | ICD-10-CM | POA: Diagnosis not present

## 2023-04-25 DIAGNOSIS — E7800 Pure hypercholesterolemia, unspecified: Secondary | ICD-10-CM | POA: Diagnosis not present

## 2023-04-25 DIAGNOSIS — M4310 Spondylolisthesis, site unspecified: Secondary | ICD-10-CM | POA: Diagnosis not present

## 2023-04-25 DIAGNOSIS — Z0001 Encounter for general adult medical examination with abnormal findings: Secondary | ICD-10-CM | POA: Diagnosis not present

## 2023-04-25 DIAGNOSIS — E119 Type 2 diabetes mellitus without complications: Secondary | ICD-10-CM | POA: Diagnosis not present

## 2023-04-25 DIAGNOSIS — Z1331 Encounter for screening for depression: Secondary | ICD-10-CM | POA: Diagnosis not present

## 2023-04-25 DIAGNOSIS — Z79899 Other long term (current) drug therapy: Secondary | ICD-10-CM | POA: Diagnosis not present

## 2023-04-25 DIAGNOSIS — L820 Inflamed seborrheic keratosis: Secondary | ICD-10-CM | POA: Diagnosis not present

## 2023-04-25 DIAGNOSIS — C61 Malignant neoplasm of prostate: Secondary | ICD-10-CM | POA: Diagnosis not present

## 2023-07-14 ENCOUNTER — Telehealth: Payer: Self-pay | Admitting: Nurse Practitioner

## 2023-07-14 DIAGNOSIS — I359 Nonrheumatic aortic valve disorder, unspecified: Secondary | ICD-10-CM

## 2023-07-14 DIAGNOSIS — Z952 Presence of prosthetic heart valve: Secondary | ICD-10-CM

## 2023-07-14 DIAGNOSIS — R5383 Other fatigue: Secondary | ICD-10-CM

## 2023-07-14 NOTE — Telephone Encounter (Signed)
 Pt is feeling very fatigued. He said he is not having any other symptoms

## 2023-07-14 NOTE — Telephone Encounter (Signed)
 no voicemail, unable to leave message

## 2023-07-18 NOTE — Telephone Encounter (Signed)
Pt aware of recommendations and agrees ./cy 

## 2023-07-18 NOTE — Telephone Encounter (Signed)
 Spoke with pt and has noted fatigue off an on for a couple of months no other symptoms Per pt no problems sleeping and no  bleeding noted Per pt had valve replaced 10 years ago and is wondering if needs to be done again Pt has appt with Dr Clifton Kagen in May Will forward to Robin Searing NP for review and recommendations ./cy

## 2023-08-03 ENCOUNTER — Ambulatory Visit: Admitting: Podiatry

## 2023-08-17 ENCOUNTER — Ambulatory Visit: Admitting: Podiatry

## 2023-08-17 DIAGNOSIS — M2021 Hallux rigidus, right foot: Secondary | ICD-10-CM

## 2023-08-17 DIAGNOSIS — M7751 Other enthesopathy of right foot: Secondary | ICD-10-CM

## 2023-08-17 DIAGNOSIS — R21 Rash and other nonspecific skin eruption: Secondary | ICD-10-CM | POA: Insufficient documentation

## 2023-08-17 DIAGNOSIS — H04123 Dry eye syndrome of bilateral lacrimal glands: Secondary | ICD-10-CM | POA: Insufficient documentation

## 2023-08-17 DIAGNOSIS — L818 Other specified disorders of pigmentation: Secondary | ICD-10-CM | POA: Insufficient documentation

## 2023-08-17 DIAGNOSIS — M609 Myositis, unspecified: Secondary | ICD-10-CM | POA: Insufficient documentation

## 2023-08-17 NOTE — Progress Notes (Signed)
 Subjective:  Patient ID: [name], male    DOB: 10-30-1940,  MRN: [id number]  Chief Complaint  Patient presents with   Foot Pain    Pt stated that he is here to get an injection in his right big toe    83 y.o. male presents with the above complaint.  Patient presents with right first metatarsophalangeal joint pain with underlying arthritis.  Patient states painful touch is progressive and worse worse with ambulation especially has not seen anyone else prior to seeing me for this.  He normally gets injections in the joint due to underlying hallux rigidus pain scale 7 out of 10 dull achy in nature.   Review of Systems: Negative except as noted in the HPI. Denies N/V/F/Ch.  Past Medical History:  Diagnosis Date   Anxiety    Arthritis    shoulders , back   Back pain    buldging disc   CKD (chronic kidney disease), stage III [facility])    Coronary artery disease cardiologist-- dr h. Katrinka Blazing   a. s/p SVG-LCx at time of AVR on 09-02-2011.   Diastolic CHF, chronic (HCC)    followed by dr h. Katrinka Blazing   First degree heart block    Hemorrhoids    History of palpitations    per pt 05/ 2020 notified his cardiologsit, dr h. Katrinka Blazing, had an episode of palpitations;  dr h. Katrinka Blazing ordered Zio patch montior results 10-17-2018, per dr h. Katrinka Blazing note no signifiant abnormality (01-08-2019 pt stated has not had any palpitations since that one time)   History of pulmonary embolus (PE)    09-11-2011 post op surgery CABG/ AVR on 09-02-2011----bilateral PE and right pleural effusionper pt completed coumadin   Hyperlipidemia    takes Trilipix daily   Hypertension    Nocturia    Pre-diabetes    Prostate cancer [facility]) urologist-- dr eskridge/  dr Kathrynn Running   dx 07-24-2018,  Stage T1c, Gleason 3+4   S/P aortic valve replacement    S/P aortic valve replacement with prosthetic valve 09/02/2018   bioprosthetic   Wears dentures    full upper and partial lower   Wears glasses     Current Outpatient  Medications:    azithromycin (ZITHROMAX) 250 MG tablet, Take by mouth., Disp: , Rfl:    chlorpheniramine-HYDROcodone (TUSSIONEX) 10-8 MG/5ML, TAKE 5CC EVERY 12 HOURS FOR COUGH, Disp: , Rfl:    MIEBO 1.338 GM/ML SOLN, , Disp: , Rfl:    amoxicillin (AMOXIL) 500 MG capsule, Take 2,000 mg by mouth once. Dental procedure, Disp: , Rfl:    aspirin EC 81 MG tablet, Take 81 mg by mouth daily., Disp: , Rfl:    chlorthalidone (HYGROTON) 25 MG tablet, Take 0.5 tablets (12.5 mg total) by mouth every Monday, Wednesday, and Friday., Disp: 20 tablet, Rfl: 3   Cholecalciferol (VITAMIN D3) 50 MCG (2000 UT) TABS, Take 2,000 Units by mouth daily., Disp: , Rfl:    Choline Fenofibrate (FENOFIBRIC ACID) 45 MG CPDR, Take 45 mg by mouth at bedtime., Disp: , Rfl: 4   EDARBI 40 MG TABS, Take 20 mg by mouth daily., Disp: , Rfl:    Glycerin-Polysorbate 80 (REFRESH DRY EYE THERAPY OP), Place 1 drop into both eyes at bedtime as needed (Dry eye)., Disp: , Rfl:    JARDIANCE 10 MG TABS tablet, Take 10 mg by mouth daily., Disp: , Rfl:    lidocaine (LIDODERM) 5 %, Place 1 patch onto the skin daily. Remove & Discard patch within 12 hours or  as directed by MD, Disp: 30 patch, Rfl: 0   meclizine (ANTIVERT) 12.5 MG tablet, Take 12.5 mg by mouth daily as needed for dizziness., Disp: , Rfl:    Multiple Vitamin (MULTIVITAMIN) tablet, Take 1 tablet by mouth daily., Disp: , Rfl:    nitroGLYCERIN (NITROSTAT) 0.4 MG SL tablet, Place 1 tablet (0.4 mg total) under the tongue every 5 (five) minutes as needed for chest pain., Disp: 25 tablet, Rfl: 3   pantoprazole (PROTONIX) 20 MG tablet, Take 1 tablet (20 mg total) by mouth daily., Disp: 30 tablet, Rfl: 3   rosuvastatin (CRESTOR) 5 MG tablet, Take 2.5 mg by mouth 3 (three) times a week., Disp: , Rfl:    solifenacin (VESICARE) 5 MG tablet, Take 5 mg by mouth daily., Disp: , Rfl:    Tamsulosin HCl (FLOMAX) 0.4 MG CAPS, Take 0.4 mg by mouth at bedtime., Disp: , Rfl:    vitamin C (ASCORBIC ACID) 500  MG tablet, Take 500 mg by mouth daily., Disp: , Rfl:   Social History   Tobacco Use  Smoking Status Never  Smokeless Tobacco Never    Allergies  Allergen Reactions   Codeine Other (See Comments)    Nervous and jittery.   Objective:  There were no vitals filed for this visit. There is no height or weight on file to calculate BMI. Constitutional Well developed. Well nourished.  Vascular Dorsalis pedis pulses palpable bilaterally. Posterior tibial pulses palpable bilaterally. Capillary refill normal to all digits.  No cyanosis or clubbing noted. Pedal hair growth normal.  Neurologic Normal speech. Oriented to person, place, and time. Epicritic sensation to light touch grossly present bilaterally.  Dermatologic Nails well groomed and normal in appearance. No open wounds. No skin lesions.  Orthopedic: Pain on palpation right first metatarsophalangeal joint pain with range of motion of the joint deep intra-articular pain noted some crepitus noted.   Radiographs: None Assessment:   1. Capsulitis of metatarsophalangeal (MTP) joint of right foot   2. Hallux rigidus, right foot    Plan:  Patient was evaluated and treated and all questions answered.  Right hallux capsulitis/great toe capsulitis with underlying hallux rigidus - All questions and concerns were discussed with the patient in extensive detail given the amount of pain that he is having he will benefit from steroid injection of decrease inflammatory component surgical pain.  Patient agrees with plan like to proceed with steroid injection -A steroid injection was performed at right first metatarsophalangeal joint using 1% plain Lidocaine and 10 mg of Kenalog. This was well tolerated. - Shoe gear modification discussed - I briefly discussed surgical options with first metatarsophalangeal arthroplasty versus MPJ fusion.  He will get back to me when it is worsening   No follow-ups on file.

## 2023-08-18 ENCOUNTER — Ambulatory Visit (HOSPITAL_COMMUNITY): Attending: Internal Medicine

## 2023-08-18 DIAGNOSIS — I7121 Aneurysm of the ascending aorta, without rupture: Secondary | ICD-10-CM | POA: Insufficient documentation

## 2023-08-18 DIAGNOSIS — I340 Nonrheumatic mitral (valve) insufficiency: Secondary | ICD-10-CM | POA: Insufficient documentation

## 2023-08-18 DIAGNOSIS — R5383 Other fatigue: Secondary | ICD-10-CM | POA: Diagnosis present

## 2023-08-18 DIAGNOSIS — Z48812 Encounter for surgical aftercare following surgery on the circulatory system: Secondary | ICD-10-CM | POA: Insufficient documentation

## 2023-08-18 DIAGNOSIS — Z952 Presence of prosthetic heart valve: Secondary | ICD-10-CM | POA: Diagnosis not present

## 2023-08-18 DIAGNOSIS — I359 Nonrheumatic aortic valve disorder, unspecified: Secondary | ICD-10-CM | POA: Diagnosis not present

## 2023-08-18 LAB — ECHOCARDIOGRAM COMPLETE
Area-P 1/2: 3.59 cm2
MV M vel: 3.77 m/s
MV Peak grad: 56.9 mmHg
MV VTI: 1.99 cm2
S' Lateral: 2.2 cm

## 2023-08-19 ENCOUNTER — Other Ambulatory Visit: Payer: Self-pay

## 2023-08-19 DIAGNOSIS — R5383 Other fatigue: Secondary | ICD-10-CM

## 2023-08-19 DIAGNOSIS — I359 Nonrheumatic aortic valve disorder, unspecified: Secondary | ICD-10-CM

## 2023-08-19 DIAGNOSIS — I2510 Atherosclerotic heart disease of native coronary artery without angina pectoris: Secondary | ICD-10-CM

## 2023-08-19 DIAGNOSIS — I10 Essential (primary) hypertension: Secondary | ICD-10-CM

## 2023-08-19 DIAGNOSIS — I5032 Chronic diastolic (congestive) heart failure: Secondary | ICD-10-CM

## 2023-08-19 DIAGNOSIS — Z952 Presence of prosthetic heart valve: Secondary | ICD-10-CM

## 2023-09-19 ENCOUNTER — Ambulatory Visit: Payer: Medicare Other | Attending: Cardiovascular Disease | Admitting: Cardiovascular Disease

## 2023-09-19 ENCOUNTER — Encounter: Payer: Self-pay | Admitting: Cardiovascular Disease

## 2023-09-19 VITALS — BP 112/70 | HR 82 | Ht 70.0 in | Wt 179.4 lb

## 2023-09-19 DIAGNOSIS — Z952 Presence of prosthetic heart valve: Secondary | ICD-10-CM

## 2023-09-19 DIAGNOSIS — E785 Hyperlipidemia, unspecified: Secondary | ICD-10-CM | POA: Diagnosis not present

## 2023-09-19 DIAGNOSIS — I2510 Atherosclerotic heart disease of native coronary artery without angina pectoris: Secondary | ICD-10-CM | POA: Diagnosis not present

## 2023-09-19 DIAGNOSIS — I10 Essential (primary) hypertension: Secondary | ICD-10-CM | POA: Diagnosis not present

## 2023-09-19 DIAGNOSIS — I340 Nonrheumatic mitral (valve) insufficiency: Secondary | ICD-10-CM

## 2023-09-19 NOTE — Progress Notes (Signed)
 Chief Complaint  Patient presents with   Follow-up    CAD   History of Present Illness: 83 yo male [name] ([name])with history of CAD s/p 1V CABG in 2013, AVR in 2013, prior PE, HTN, HLD, CKD stage 3, chronic diastolic CHF and prostate cancer here today for follow up. He had been followed in our office by Dr. Felipe Horton and Dr. [name]. He had 1V CABG in 2013 with SVG to OM and had AVR (27 mm MagnaEase bioprosthetic valve) at that time. Echo April 2025 with LVEF=60-65%. Moderate mitral regurgitation. Normally functioning AVR. Cardiac monitor September 2023 with sinus and PVCs.   He is here today for follow up. The patient denies any chest pain, dyspnea, palpitations, lower extremity edema, orthopnea, PND, dizziness, near syncope or syncope.   Primary Care Physician: [name], MD   Past Medical History:  Diagnosis Date   Anxiety    Arthritis    shoulders , back   Back pain    buldging disc   CKD (chronic kidney disease), stage III [facility])    Coronary artery disease cardiologist-- dr h. Felipe Horton   a. s/p SVG-LCx at time of AVR on 09-02-2011.   Diastolic CHF, chronic (HCC)    followed by dr h. Felipe Horton   First degree heart block    Hemorrhoids    History of palpitations    per pt 05/ 2020 notified his cardiologsit, dr h. Felipe Horton, had an episode of palpitations;  dr h. Felipe Horton ordered Zio patch montior results 10-17-2018, per dr h. Felipe Horton note no signifiant abnormality (01-08-2019 pt stated has not had any palpitations since that one time)   History of pulmonary embolus (PE)    09-11-2011 post op surgery CABG/ AVR on 09-02-2011----bilateral PE and right pleural effusionper pt completed coumadin   Hyperlipidemia    takes Trilipix daily   Hypertension    Nocturia    Pre-diabetes    Prostate cancer [facility]) urologist-- dr eskridge/  dr Lorri Rota   dx 07-24-2018,  Stage T1c, Gleason 3+4   S/P aortic valve replacement    S/P aortic valve replacement with prosthetic valve 09/02/2018    bioprosthetic   Wears dentures    full upper and partial lower   Wears glasses     Past Surgical History:  Procedure Laterality Date   AORTIC VALVE REPLACEMENT (AVR)/CORONARY ARTERY BYPASS GRAFTING (CABG)  09-02-2011   dr Sherene Dilling   SVG to LCx and Bioprosthetic aortic valve   BLEPHAROPLASTY Right 2009 approx.   CARDIAC CATHETERIZATION  08-19-2018   dr h. Felipe Horton   severe single vessel LCx and severe aortic valve stenosis, normal LVF   COLONOSCOPY     2007/2012   CYST REMOVAL NECK  1970s   benign per pt   ESOPHAGOGASTRODUODENOSCOPY     FLEXIBLE SIGMOIDOSCOPY N/A 03/03/2021   Procedure: FLEXIBLE SIGMOIDOSCOPY;  Surgeon: Baldo Bonds, MD;  Location: [facility];  Service: Endoscopy;  Laterality: N/A;   HOT HEMOSTASIS N/A 03/03/2021   Procedure: HOT HEMOSTASIS (ARGON PLASMA COAGULATION/BICAP);  Surgeon: Baldo Bonds, MD;  Location: [facility];  Service: Endoscopy;  Laterality: N/A;   RADIOACTIVE SEED IMPLANT N/A 01/09/2019   Procedure: RADIOACTIVE SEED IMPLANT/BRACHYTHERAPY IMPLANT;  Surgeon: Christina Coyer, MD;  Location: [facility];  Service: Urology;  Laterality: N/A;  78 seeds implanted cysto per Dr Derrick Fling, no seeds found    Current Outpatient Medications  Medication Sig Dispense Refill   ammonium lactate (LAC-HYDRIN) 12 % lotion Apply 1 Application topically as needed.  amoxicillin  (AMOXIL ) 500 MG capsule Take 2,000 mg by mouth once. Dental procedure     aspirin  EC 81 MG tablet Take 81 mg by mouth daily.     azithromycin (ZITHROMAX) 250 MG tablet Take by mouth.     chlorpheniramine-HYDROcodone  (TUSSIONEX) 10-8 MG/5ML TAKE 5CC EVERY 12 HOURS FOR COUGH     chlorthalidone  (HYGROTON ) 25 MG tablet Take 0.5 tablets (12.5 mg total) by mouth every Monday, Wednesday, and Friday. 20 tablet 3   Cholecalciferol  (VITAMIN D3) 50 MCG (2000 UT) TABS Take 2,000 Units by mouth daily.     Choline Fenofibrate  (FENOFIBRIC ACID) 45 MG CPDR Take 45 mg by mouth at bedtime.   4   clotrimazole (LOTRIMIN) 1 % cream Apply 1 Application topically 2 (two) times daily.     EDARBI 40 MG TABS Take 20 mg by mouth daily.     fluocinonide cream (LIDEX) 0.05 % Apply 1 Application topically 2 (two) times daily.     Glycerin-Polysorbate 80 (REFRESH DRY EYE THERAPY OP) Place 1 drop into both eyes at bedtime as needed (Dry eye).     JARDIANCE 10 MG TABS tablet Take 10 mg by mouth daily.     lidocaine  (LIDODERM ) 5 % Place 1 patch onto the skin daily. Remove & Discard patch within 12 hours or as directed by MD 30 patch 0   meclizine  (ANTIVERT ) 12.5 MG tablet Take 12.5 mg by mouth daily as needed for dizziness.     MIEBO 1.338 GM/ML SOLN      Multiple Vitamin (MULTIVITAMIN) tablet Take 1 tablet by mouth daily.     nitroGLYCERIN  (NITROSTAT ) 0.4 MG SL tablet Place 1 tablet (0.4 mg total) under the tongue every 5 (five) minutes as needed for chest pain. 25 tablet 3   pantoprazole  (PROTONIX ) 20 MG tablet Take 1 tablet (20 mg total) by mouth daily. 30 tablet 3   rosuvastatin (CRESTOR) 5 MG tablet Take 2.5 mg by mouth 3 (three) times a week.     solifenacin (VESICARE) 5 MG tablet Take 5 mg by mouth daily.     Tamsulosin  HCl (FLOMAX ) 0.4 MG CAPS Take 0.4 mg by mouth at bedtime.     vitamin C  (ASCORBIC ACID ) 500 MG tablet Take 500 mg by mouth daily.     No current facility-administered medications for this visit.    Allergies  Allergen Reactions   Codeine Other (See Comments)    Nervous and jittery.    Social History   Socioeconomic History   Marital status: Married    Spouse name: Not on file   Number of children: Not on file   Years of education: Not on file   Highest education level: Not on file  Occupational History   Occupation: retired  Tobacco Use   Smoking status: Never   Smokeless tobacco: Never  Vaping Use   Vaping status: Never Used  Substance and Sexual Activity   Alcohol  use: Yes    Comment: wine occasional   Drug use: No   Sexual activity: Yes  Other  Topics Concern   Not on file  Social History Narrative   Not on file   Social Drivers of Health   Financial Resource Strain: Not on file  Food Insecurity: Not on file  Transportation Needs: No Transportation Needs (09/15/2018)   PRAPARE - Administrator, Civil Service (Medical): No    Lack of Transportation (Non-Medical): No  Physical Activity: Not on file  Stress: Not on file  Social Connections: Not  on file  Intimate Partner Violence: Not on file    Family History  Problem Relation Age of Onset   Hypertension Father        CABG at 99    Cancer Mother    Hypertension Sister    Ovarian cancer Sister    Anesthesia problems Neg Hx    Hypotension Neg Hx    Malignant hyperthermia Neg Hx    Pseudochol deficiency Neg Hx     Review of Systems:  As stated in the HPI and otherwise negative.   BP 112/70   Pulse 82   Ht 5\' 10"  (1.778 m)   Wt 81.4 kg   SpO2 97%   BMI 25.74 kg/m   Physical Examination: General: Well developed, well nourished, NAD  HEENT: OP clear, mucus membranes moist  SKIN: warm, dry. No rashes. Neuro: No focal deficits  Musculoskeletal: Muscle strength 5/5 all ext  Psychiatric: Mood and affect normal  Neck: No JVD, no carotid bruits, no thyromegaly, no lymphadenopathy.  Lungs:Clear bilaterally, no wheezes, rhonci, crackles Cardiovascular: Regular rate and rhythm. Soft systolic murmur.  Abdomen:Soft. Bowel sounds present. Non-tender.  Extremities: No lower extremity edema. Pulses are 2 + in the bilateral DP/PT.  EKG:  EKG is ordered today. The ekg ordered today demonstrates  EKG Interpretation Date/Time:  Monday Sep 19 2023 10:32:04 EDT Ventricular Rate:  76 PR Interval:  306 QRS Duration:  86 QT Interval:  406 QTC Calculation: 456 R Axis:   6  Text Interpretation: Sinus rhythm with 1st degree A-V block Confirmed by Antoinette Batman [phone]) on 09/19/2023 10:42:27 AM   Recent Labs: 03/23/2023: BUN 17; Creatinine, Ser 1.31;  Hemoglobin 13.2; Magnesium  2.1; Platelets 175; Potassium 4.2; Sodium 137; TSH 1.680   Lipid Panel    Component Value Date/Time   CHOL 105 09/05/2011 0544   TRIG 131 09/05/2011 0544   HDL 23 (L) 09/05/2011 0544   CHOLHDL 4.6 09/05/2011 0544   VLDL 26 09/05/2011 0544   LDLCALC 56 09/05/2011 0544   Wt Readings from Last 3 Encounters:  09/19/23 81.4 kg  03/23/23 82.6 kg  08/27/22 83.5 kg    Assessment and Plan:   1. CAD s/p CABG without angina:  No chest pain suggestive of angina. Continue ASA and Crestor  2. Mitral regurgitation: Moderate by echo April 2025. Repeat echo April 2026.   3. HTN:BP is well controlled. Continue chorthalidone.   4. HLD: LDL near goal in August 2024. Continue Crestor  5. PVCs: No palpitations  6. Aortic valve disease: AVR working well by echo in April 2025. Continue ASA. SBE proph as needed.   Labs/ tests ordered today include:   Orders Placed This Encounter  Procedures   EKG 12-Lead     Disposition:   F/U with me in one year    Signed, Antoinette Batman, MD, [facility] 09/19/2023 11:05 AM    [facility] [address] [place], [place], [place]  [address] Phone: [phone]; Fax: [phone]

## 2023-09-19 NOTE — Patient Instructions (Signed)
 Medication Instructions:  No changes *If you need a refill on your cardiac medications before your next appointment, please call your pharmacy*  Lab Work: none If you have labs (blood work) drawn today and your tests are completely normal, you will receive your results only by: MyChart Message (if you have MyChart) OR A paper copy in the mail If you have any lab test that is abnormal or we need to change your treatment, we will call you to review the results.  Testing/Procedures: none  Follow-Up: At [facility], you and your health needs are our priority.  As part of our continuing mission to provide you with exceptional heart care, our providers are all part of one team.  This team includes your primary Cardiologist (physician) and Advanced Practice Providers or APPs (Physician Assistants and Nurse Practitioners) who all work together to provide you with the care you need, when you need it.  Your next appointment:   12 month(s)  Provider:   Antoinette Batman, MD

## 2023-11-10 ENCOUNTER — Telehealth: Payer: Self-pay | Admitting: Cardiovascular Disease

## 2023-11-10 NOTE — Telephone Encounter (Signed)
 Most recent echo routed via Epic fax to PCP office. Confirmed fax number.

## 2023-11-10 NOTE — Telephone Encounter (Signed)
 Pt's PCP calling to request hat pt's most recent Echo results be sent to their office. Please advise

## 2024-02-20 ENCOUNTER — Ambulatory Visit (HOSPITAL_COMMUNITY)

## 2024-04-03 ENCOUNTER — Ambulatory Visit (HOSPITAL_COMMUNITY)
Admission: RE | Admit: 2024-04-03 | Discharge: 2024-04-03 | Disposition: A | Discharge status: Home / Self Care | Source: Ambulatory Visit | Attending: Internal Medicine | Admitting: Internal Medicine

## 2024-04-03 DIAGNOSIS — I2510 Atherosclerotic heart disease of native coronary artery without angina pectoris: Secondary | ICD-10-CM | POA: Insufficient documentation

## 2024-04-03 DIAGNOSIS — R5383 Other fatigue: Secondary | ICD-10-CM | POA: Insufficient documentation

## 2024-04-03 DIAGNOSIS — Z952 Presence of prosthetic heart valve: Secondary | ICD-10-CM | POA: Insufficient documentation

## 2024-04-03 DIAGNOSIS — I359 Nonrheumatic aortic valve disorder, unspecified: Secondary | ICD-10-CM | POA: Diagnosis present

## 2024-04-03 DIAGNOSIS — I10 Essential (primary) hypertension: Secondary | ICD-10-CM | POA: Diagnosis not present

## 2024-04-03 DIAGNOSIS — I5032 Chronic diastolic (congestive) heart failure: Secondary | ICD-10-CM | POA: Insufficient documentation

## 2024-04-03 LAB — ECHOCARDIOGRAM COMPLETE
AR max vel: 1.92 cm2
AV Area VTI: 1.95 cm2
AV Area mean vel: 1.9 cm2
AV Mean grad: 7 mmHg
AV Peak grad: 14.4 mmHg
Ao pk vel: 1.9 m/s
Area-P 1/2: 3.47 cm2
MV M vel: 3.69 m/s
MV Peak grad: 54.5 mmHg
S' Lateral: 2.2 cm

## 2024-04-04 ENCOUNTER — Ambulatory Visit (HOSPITAL_COMMUNITY): Payer: Self-pay | Admitting: Nurse Practitioner

## 2024-04-09 ENCOUNTER — Telehealth: Payer: Self-pay | Admitting: Nurse Practitioner

## 2024-04-09 NOTE — Telephone Encounter (Signed)
 Spoke w/patient about echo results. Advised f/u appt is needed. Will reach out to primary cardiologist/nurse for work in?

## 2024-04-09 NOTE — Telephone Encounter (Signed)
 I spoke with patient and scheduled him to see Dr Verlin on 12/10 at 2:40

## 2024-04-09 NOTE — Telephone Encounter (Signed)
Pt returning call regarding Echo results. Please advise

## 2024-04-09 NOTE — Telephone Encounter (Signed)
 Pt is returning call to a nurse for results

## 2024-04-09 NOTE — Telephone Encounter (Signed)
 [name], CMA 04/09/2024 12:07 PM EST     Left message for the patient to contact office to discuss test results.   Jackee VEAR Wyn Mickey., NP 04/04/2024  8:45 AM EST     Please let patient know that his 2D echo shows moderate to severe mitral regurgitation with moderate prolapse of anterior leaflet and annular calcification.  There was stable heart squeezing with severe LVH secondary to valve disorder.  The previous AVR is stable with no evidence of stenosis and review regurgitation.     Please schedule patient a follow-up visit with APP or his primary cardiologist to discuss results further and arrange for TEE for further evaluation of MR.   Jackee Wyn, NP

## 2024-04-12 ENCOUNTER — Ambulatory Visit: Admitting: Podiatry

## 2024-04-12 DIAGNOSIS — M2021 Hallux rigidus, right foot: Secondary | ICD-10-CM | POA: Diagnosis not present

## 2024-04-12 DIAGNOSIS — M7751 Other enthesopathy of right foot: Secondary | ICD-10-CM | POA: Diagnosis not present

## 2024-04-12 MED ORDER — TRIAMCINOLONE ACETONIDE 10 MG/ML IJ SUSP
10.0000 mg | Freq: Once | INTRAMUSCULAR | Status: AC
  Administered 2024-04-12: 10 mg

## 2024-04-12 NOTE — Patient Instructions (Signed)
Hallux Limitus Hallux limitus is a condition involving pain and a loss of motion of the first (big) toe. The pain gets worse with lifting up (extension) of the toe. This is usually due to arthritic bony bumps (spurring) of the joint at the base of the big toe.  SYMPTOMS   Pain, with lifting up of the toe.  Tenderness over the joint where the big toe meets the foot.  Redness, swelling, and warmth over the top of the base of the big toe (sometimes).  Foot pain, stiffness, and limping. CAUSES  Hallux limitus is caused by arthritis of the joint where the big toe meets the foot. The arthritis creates a bone spur that pinches the soft tissues when the toe is extended. RISK INCREASES WITH:  Tight shoes with a narrow toe box.  Family history of foot problems.  Gout and rheumatoid and psoriatic arthritis.  History of previous toe injury, including "turf toe."  Long first toe, flat feet, and other big toe bony bumps.  Arthritis of the big toe. PREVENTION   Wear wide-toed shoes that fit well.  Tape the big toe to reduce motion and to prevent pinching of the tissues between the bone.  Maintain physical fitness:  Foot and ankle flexibility.  Muscle strength and endurance. PROGNOSIS  This condition can usually be managed with proper treatment. However, surgery is typically required to prevent the problem from recurring.  RELATED COMPLICATIONS  Injury to other areas of the foot or ankle, caused by abnormal walking in an attempt to avoid the pain felt when walking normally. TREATMENT Treatment first involves stopping the activities that aggravate your symptoms. Ice and medicine can be used to reduce the pain and inflammation. Modifications to shoes may help reduce pain, including wearing stiff-soled shoes, shoes with a wide toe box, inserting a padded donut to relieve pressure on top of the joint, or wearing an arch support. Corticosteroid injections may be given to reduce inflammation. If  nonsurgical treatment is unsuccessful, surgery may be needed. Surgical options include removing the arthritic bony spur, cutting a bone in the foot to change the arc of motion (allowing the toe to extend more), or fusion of the joint (eliminating all motion in the joint at the base of the big toe).  MEDICATION   If pain medicine is needed, nonsteroidal anti-inflammatory medicines (aspirin and ibuprofen), or other minor pain relievers (acetaminophen), are often advised.  Do not take pain medicine for 7 days before surgery.  Prescription pain relievers are usually prescribed only after surgery. Use only as directed and only as much as you need.  Ointments for arthritis, applied to the skin, may give some relief.  Injections of corticosteroids may be given to reduce inflammation. HEAT AND COLD  Cold treatment (icing) relieves pain and reduces inflammation. Cold treatment should be applied for 10 to 15 minutes every 2 to 3 hours, and immediately after activity that aggravates your symptoms. Use ice packs or an ice massage.  Heat treatment may be used before performing the stretching and strengthening activities prescribed by your caregiver, physical therapist, or athletic trainer. Use a heat pack or a warm water soak. SEEK MEDICAL CARE IF:   Symptoms get worse or do not improve in 2 weeks, despite treatment.  After surgery you develop fever, increasing pain, redness, swelling, drainage of fluids, bleeding, or increasing warmth.  New, unexplained symptoms develop.    

## 2024-04-12 NOTE — Progress Notes (Signed)
 Subjective:  Patient ID: [name], male    DOB: 1940/08/03,  MRN: [id number]  Chief Complaint  Patient presents with   Ingrown Toenail    Capsulitis of metatarsophalangeal (MTP) joint of right foot, Hallux rigidus, right foot    83 y.o. male presents with the above complaint.  Patient presents with right first metatarsophalangeal joint pain with underlying arthritis.  Last had injections in this area August 17, 2023 and has done pretty well for him to this point.  Pain has recurred and he reports dull, throbbing, aching pain to the area.  Reports pain approximately 7-8 out of 10   Review of Systems: Negative except as noted in the HPI. Denies N/V/F/Ch.  Past Medical History:  Diagnosis Date   Anxiety    Arthritis    shoulders , back   Back pain    buldging disc   CKD (chronic kidney disease), stage III [facility])    Coronary artery disease cardiologist-- dr h. claudene   a. s/p SVG-LCx at time of AVR on 09-02-2011.   Diastolic CHF, chronic (HCC)    followed by dr h. claudene   First degree heart block    Hemorrhoids    History of palpitations    per pt 05/ 2020 notified his cardiologsit, dr h. claudene, had an episode of palpitations;  dr h. claudene ordered Zio patch montior results 10-17-2018, per dr h. claudene note no signifiant abnormality (01-08-2019 pt stated has not had any palpitations since that one time)   History of pulmonary embolus (PE)    09-11-2011 post op surgery CABG/ AVR on 09-02-2011----bilateral PE and right pleural effusionper pt completed coumadin   Hyperlipidemia    takes Trilipix daily   Hypertension    Nocturia    Pre-diabetes    Prostate cancer [facility]) urologist-- dr eskridge/  dr patrcia   dx 07-24-2018,  Stage T1c, Gleason 3+4   S/P aortic valve replacement    S/P aortic valve replacement with prosthetic valve 09/02/2018   bioprosthetic   Wears dentures    full upper and partial lower   Wears glasses     Current Outpatient Medications:    ammonium  lactate (LAC-HYDRIN) 12 % lotion, Apply 1 Application topically as needed., Disp: , Rfl:    amoxicillin  (AMOXIL ) 500 MG capsule, Take 2,000 mg by mouth once. Dental procedure, Disp: , Rfl:    aspirin  EC 81 MG tablet, Take 81 mg by mouth daily., Disp: , Rfl:    azithromycin (ZITHROMAX) 250 MG tablet, Take by mouth., Disp: , Rfl:    chlorpheniramine-HYDROcodone  (TUSSIONEX) 10-8 MG/5ML, TAKE 5CC EVERY 12 HOURS FOR COUGH, Disp: , Rfl:    chlorthalidone  (HYGROTON ) 25 MG tablet, Take 0.5 tablets (12.5 mg total) by mouth every Monday, Wednesday, and Friday., Disp: 20 tablet, Rfl: 3   Cholecalciferol  (VITAMIN D3) 50 MCG (2000 UT) TABS, Take 2,000 Units by mouth daily., Disp: , Rfl:    Choline Fenofibrate  (FENOFIBRIC ACID) 45 MG CPDR, Take 45 mg by mouth at bedtime., Disp: , Rfl: 4   clotrimazole (LOTRIMIN) 1 % cream, Apply 1 Application topically 2 (two) times daily., Disp: , Rfl:    EDARBI 40 MG TABS, Take 20 mg by mouth daily., Disp: , Rfl:    fluocinonide cream (LIDEX) 0.05 %, Apply 1 Application topically 2 (two) times daily., Disp: , Rfl:    Glycerin-Polysorbate 80 (REFRESH DRY EYE THERAPY OP), Place 1 drop into both eyes at bedtime as needed (Dry eye)., Disp: , Rfl:  JARDIANCE 10 MG TABS tablet, Take 10 mg by mouth daily., Disp: , Rfl:    lidocaine  (LIDODERM ) 5 %, Place 1 patch onto the skin daily. Remove & Discard patch within 12 hours or as directed by MD, Disp: 30 patch, Rfl: 0   meclizine  (ANTIVERT ) 12.5 MG tablet, Take 12.5 mg by mouth daily as needed for dizziness., Disp: , Rfl:    MIEBO 1.338 GM/ML SOLN, , Disp: , Rfl:    Multiple Vitamin (MULTIVITAMIN) tablet, Take 1 tablet by mouth daily., Disp: , Rfl:    nitroGLYCERIN  (NITROSTAT ) 0.4 MG SL tablet, Place 1 tablet (0.4 mg total) under the tongue every 5 (five) minutes as needed for chest pain., Disp: 25 tablet, Rfl: 3   pantoprazole  (PROTONIX ) 20 MG tablet, Take 1 tablet (20 mg total) by mouth daily., Disp: 30 tablet, Rfl: 3   rosuvastatin  (CRESTOR) 5 MG tablet, Take 2.5 mg by mouth 3 (three) times a week., Disp: , Rfl:    solifenacin (VESICARE) 5 MG tablet, Take 5 mg by mouth daily., Disp: , Rfl:    Tamsulosin  HCl (FLOMAX ) 0.4 MG CAPS, Take 0.4 mg by mouth at bedtime., Disp: , Rfl:    vitamin C  (ASCORBIC ACID ) 500 MG tablet, Take 500 mg by mouth daily., Disp: , Rfl:   Social History   Tobacco Use  Smoking Status Never  Smokeless Tobacco Never    Allergies  Allergen Reactions   Codeine Other (See Comments)    Nervous and jittery.   Objective:  There were no vitals filed for this visit. There is no height or weight on file to calculate BMI. Constitutional Well developed. Well nourished.  Vascular Dorsalis pedis pulses palpable bilaterally. Posterior tibial pulses palpable bilaterally. Capillary refill normal to all digits.  No cyanosis or clubbing noted. Pedal hair growth normal.  Neurologic Normal speech. Oriented to person, place, and time. Epicritic sensation to light touch grossly present bilaterally.  Dermatologic Nails well groomed and normal in appearance. No open wounds. No skin lesions.  Orthopedic: Pain on palpation right first metatarsophalangeal joint pain with range of motion of the joint deep intra-articular pain noted some crepitus noted.  Range of motion right first MPJ noted to be decreased in dorsiflexion.   Radiographs: None Assessment:   1. Hallux rigidus, right foot   2. Capsulitis of metatarsophalangeal (MTP) joint of right foot    Plan:  Patient was evaluated and treated and all questions answered.  Right hallux capsulitis/great toe capsulitis with underlying hallux rigidus - All questions and concerns were discussed with the patient in extensive detail given the amount of pain that he is having he will benefit from steroid injection of decrease inflammatory component and pain.  Patient agrees with plan like to proceed with steroid injection -A steroid injection was performed at right  first metatarsophalangeal joint using 0.5% % plain Marcaine and 10 mg of Kenalog . This was well tolerated follow Betadine skin prep - Shoe gear modification discussed.  I recommend that he obtain over-the-counter Morton's extension inserts to limit first MPJ range of motion with gait. - We did discuss potential surgical options with first MPJ arthrodesis versus arthroplasty if pain recalcitrant.  I advised that he follow-up with Dr. Tobie in the coming weeks if pain recurs to discuss potential surgery if interested   Return if symptoms worsen or fail to improve, for hallux limitus.

## 2024-04-16 ENCOUNTER — Encounter: Payer: Self-pay | Admitting: Podiatry

## 2024-04-18 ENCOUNTER — Ambulatory Visit: Attending: Cardiovascular Disease | Admitting: Cardiovascular Disease

## 2024-04-18 VITALS — BP 111/73 | HR 74 | Ht 70.0 in | Wt 180.0 lb

## 2024-04-18 DIAGNOSIS — Z952 Presence of prosthetic heart valve: Secondary | ICD-10-CM

## 2024-04-18 DIAGNOSIS — E7800 Pure hypercholesterolemia, unspecified: Secondary | ICD-10-CM | POA: Diagnosis not present

## 2024-04-18 DIAGNOSIS — I340 Nonrheumatic mitral (valve) insufficiency: Secondary | ICD-10-CM

## 2024-04-18 DIAGNOSIS — I359 Nonrheumatic aortic valve disorder, unspecified: Secondary | ICD-10-CM | POA: Diagnosis not present

## 2024-04-18 DIAGNOSIS — I2510 Atherosclerotic heart disease of native coronary artery without angina pectoris: Secondary | ICD-10-CM | POA: Diagnosis not present

## 2024-04-18 DIAGNOSIS — I10 Essential (primary) hypertension: Secondary | ICD-10-CM | POA: Diagnosis not present

## 2024-04-18 NOTE — Patient Instructions (Signed)
 Medication Instructions:  Your physician recommends that you continue on your current medications as directed. Please refer to the Current Medication list given to you today.  *If you need a refill on your cardiac medications before your next appointment, please call your pharmacy*  Lab Work: none If you have labs (blood work) drawn today and your tests are completely normal, you will receive your results only by: MyChart Message (if you have MyChart) OR A paper copy in the mail If you have any lab test that is abnormal or we need to change your treatment, we will call you to review the results.  Testing/Procedures: Your physician has requested that you have an echocardiogram in late November/early December 2026. Echocardiography is a painless test that uses sound waves to create images of your heart. It provides your doctor with information about the size and shape of your heart and how well your hearts chambers and valves are working. This procedure takes approximately one hour. There are no restrictions for this procedure. Please do NOT wear cologne, perfume, aftershave, or lotions (deodorant is allowed). Please arrive 15 minutes prior to your appointment time.  Please note: We ask at that you not bring children with you during ultrasound (echo/ vascular) testing. Due to room size and safety concerns, children are not allowed in the ultrasound rooms during exams. Our front office staff cannot provide observation of children in our lobby area while testing is being conducted. An adult accompanying a patient to their appointment will only be allowed in the ultrasound room at the discretion of the ultrasound technician under special circumstances. We apologize for any inconvenience.   Follow-Up: At [facility], you and your health needs are our priority.  As part of our continuing mission to provide you with exceptional heart care, our providers are all part of one team.  This team  includes your primary Cardiologist (physician) and Advanced Practice Providers or APPs (Physician Assistants and Nurse Practitioners) who all work together to provide you with the care you need, when you need it.  Your next appointment:   12 month(s)  Provider:   Lonni Cash, MD    We recommend signing up for the patient portal called MyChart.  Sign up information is provided on this After Visit Summary.  MyChart is used to connect with patients for Virtual Visits (Telemedicine).  Patients are able to view lab/test results, encounter notes, upcoming appointments, etc.  Non-urgent messages can be sent to your provider as well.   To learn more about what you can do with MyChart, go to [email or web address].   Other Instructions

## 2024-04-18 NOTE — Progress Notes (Signed)
 Chief Complaint  Patient presents with   Follow-up    CAD   History of Present Illness: 83 yo male [name] ([name])with history of CAD s/p 1V CABG in 2013, AVR in 2013, prior PE, HTN, HLD, PVCs, CKD stage 3 and prostate cancer here today for follow up. He had been followed in our office by Dr. Claudene and Dr. Dann. He had 1V CABG in 2013 with SVG to OM and had AVR (27 mm MagnaEase bioprosthetic valve) at that time. Cardiac monitor September 2023 with sinus and PVCs. Echo April 2025 with LVEF=60-65%. Moderate mitral regurgitation. Normally functioning AVR. Echo November 2025 with LVEF=60-65%. Severe LVH. Moderate to severe mitral regurgitation.   He is here today for follow up. The patient denies any chest pain, dyspnea, palpitations, lower extremity edema, orthopnea, PND, dizziness, near syncope or syncope.   Primary Care Physician: Hillman Bare, MD   Past Medical History:  Diagnosis Date   Anxiety    Arthritis    shoulders , back   Back pain    buldging disc   CKD (chronic kidney disease), stage III [facility])    Coronary artery disease cardiologist-- dr h. claudene   a. s/p SVG-LCx at time of AVR on 09-02-2011.   Diastolic CHF, chronic (HCC)    followed by dr h. claudene   First degree heart block    Hemorrhoids    History of palpitations    per pt 05/ 2020 notified his cardiologsit, dr h. claudene, had an episode of palpitations;  dr h. claudene ordered Zio patch montior results 10-17-2018, per dr h. claudene note no signifiant abnormality (01-08-2019 pt stated has not had any palpitations since that one time)   History of pulmonary embolus (PE)    09-11-2011 post op surgery CABG/ AVR on 09-02-2011----bilateral PE and right pleural effusionper pt completed coumadin   Hyperlipidemia    takes Trilipix daily   Hypertension    Nocturia    Pre-diabetes    Prostate cancer [facility]) urologist-- dr eskridge/  dr patrcia   dx 07-24-2018,  Stage T1c, Gleason 3+4   S/P aortic valve  replacement    S/P aortic valve replacement with prosthetic valve 09/02/2018   bioprosthetic   Wears dentures    full upper and partial lower   Wears glasses     Past Surgical History:  Procedure Laterality Date   AORTIC VALVE REPLACEMENT (AVR)/CORONARY ARTERY BYPASS GRAFTING (CABG)  09-02-2011   dr lucas   SVG to LCx and Bioprosthetic aortic valve   BLEPHAROPLASTY Right 2009 approx.   CARDIAC CATHETERIZATION  08-19-2018   dr h. claudene   severe single vessel LCx and severe aortic valve stenosis, normal LVF   COLONOSCOPY     2007/2012   CYST REMOVAL NECK  1970s   benign per pt   ESOPHAGOGASTRODUODENOSCOPY     FLEXIBLE SIGMOIDOSCOPY N/A 03/03/2021   Procedure: FLEXIBLE SIGMOIDOSCOPY;  Surgeon: Dianna Specking, MD;  Location: [facility];  Service: Endoscopy;  Laterality: N/A;   HOT HEMOSTASIS N/A 03/03/2021   Procedure: HOT HEMOSTASIS (ARGON PLASMA COAGULATION/BICAP);  Surgeon: Dianna Specking, MD;  Location: [facility];  Service: Endoscopy;  Laterality: N/A;   RADIOACTIVE SEED IMPLANT N/A 01/09/2019   Procedure: RADIOACTIVE SEED IMPLANT/BRACHYTHERAPY IMPLANT;  Surgeon: Nieves Cough, MD;  Location: [facility];  Service: Urology;  Laterality: N/A;  78 seeds implanted cysto per Dr Nieves, no seeds found    Current Outpatient Medications  Medication Sig Dispense Refill   ammonium lactate (LAC-HYDRIN)  12 % lotion Apply 1 Application topically as needed.     amoxicillin  (AMOXIL ) 500 MG capsule Take 2,000 mg by mouth once. Dental procedure     aspirin  EC 81 MG tablet Take 81 mg by mouth daily.     chlorpheniramine-HYDROcodone  (TUSSIONEX) 10-8 MG/5ML TAKE 5CC EVERY 12 HOURS FOR COUGH     chlorthalidone  (HYGROTON ) 25 MG tablet Take 0.5 tablets (12.5 mg total) by mouth every Monday, Wednesday, and Friday. 20 tablet 3   Cholecalciferol  (VITAMIN D3) 50 MCG (2000 UT) TABS Take 2,000 Units by mouth daily.     Choline Fenofibrate  (FENOFIBRIC ACID) 45 MG CPDR Take 45  mg by mouth at bedtime.  4   clotrimazole (LOTRIMIN) 1 % cream Apply 1 Application topically 2 (two) times daily.     fluocinonide cream (LIDEX) 0.05 % Apply 1 Application topically 2 (two) times daily.     fluticasone (CUTIVATE) 0.005 % ointment Apply topically 2 (two) times daily.     Glycerin-Polysorbate 80 (REFRESH DRY EYE THERAPY OP) Place 1 drop into both eyes at bedtime as needed (Dry eye).     JARDIANCE 10 MG TABS tablet Take 10 mg by mouth daily.     lidocaine  (LIDODERM ) 5 % Place 1 patch onto the skin daily. Remove & Discard patch within 12 hours or as directed by MD 30 patch 0   meclizine  (ANTIVERT ) 12.5 MG tablet Take 12.5 mg by mouth daily as needed for dizziness.     Multiple Vitamin (MULTIVITAMIN) tablet Take 1 tablet by mouth daily.     nitroGLYCERIN  (NITROSTAT ) 0.4 MG SL tablet Place 1 tablet (0.4 mg total) under the tongue every 5 (five) minutes as needed for chest pain. 25 tablet 3   pantoprazole  (PROTONIX ) 20 MG tablet Take 1 tablet (20 mg total) by mouth daily. 30 tablet 3   rosuvastatin (CRESTOR) 5 MG tablet Take 2.5 mg by mouth 3 (three) times a week.     solifenacin (VESICARE) 5 MG tablet Take 5 mg by mouth daily.     Tamsulosin  HCl (FLOMAX ) 0.4 MG CAPS Take 0.4 mg by mouth at bedtime.     vitamin C  (ASCORBIC ACID ) 500 MG tablet Take 500 mg by mouth daily.     azithromycin (ZITHROMAX) 250 MG tablet Take by mouth.     EDARBI 40 MG TABS Take 20 mg by mouth daily.     MIEBO 1.338 GM/ML SOLN      No current facility-administered medications for this visit.    Allergies  Allergen Reactions   Codeine Other (See Comments)    Nervous and jittery.    Social History   Socioeconomic History   Marital status: Married    Spouse name: Not on file   Number of children: Not on file   Years of education: Not on file   Highest education level: Not on file  Occupational History   Occupation: retired  Tobacco Use   Smoking status: Never   Smokeless tobacco: Never  Vaping  Use   Vaping status: Never Used  Substance and Sexual Activity   Alcohol  use: Yes    Comment: wine occasional   Drug use: No   Sexual activity: Yes  Other Topics Concern   Not on file  Social History Narrative   Not on file   Social Drivers of Health   Financial Resource Strain: Not on file  Food Insecurity: Not on file  Transportation Needs: No Transportation Needs (09/15/2018)   PRAPARE - Transportation  Lack of Transportation (Medical): No    Lack of Transportation (Non-Medical): No  Physical Activity: Not on file  Stress: Not on file  Social Connections: Not on file  Intimate Partner Violence: Not At Risk (12/21/2023)   Received from [facility]   HITS    Over the last 12 months how often did your partner physically hurt you?: Never    Over the last 12 months how often did your partner insult you or talk down to you?: Never    Over the last 12 months how often did your partner threaten you with physical harm?: Never    Over the last 12 months how often did your partner scream or curse at you?: Never    Family History  Problem Relation Age of Onset   Hypertension Father        CABG at 64    Cancer Mother    Hypertension Sister    Ovarian cancer Sister    Anesthesia problems Neg Hx    Hypotension Neg Hx    Malignant hyperthermia Neg Hx    Pseudochol deficiency Neg Hx     Review of Systems:  As stated in the HPI and otherwise negative.   BP 111/73 (BP Location: Right Arm)   Pulse 74   Ht 5' 10 (1.778 m)   Wt 180 lb (81.6 kg)   SpO2 96%   BMI 25.83 kg/m   Physical Examination: General: Well developed, well nourished, NAD  HEENT: OP clear, mucus membranes moist  SKIN: warm, dry. No rashes. Neuro: No focal deficits  Musculoskeletal: Muscle strength 5/5 all ext  Psychiatric: Mood and affect normal  Neck: No JVD, no carotid bruits, no thyromegaly, no lymphadenopathy.  Lungs:Clear bilaterally, no wheezes, rhonci, crackles Cardiovascular: Regular rate and  rhythm. Systolic murmur.  Abdomen:Soft. Bowel sounds present. Non-tender.  Extremities: No lower extremity edema. Pulses are 2 + in the bilateral DP/PT.  EKG:  EKG is not ordered today. The ekg ordered today demonstrates   Recent Labs: No results found for requested labs within last 365 days.   Lipid Panel    Component Value Date/Time   CHOL 105 09/05/2011 0544   TRIG 131 09/05/2011 0544   HDL 23 (L) 09/05/2011 0544   CHOLHDL 4.6 09/05/2011 0544   VLDL 26 09/05/2011 0544   LDLCALC 56 09/05/2011 0544   Wt Readings from Last 3 Encounters:  04/18/24 180 lb (81.6 kg)  09/19/23 179 lb 6.4 oz (81.4 kg)  03/23/23 182 lb 3.2 oz (82.6 kg)    Assessment and Plan:   1. CAD s/p CABG without angina:  No chest pain.  -Continue ASA and Crestor.   2. Mitral regurgitation: Moderate to severe MR by echo November 2025. He has normal LV function. He has no symptoms. No evidence of CHF. Repeat echo one year.    3. HTN: BP is controlled.  -Continue chlorthalidone .    4. HLD: LDL near goal in January 2025.  -Continue Crestor.   5. PVCs: No palpitations.   6. Aortic valve disease s/p bioprosthetic AVR: AVR working well by echo in November 2025.  -Continue ASA. Continue SBE prophylaxis as needed.   Labs/ tests ordered today include:   Orders Placed This Encounter  Procedures   ECHOCARDIOGRAM COMPLETE     Disposition:   F/U with me in one year    Signed, Lonni Cash, MD, [place] 04/18/2024 3:14 PM    [facility] [address] [place], [place], [place]  [address]  Phone: [phone]; Fax: [phone]

## 2025-03-18 ENCOUNTER — Ambulatory Visit (HOSPITAL_COMMUNITY)
